# Patient Record
Sex: Female | Born: 1945 | Race: White | Hispanic: No | Marital: Married | Smoking: Former smoker
Health system: Southern US, Community
[De-identification: ages and names within clinical notes are randomized; demographics above are authoritative.]

## PROBLEM LIST (undated history)

## (undated) DIAGNOSIS — J45909 Unspecified asthma, uncomplicated: Secondary | ICD-10-CM

## (undated) DIAGNOSIS — E039 Hypothyroidism, unspecified: Secondary | ICD-10-CM

## (undated) DIAGNOSIS — C801 Malignant (primary) neoplasm, unspecified: Secondary | ICD-10-CM

## (undated) DIAGNOSIS — I480 Paroxysmal atrial fibrillation: Secondary | ICD-10-CM

## (undated) DIAGNOSIS — T8859XA Other complications of anesthesia, initial encounter: Secondary | ICD-10-CM

## (undated) DIAGNOSIS — D649 Anemia, unspecified: Secondary | ICD-10-CM

## (undated) DIAGNOSIS — Z9889 Other specified postprocedural states: Secondary | ICD-10-CM

## (undated) DIAGNOSIS — I499 Cardiac arrhythmia, unspecified: Secondary | ICD-10-CM

## (undated) DIAGNOSIS — R030 Elevated blood-pressure reading, without diagnosis of hypertension: Secondary | ICD-10-CM

## (undated) DIAGNOSIS — I10 Essential (primary) hypertension: Secondary | ICD-10-CM

## (undated) DIAGNOSIS — E785 Hyperlipidemia, unspecified: Secondary | ICD-10-CM

## (undated) DIAGNOSIS — T884XXA Failed or difficult intubation, initial encounter: Secondary | ICD-10-CM

## (undated) HISTORY — PX: KNEE SURGERY: SHX244

## (undated) HISTORY — PX: RIGHT COLECTOMY: SHX853

## (undated) HISTORY — DX: Hyperlipidemia, unspecified: E78.5

## (undated) HISTORY — DX: Elevated blood-pressure reading, without diagnosis of hypertension: R03.0

## (undated) HISTORY — PX: LEG SURGERY: SHX1003

## (undated) HISTORY — DX: Paroxysmal atrial fibrillation: I48.0

## (undated) HISTORY — PX: CHOLECYSTECTOMY: SHX55

## (undated) HISTORY — PX: THYROID SURGERY: SHX805

## (undated) HISTORY — DX: Hypothyroidism, unspecified: E03.9

---

## 1997-07-25 ENCOUNTER — Ambulatory Visit (HOSPITAL_COMMUNITY): Admission: RE | Admit: 1997-07-25 | Discharge: 1997-07-26 | Payer: Self-pay | Admitting: General Surgery

## 1997-07-27 ENCOUNTER — Inpatient Hospital Stay (HOSPITAL_COMMUNITY): Admission: EM | Admit: 1997-07-27 | Discharge: 1997-07-29 | Payer: Self-pay | Admitting: Emergency Medicine

## 1997-09-09 ENCOUNTER — Ambulatory Visit (HOSPITAL_COMMUNITY): Admission: RE | Admit: 1997-09-09 | Discharge: 1997-09-09 | Payer: Self-pay | Admitting: Obstetrics & Gynecology

## 2000-03-13 ENCOUNTER — Encounter: Payer: Self-pay | Admitting: Obstetrics and Gynecology

## 2000-03-13 ENCOUNTER — Encounter: Admission: RE | Admit: 2000-03-13 | Discharge: 2000-03-13 | Payer: Self-pay | Admitting: Obstetrics and Gynecology

## 2000-04-30 ENCOUNTER — Other Ambulatory Visit: Admission: RE | Admit: 2000-04-30 | Discharge: 2000-04-30 | Payer: Self-pay | Admitting: Obstetrics and Gynecology

## 2002-02-10 ENCOUNTER — Emergency Department (HOSPITAL_COMMUNITY): Admission: EM | Admit: 2002-02-10 | Discharge: 2002-02-10 | Payer: Self-pay | Admitting: Emergency Medicine

## 2002-02-20 ENCOUNTER — Emergency Department (HOSPITAL_COMMUNITY): Admission: EM | Admit: 2002-02-20 | Discharge: 2002-02-20 | Payer: Self-pay | Admitting: Emergency Medicine

## 2002-02-20 ENCOUNTER — Encounter: Payer: Self-pay | Admitting: Emergency Medicine

## 2002-04-23 ENCOUNTER — Emergency Department (HOSPITAL_COMMUNITY): Admission: EM | Admit: 2002-04-23 | Discharge: 2002-04-23 | Payer: Self-pay | Admitting: Emergency Medicine

## 2002-06-12 ENCOUNTER — Inpatient Hospital Stay (HOSPITAL_COMMUNITY): Admission: AD | Admit: 2002-06-12 | Discharge: 2002-06-12 | Payer: Self-pay | Admitting: Obstetrics and Gynecology

## 2002-10-25 ENCOUNTER — Other Ambulatory Visit: Admission: RE | Admit: 2002-10-25 | Discharge: 2002-10-25 | Payer: Self-pay | Admitting: Obstetrics and Gynecology

## 2002-11-18 ENCOUNTER — Encounter: Admission: RE | Admit: 2002-11-18 | Discharge: 2002-11-18 | Payer: Self-pay | Admitting: Obstetrics and Gynecology

## 2002-11-18 ENCOUNTER — Encounter: Payer: Self-pay | Admitting: Obstetrics and Gynecology

## 2003-06-16 ENCOUNTER — Emergency Department (HOSPITAL_COMMUNITY): Admission: AD | Admit: 2003-06-16 | Discharge: 2003-06-16 | Payer: Self-pay | Admitting: Emergency Medicine

## 2003-10-29 ENCOUNTER — Inpatient Hospital Stay (HOSPITAL_COMMUNITY): Admission: EM | Admit: 2003-10-29 | Discharge: 2003-11-02 | Payer: Self-pay | Admitting: *Deleted

## 2003-10-31 ENCOUNTER — Encounter: Payer: Self-pay | Admitting: Cardiology

## 2004-03-02 ENCOUNTER — Emergency Department (HOSPITAL_COMMUNITY): Admission: EM | Admit: 2004-03-02 | Discharge: 2004-03-02 | Payer: Self-pay | Admitting: Emergency Medicine

## 2004-05-04 ENCOUNTER — Emergency Department (HOSPITAL_COMMUNITY): Admission: EM | Admit: 2004-05-04 | Discharge: 2004-05-04 | Payer: Self-pay | Admitting: Emergency Medicine

## 2004-05-07 ENCOUNTER — Ambulatory Visit: Payer: Self-pay | Admitting: Cardiology

## 2004-06-21 ENCOUNTER — Ambulatory Visit: Payer: Self-pay | Admitting: Cardiology

## 2004-07-18 ENCOUNTER — Other Ambulatory Visit: Admission: RE | Admit: 2004-07-18 | Discharge: 2004-07-18 | Payer: Self-pay | Admitting: Obstetrics and Gynecology

## 2004-07-25 ENCOUNTER — Encounter: Admission: RE | Admit: 2004-07-25 | Discharge: 2004-07-25 | Payer: Self-pay | Admitting: Obstetrics and Gynecology

## 2005-03-10 ENCOUNTER — Emergency Department (HOSPITAL_COMMUNITY): Admission: EM | Admit: 2005-03-10 | Discharge: 2005-03-10 | Payer: Self-pay | Admitting: Emergency Medicine

## 2005-07-29 ENCOUNTER — Encounter: Admission: RE | Admit: 2005-07-29 | Discharge: 2005-07-29 | Payer: Self-pay | Admitting: Obstetrics and Gynecology

## 2005-08-06 ENCOUNTER — Other Ambulatory Visit: Admission: RE | Admit: 2005-08-06 | Discharge: 2005-08-06 | Payer: Self-pay | Admitting: Obstetrics and Gynecology

## 2005-08-19 ENCOUNTER — Encounter: Admission: RE | Admit: 2005-08-19 | Discharge: 2005-08-19 | Payer: Self-pay | Admitting: Obstetrics and Gynecology

## 2005-11-12 ENCOUNTER — Ambulatory Visit: Payer: Self-pay | Admitting: Internal Medicine

## 2005-11-20 ENCOUNTER — Ambulatory Visit: Payer: Self-pay | Admitting: Internal Medicine

## 2005-11-28 ENCOUNTER — Ambulatory Visit: Payer: Self-pay | Admitting: Internal Medicine

## 2005-12-10 ENCOUNTER — Ambulatory Visit: Payer: Self-pay | Admitting: Cardiovascular Disease

## 2005-12-17 ENCOUNTER — Ambulatory Visit: Payer: Self-pay | Admitting: Gastroenterology

## 2006-01-01 ENCOUNTER — Ambulatory Visit: Payer: Self-pay | Admitting: Gastroenterology

## 2006-08-14 ENCOUNTER — Encounter: Admission: RE | Admit: 2006-08-14 | Discharge: 2006-08-14 | Payer: Self-pay | Admitting: Orthopaedic Surgery

## 2006-09-24 ENCOUNTER — Other Ambulatory Visit: Admission: RE | Admit: 2006-09-24 | Discharge: 2006-09-24 | Payer: Self-pay | Admitting: Obstetrics and Gynecology

## 2006-10-29 ENCOUNTER — Encounter: Admission: RE | Admit: 2006-10-29 | Discharge: 2006-10-29 | Payer: Self-pay | Admitting: Obstetrics and Gynecology

## 2007-11-17 ENCOUNTER — Encounter: Admission: RE | Admit: 2007-11-17 | Discharge: 2007-11-17 | Payer: Self-pay | Admitting: Obstetrics and Gynecology

## 2007-12-12 ENCOUNTER — Emergency Department (HOSPITAL_COMMUNITY): Admission: EM | Admit: 2007-12-12 | Discharge: 2007-12-12 | Payer: Self-pay | Admitting: Emergency Medicine

## 2008-01-15 ENCOUNTER — Ambulatory Visit: Payer: Self-pay | Admitting: Cardiology

## 2008-01-15 LAB — CONVERTED CEMR LAB
BUN: 12 mg/dL (ref 6–23)
Basophils Absolute: 0.1 10*3/uL (ref 0.0–0.1)
Basophils Relative: 0.9 % (ref 0.0–3.0)
CO2: 31 meq/L (ref 19–32)
Calcium: 9 mg/dL (ref 8.4–10.5)
Chloride: 105 meq/L (ref 96–112)
Creatinine, Ser: 0.7 mg/dL (ref 0.4–1.2)
Eosinophils Absolute: 0.2 10*3/uL (ref 0.0–0.7)
Eosinophils Relative: 1.8 % (ref 0.0–5.0)
GFR calc Af Amer: 110 mL/min
GFR calc non Af Amer: 91 mL/min
Glucose, Bld: 94 mg/dL (ref 70–99)
HCT: 39.4 % (ref 36.0–46.0)
Hemoglobin: 13 g/dL (ref 12.0–15.0)
Lymphocytes Relative: 34.9 % (ref 12.0–46.0)
MCHC: 33 g/dL (ref 30.0–36.0)
MCV: 90.5 fL (ref 78.0–100.0)
Monocytes Absolute: 0.6 10*3/uL (ref 0.1–1.0)
Monocytes Relative: 7.5 % (ref 3.0–12.0)
Neutro Abs: 4.7 10*3/uL (ref 1.4–7.7)
Neutrophils Relative %: 54.9 % (ref 43.0–77.0)
Platelets: 242 10*3/uL (ref 150–400)
Potassium: 4.1 meq/L (ref 3.5–5.1)
Pro B Natriuretic peptide (BNP): 122 pg/mL — ABNORMAL HIGH (ref 0.0–100.0)
RBC: 4.35 M/uL (ref 3.87–5.11)
RDW: 13 % (ref 11.5–14.6)
Sodium: 141 meq/L (ref 135–145)
TSH: 2.08 microintl units/mL (ref 0.35–5.50)
WBC: 8.6 10*3/uL (ref 4.5–10.5)

## 2008-01-20 ENCOUNTER — Ambulatory Visit: Payer: Self-pay

## 2008-05-26 ENCOUNTER — Ambulatory Visit: Payer: Self-pay | Admitting: Cardiology

## 2008-05-27 ENCOUNTER — Observation Stay (HOSPITAL_COMMUNITY): Admission: EM | Admit: 2008-05-27 | Discharge: 2008-05-27 | Payer: Self-pay | Admitting: Emergency Medicine

## 2008-06-15 ENCOUNTER — Ambulatory Visit: Payer: Self-pay | Admitting: Cardiology

## 2008-09-15 DIAGNOSIS — I10 Essential (primary) hypertension: Secondary | ICD-10-CM | POA: Insufficient documentation

## 2008-09-15 DIAGNOSIS — E039 Hypothyroidism, unspecified: Secondary | ICD-10-CM | POA: Insufficient documentation

## 2008-09-15 DIAGNOSIS — E785 Hyperlipidemia, unspecified: Secondary | ICD-10-CM | POA: Insufficient documentation

## 2008-09-15 DIAGNOSIS — E782 Mixed hyperlipidemia: Secondary | ICD-10-CM | POA: Insufficient documentation

## 2008-09-15 DIAGNOSIS — I480 Paroxysmal atrial fibrillation: Secondary | ICD-10-CM | POA: Insufficient documentation

## 2008-09-15 DIAGNOSIS — I4891 Unspecified atrial fibrillation: Secondary | ICD-10-CM | POA: Insufficient documentation

## 2008-09-15 HISTORY — DX: Hypothyroidism, unspecified: E03.9

## 2008-09-16 ENCOUNTER — Ambulatory Visit: Payer: Self-pay | Admitting: Cardiology

## 2008-09-16 DIAGNOSIS — E669 Obesity, unspecified: Secondary | ICD-10-CM | POA: Insufficient documentation

## 2009-03-03 ENCOUNTER — Encounter: Admission: RE | Admit: 2009-03-03 | Discharge: 2009-03-03 | Payer: Self-pay | Admitting: Obstetrics and Gynecology

## 2009-04-24 ENCOUNTER — Other Ambulatory Visit: Admission: RE | Admit: 2009-04-24 | Discharge: 2009-04-24 | Payer: Self-pay | Admitting: Obstetrics and Gynecology

## 2009-04-24 ENCOUNTER — Ambulatory Visit: Payer: Self-pay | Admitting: Obstetrics and Gynecology

## 2009-07-31 ENCOUNTER — Ambulatory Visit: Payer: Self-pay | Admitting: Vascular Surgery

## 2009-07-31 ENCOUNTER — Emergency Department (HOSPITAL_COMMUNITY): Admission: EM | Admit: 2009-07-31 | Discharge: 2009-07-31 | Payer: Self-pay | Admitting: Emergency Medicine

## 2009-07-31 ENCOUNTER — Ambulatory Visit (HOSPITAL_COMMUNITY): Admission: RE | Admit: 2009-07-31 | Discharge: 2009-07-31 | Payer: Self-pay | Admitting: Emergency Medicine

## 2009-07-31 ENCOUNTER — Telehealth: Payer: Self-pay | Admitting: Cardiology

## 2009-07-31 ENCOUNTER — Encounter (INDEPENDENT_AMBULATORY_CARE_PROVIDER_SITE_OTHER): Payer: Self-pay | Admitting: Emergency Medicine

## 2009-09-18 ENCOUNTER — Ambulatory Visit: Payer: Self-pay | Admitting: Cardiology

## 2009-10-12 ENCOUNTER — Ambulatory Visit (HOSPITAL_COMMUNITY): Admission: RE | Admit: 2009-10-12 | Discharge: 2009-10-12 | Payer: Self-pay | Admitting: Cardiology

## 2009-10-12 ENCOUNTER — Encounter: Payer: Self-pay | Admitting: Cardiology

## 2009-10-12 ENCOUNTER — Ambulatory Visit: Payer: Self-pay | Admitting: Internal Medicine

## 2009-10-12 ENCOUNTER — Ambulatory Visit: Payer: Self-pay

## 2009-12-19 ENCOUNTER — Ambulatory Visit: Payer: Self-pay

## 2009-12-19 ENCOUNTER — Encounter: Payer: Self-pay | Admitting: Cardiology

## 2009-12-19 ENCOUNTER — Telehealth: Payer: Self-pay | Admitting: Cardiology

## 2009-12-19 DIAGNOSIS — R000 Tachycardia, unspecified: Secondary | ICD-10-CM | POA: Insufficient documentation

## 2009-12-21 ENCOUNTER — Telehealth (INDEPENDENT_AMBULATORY_CARE_PROVIDER_SITE_OTHER): Payer: Self-pay | Admitting: *Deleted

## 2010-01-30 ENCOUNTER — Telehealth (INDEPENDENT_AMBULATORY_CARE_PROVIDER_SITE_OTHER): Payer: Self-pay | Admitting: *Deleted

## 2010-03-06 ENCOUNTER — Encounter: Admission: RE | Admit: 2010-03-06 | Discharge: 2010-03-06 | Payer: Self-pay | Admitting: Obstetrics and Gynecology

## 2010-05-01 ENCOUNTER — Telehealth: Payer: Self-pay | Admitting: Cardiology

## 2010-05-04 ENCOUNTER — Telehealth: Payer: Self-pay | Admitting: Internal Medicine

## 2010-05-04 DIAGNOSIS — J302 Other seasonal allergic rhinitis: Secondary | ICD-10-CM | POA: Insufficient documentation

## 2010-05-04 DIAGNOSIS — J3089 Other allergic rhinitis: Secondary | ICD-10-CM

## 2010-05-08 ENCOUNTER — Encounter (INDEPENDENT_AMBULATORY_CARE_PROVIDER_SITE_OTHER): Payer: Self-pay | Admitting: *Deleted

## 2010-05-08 NOTE — Progress Notes (Signed)
Summary: refill--metoprolol  Medications Added METOPROLOL TARTRATE 25 MG TABS (METOPROLOL TARTRATE) Take 1 tablet by mouth twice a day       Phone Note Refill Request Message from:  Patient on July 31, 2009 9:11 AM  Refills Requested: Medication #1:  METOPROLOL TARTRATE 25 MG TABS Take 1 tablet by mouth twice a day Send to [facility] [phone]  Initial call taken by: Judie Grieve,  July 31, 2009 9:11 AM  Follow-up for Phone Call        Rx sent into pharmacy. Pt notified. Marrion Coy, CNA  July 31, 2009 2:35 PM  Follow-up by: Marrion Coy, CNA,  July 31, 2009 2:35 PM    New/Updated Medications: METOPROLOL TARTRATE 25 MG TABS (METOPROLOL TARTRATE) Take 1 tablet by mouth twice a day Prescriptions: METOPROLOL TARTRATE 25 MG TABS (METOPROLOL TARTRATE) Take 1 tablet by mouth twice a day  #60 x 6   Entered by:   Marrion Coy, CNA   Authorized by:   Rollene Rotunda, MD, [facility]   Signed by:   Marrion Coy, CNA on 07/31/2009   Method used:   Electronically to        [facility] [phone]* (retail)       [address]       [place], [place]  [address]       Ph: [phone]       Fax: [phone]   RxID:   [id number]

## 2010-05-08 NOTE — Assessment & Plan Note (Signed)
Summary: f38m  Medications Added PRILOSEC 20 MG CPDR (OMEPRAZOLE) daily ASPIRIN 325 MG  TABS (ASPIRIN) daily      Allergies Added: ! PENICILLIN ! * ARYTHROMYCIN ! PHENERGAN ! * ALEVE  Visit Type:  Follow-up Primary Provider:  Oliver Barre, MD  CC:  Atrial Fibrillation.  History of Present Illness: The patient presents for follow up of her atrial fibrillation.  Since I last saw her she has had no symptomatic tachypalpitations. She has had no presyncope or syncope. She denies any chest pain or shortness of breath. She has no PND or orthopnea. She does some walking for exercise and denies any cardiovascular symptoms. Of note her blood pressure was elevated at the last appointment but as reported below is well-controlled today. She remains on the medications as listed.  Current Medications (verified): 1)  Prilosec 20 Mg Cpdr (Omeprazole) .... Daily 2)  Metoprolol Tartrate 25 Mg Tabs (Metoprolol Tartrate) .... Take 1 Tablet By Mouth Twice A Day 3)  Aspirin 325 Mg  Tabs (Aspirin) .... Daily  Allergies (verified): 1)  ! Penicillin 2)  ! * Arythromycin 3)  ! Phenergan 4)  ! * Aleve  Past History:  Past Medical History: Reviewed history from 09/15/2008 and no changes required.  1. Paroxysmal atrial fibrillation.   2. Borderline hypertension.   3. Borderline dyslipidemia.   4. History of hypothyroidism status post PTU therapy in the past.   5. Thyroid surgery.   6. Cholecystectomy.   7. Hysterectomy.   8. Benign left leg tumor resected.   9. Cesarean section.   Past Surgical History: Reviewed history from 09/15/2008 and no changes required.  Thyroid surgery  Cholecystectomy  Hysterectomy.   Benign left leg tumor resected  Cesarean section  Review of Systems       As stated in the HPI and negative for all other systems.   Vital Signs:  Patient profile:   65 year old female Height:      60 inches Weight:      226 pounds BMI:     44.30 Pulse rate:   48 / minute Resp:      16 per minute BP sitting:   110 / 70  (right arm)  Vitals Entered By: Marrion Coy, CNA (September 16, 2008 10:12 AM)  Nutrition Counseling: Patient's BMI is greater than 25 and therefore counseled on weight management options.  Physical Exam  General:  Well developed, well nourished, in no acute distress. Head:  normocephalic and atraumatic Eyes:  PERRLA/EOM intact; conjunctiva and lids normal. Mouth:  Teeth, gums and palate normal. Oral mucosa normal. Neck:  Neck supple, no JVD. No masses, thyromegaly or abnormal cervical nodes. Chest Wall:  no deformities or breast masses noted Lungs:  Clear bilaterally to auscultation and percussion. Heart:  Non-displaced PMI, chest non-tender; regular rate and rhythm, S1, S2 without murmurs, rubs or gallops. Carotid upstroke normal, no bruit. Normal abdominal aortic size, no bruits. Femorals normal pulses, no bruits. Pedals normal pulses. No edema, no varicosities. Abdomen:  Bowel sounds positive; abdomen soft and non-tender without masses, organomegaly, or hernias noted. No hepatosplenomegaly,obese Msk:  Back normal, normal gait. Muscle strength and tone normal. Pulses:  pulses normal in all 4 extremities Extremities:  No clubbing or cyanosis. Neurologic:  Alert and oriented x 3. Skin:  Intact without lesions or rashes. Psych:  Normal affect.   EKG  Procedure date:  09/16/2008  Findings:      Sinus bradycardia, rate 48, axis within normal limits, intervals within normal  limits, no acute ST-T wave changes.  Impression & Recommendations:  Problem # 1:  ATRIAL FIBRILLATION, PAROXYSMAL (ICD-427.31) The patient has had no symptomatic paroxysms of A. fib. She understands that if she does she needs to present to the emergency room for a flecainide bolus (pill in pocket). She will otherwise remain on the meds as listed. Orders: EKG w/ Interpretation (93000)  Problem # 2:  HYPERTENSION (ICD-401.9) Her blood pressure is well controlled. She will  continue the meds as listed.  Problem # 3:  OBESITY, UNSPECIFIED (ICD-278.00) We had a discussion today and the patient understands the need to lose weight. She says she does not eat very many calories. I gave her some suggestions to include increased exercise.  Patient Instructions: 1)  Your physician recommends that you schedule a follow-up appointment in: 1 year 2)  Your physician recommends that you continue on your current medications as directed. Please refer to the Current Medication list given to you today.

## 2010-05-08 NOTE — Progress Notes (Signed)
Summary: Low B/P  COMING FOR EKG   Phone Note Call from Patient Call back at Home Phone [phone]   Caller: Patient Summary of Call: Pt B/P low 80/54 last night  Initial call taken by: Judie Grieve,  December 19, 2009 9:32 AM  Follow-up for Phone Call        took BP several times during the night,  heart was beating like crazy  99 - 105.  IRREGULAR  but not racing, this irregularity is not normal for her. wakes her up from her sleep She had taken Metoprolol as ordered having some dizziness today with position changes BP 113/64 now.  Will review with Dr Antoine Poche and call pt back with orders Follow-up by: Charolotte Capuchin, RN,  December 19, 2009 10:06 AM  Additional Follow-up for Phone Call Additional follow up Details #1::        Have the patient come in for an EKG. Additional Follow-up by: Rollene Rotunda, MD, [facility],  December 19, 2009 2:03 PM    Additional Follow-up for Phone Call Additional follow up Details #2::    pt aware and is coming for an EKG today.  Samuella Bruin, RN

## 2010-05-08 NOTE — Progress Notes (Signed)
Summary: monitor   Phone Note Outgoing Call   Action Taken: Phone Call Completed Summary of Call: The event monitor will be mail to pt by [employer] Initial call taken by: Marcos Eke,  December 21, 2009 12:38 PM

## 2010-05-08 NOTE — Progress Notes (Signed)
Summary: Monitor Results   Phone Note Outgoing Call   Call placed by: Charolotte Capuchin, RN,  January 30, 2010 5:36 PM Call placed to: Patient Details for Reason: monitor results Summary of Call: Called to review results of monitor with pt.  left message for pt to call back.  Results are noted to be NSR, Sinus Huston Foley Initial call taken by: Charolotte Capuchin, RN,  January 30, 2010 5:37 PM  Follow-up for Phone Call        pt rtn call from yesterday [phone] [name]  January 31, 2010 2:35 PM   pt aware Follow-up by: Charolotte Capuchin, RN,  January 31, 2010 5:26 PM

## 2010-05-08 NOTE — Assessment & Plan Note (Signed)
Summary: 1 yr rov 427.31 pfh,rn      Allergies Added:   Visit Type:  Follow-up Primary Provider:  Oliver Barre, MD  CC:   atrial fibrillation.  History of Present Illness:  The patient presents for followup of atrial fibrillation. She was in the hospital emergency room in late April with a rate of 153. I think a arrhythmia stopped before they can capture it on an EKG. I reviewed the ER records. It was presumably atrial fibrillation. This is her second episode since February. She feels it going fast. She is anxious with that. She doesn't have chest discomfort, neck or arm discomfort. She doesn't have shortness of breath, PND or orthopnea. She doesn't have presyncope or syncope.  She has had no further episodes since that time. I did review her February hospitalization and noted her TSH was normal then.  Current Medications (verified): 1)  Prilosec 20 Mg Cpdr (Omeprazole) .... Daily 2)  Metoprolol Tartrate 25 Mg Tabs (Metoprolol Tartrate) .... Take 1 Tablet By Mouth Twice A Day 3)  Aspirin 325 Mg  Tabs (Aspirin) .... Daily  Allergies (verified): 1)  ! Penicillin 2)  ! * Arythromycin 3)  ! Phenergan 4)  ! * Aleve  Past History:  Past Medical History: Reviewed history from 09/15/2008 and no changes required.  1. Paroxysmal atrial fibrillation.   2. Borderline hypertension.   3. Borderline dyslipidemia.   4. History of hypothyroidism status post PTU therapy in the past.   5. Thyroid surgery.   6. Cholecystectomy.   7. Hysterectomy.   8. Benign left leg tumor resected.   9. Cesarean section.   Past Surgical History: Reviewed history from 09/15/2008 and no changes required.  Thyroid surgery  Cholecystectomy  Hysterectomy.   Benign left leg tumor resected  Cesarean section  Review of Systems       As stated in the HPI and negative for all other systems.   Vital Signs:  Patient profile:   65 year old female Height:      60 inches Weight:      226 pounds BMI:      44.30 Pulse rate:   53 / minute Resp:     18 per minute BP sitting:   144 / 82  (right arm)  Vitals Entered By: Marrion Coy, CNA (September 18, 2009 11:38 AM)  Physical Exam  General:  Well developed, well nourished, in no acute distress. Head:  normocephalic and atraumatic Eyes:  PERRLA/EOM intact; conjunctiva and lids normal. Mouth:  Dentures, otherwise unremarkable Neck:  Neck supple, no JVD. No masses, thyromegaly or abnormal cervical nodes. Chest Wall:  no deformities or breast masses noted Lungs:  Clear bilaterally to auscultation and percussion. Heart:  Non-displaced PMI, chest non-tender; regular rate and rhythm, S1, S2 without murmurs, rubs or gallops. Carotid upstroke normal, no bruit. Normal abdominal aortic size, no bruits. Femorals normal pulses, no bruits. Pedals normal pulses. No edema, no varicosities. Abdomen:  Bowel sounds positive; abdomen soft and non-tender without masses, organomegaly, or hernias noted. No hepatosplenomegaly. Msk:  Back normal, normal gait. Muscle strength and tone normal. Pulses:  pulses normal in all 4 extremities Extremities:  No clubbing or cyanosis. Neurologic:  Alert and oriented x 3. Skin:  Intact without lesions or rashes. Cervical Nodes:  no significant adenopathy Axillary Nodes:  no significant adenopathy Inguinal Nodes:  no significant adenopathy Psych:  Normal affect.   EKG  Procedure date:  09/18/2009  Findings:      Sinus bradycardia, rate  51, axis within normal limits, intervals within normal limits, no acute ST-T wave changes.  Impression & Recommendations:  Problem # 1:  ATRIAL FIBRILLATION, PAROXYSMAL (ICD-427.31) She has had her now second paroxysm. I do not think this yet justifies antiarrhythmic therapy and we had a long discussion about this. I will get an echocardiogram in anticipation of potentially starting Flecainide in the future if she has another episode. She has had a stress test in 2009 and a catheterization in  2005. As mentioned TSH was normal in February. Orders: EKG w/ Interpretation (93000) Echocardiogram (Echo)  Problem # 2:  OBESITY, UNSPECIFIED (ICD-278.00) She understands the need to lose weight with diet and exercise.  Problem # 3:  HYPERTENSION (ICD-401.9) Blood pressure has not been elevated otherwise though it is borderline here today. I encouraged weight loss for blood pressure control. Orders: Echocardiogram (Echo)  Patient Instructions: 1)  Your physician recommends that you schedule a follow-up appointment after echo (we will call you) 2)  Your physician recommends that you continue on your current medications as directed. Please refer to the Current Medication list given to you today. 3)  Your physician has requested that you have an echocardiogram.  Echocardiography is a painless test that uses sound waves to create images of your heart. It provides your doctor with information about the size and shape of your heart and how well your heart's chambers and valves are working.  This procedure takes approximately one hour. There are no restrictions for this procedure. 4)  You have been diagnosed with atrial fibrillation.  Atrial fibrillation is a condition in which one of the upper chambers of the heart has extra electrical cells causing it to beat very fast.  Please see the handout/brochure given to you today for further information.

## 2010-05-08 NOTE — Assessment & Plan Note (Signed)
Summary: ekg  rapid heartbeat last night order for event monitor  pfh,rn  Nurse Visit   Vital Signs:  Patient profile:   65 year old female Pulse rate:   56 / minute Pulse rhythm:   regular Resp:     16 per minute BP sitting:   128 / 82  (right arm) Cuff size:   large  Vitals Entered By: Charolotte Capuchin, RN (December 19, 2009 3:19 PM) CC: irregular HB Pain Assessment Patient in pain? no      Comments pt had episode of racing heart beat last night that resolved on its own.  Per Dr Rollene Rotunda pt came in today for an EKG - sinus bradycardia 56.  pt is concerned that with her history she may be going in and out of A Fib.  Will review with Dr Antoine Poche and call pt with an orders.  Pt is agreeable and states understanding.  Reviewed with Dr Antoine Poche who ordered for pt to have an event monitor.  Pt will be called to have  this scheduled   Allergies: 1)  ! Penicillin 2)  ! * Arythromycin 3)  ! Phenergan 4)  ! * Aleve  Orders Added: 1)  EKG w/ Interpretation [93000] 2)  Event [Event]

## 2010-05-16 NOTE — Progress Notes (Signed)
Summary: pt calling to get and answer to her question   Phone Note Call from Patient Call back at Home Phone [phone] Call back at [phone]   Caller: Patient Reason for Call: Talk to Nurse, Talk to Doctor Summary of Call: The question she asked you she has not heard from you yet so she was calling to check and see if you had an answer yet Initial call taken by: Omer Jack,  May 04, 2010 3:07 PM  Follow-up for Phone Call        This should be addressed by her primary care physician. Follow-up by: Rollene Rotunda, MD, [facility],  May 07, 2010 2:58 PM  Additional Follow-up for Phone Call Additional follow up Details #1::        pt aware she should have this addressed by her primary care MD.  I will forward this information to Dr Oliver Barre for his review Additional Follow-up by: Charolotte Capuchin, RN,  May 07, 2010 3:32 PM  New Problems: ALLERGIC RHINITIS (ICD-477.9)   Additional Follow-up for Phone Call Additional follow up Details #2::    sorry, but I would not feel comfortable with this type of letter for work; I could refer her to allergy if pt wants Follow-up by: Corwin Levins MD,  May 07, 2010 5:04 PM  Additional Follow-up for Phone Call Additional follow up Details #3:: Details for Additional Follow-up Action Taken: left message on machine for pt to return my call. Margaret Pyle, CMA  May 08, 2010 10:49 AM   Pt is requesting referral to allergist [phone] or [phone] VM OK. Margaret Pyle, CMA  May 08, 2010 11:13 AM   New Problems: ALLERGIC RHINITIS (ICD-477.9)   referral done Corwin Levins MD  May 08, 2010 11:27 AM

## 2010-05-16 NOTE — Letter (Signed)
Summary: [facility] Consult Scheduled Letter  [facility]  [address] [place], [place] [address]   Phone: [phone]  Fax: [phone]      05/08/2010 MRN: [id number]  [facility] [address] [place], [place]  [address]    Dear Ms. Pia Mau,      We have scheduled an appointment for you.  At the recommendation of Dr.John, we have scheduled you a consult with Dr Maple Hudson on 06/12/10 at 10:30am.  Their phone number is [phone].  If this appointment day and time is not convenient for you, please feel free to call the office of the doctor you are being referred to at the number listed above and reschedule the appointment.     [facility] [address] [place], [place] [address] *Pulmonary Dept.2nd Floor*     Please give 24hr notice if you need to cancel/reschedule to avoid a $50.00 fee. Also bring insurance card and any co-pay due at time of visit    Thank you,  Patient Care Coordinator [facility]

## 2010-05-16 NOTE — Progress Notes (Signed)
Summary: pt needs letter re fish allergy sent to school  ASK [facility]   Phone Note Call from Patient   Caller: Patient [phone]  Reason for Call: Talk to Nurse Summary of Call: pt calling has question -pt works in school cafeteria-allergic to fish- needs statement re this-pls call  Initial call taken by: Glynda Jaeger,  May 01, 2010 1:59 PM  Follow-up for Phone Call        feels like her throat is going to close up from smelling it cook both  fish and/or shrimp.  was told before by Dr Lorenz Coaster years ago not to eat it anymore because she "broke out in hives".  Wants a note stating that she is allergic so that she doesn't have to be around it at work when they cook it. (therefore she will be out of work the days they cook fish at school)  Informed pt that I will have to discuss with Dr Antoine Poche if this would be OK or if he would prefer she go to an allergist. Follow-up by: Charolotte Capuchin, RN,  May 01, 2010 3:21 PM     Appended Document: pt needs letter re fish allergy sent to school  ASK [facility] needs to be addressed by her primary MD per Dr Antoine Poche

## 2010-06-12 ENCOUNTER — Encounter: Payer: Self-pay | Admitting: Internal Medicine

## 2010-06-12 ENCOUNTER — Other Ambulatory Visit: Payer: BC Managed Care – PPO

## 2010-06-12 ENCOUNTER — Encounter (INDEPENDENT_AMBULATORY_CARE_PROVIDER_SITE_OTHER): Payer: Self-pay | Admitting: *Deleted

## 2010-06-12 ENCOUNTER — Institutional Professional Consult (permissible substitution) (INDEPENDENT_AMBULATORY_CARE_PROVIDER_SITE_OTHER): Payer: BC Managed Care – PPO | Admitting: Internal Medicine

## 2010-06-12 DIAGNOSIS — T6191XA Toxic effect of unspecified seafood, accidental (unintentional), initial encounter: Secondary | ICD-10-CM

## 2010-06-12 DIAGNOSIS — J309 Allergic rhinitis, unspecified: Secondary | ICD-10-CM

## 2010-06-12 DIAGNOSIS — J45909 Unspecified asthma, uncomplicated: Secondary | ICD-10-CM

## 2010-06-12 DIAGNOSIS — T61781A Other shellfish poisoning, accidental (unintentional), initial encounter: Secondary | ICD-10-CM | POA: Insufficient documentation

## 2010-06-19 NOTE — Assessment & Plan Note (Signed)
Summary: allergies/mh   Primary Provider/Referring Provider:  Oliver Barre, MD  CC:  Allergy Consult-Dr. Jonny Ruiz.  History of Present Illness: June 12, 2010- 65 yoF referred by Dr Jonny Ruiz, concerned about possible food/ fish allergy. She worksas Scientist, physiological for [employer]. On days when fish/ seafood is served she notes symptoms. For several years as an adult, the odor of cooking fish makes her skin itch. Ate fried fish many years ago and broke out in hives. Throat may get tight, harder to swallow. Discomfort fades over 15-20 minutes. Avoids fish and will call in to get off work when fresh fish is being solved.  dogs cause itching, hives and wheeze. Told once she might have "a touch" of asthma.  Heavy pollen causes sneeze, eyes itch and water. Hx tacycardia. She is reluctant to carry Epipen.    Preventive Screening-Counseling & Management  Alcohol-Tobacco     Smoking Status: quit     Packs/Day: 1/2ppd     Year Started: 1977     Year Quit: 1984     Pack years: 7  Current Medications (verified): 1)  Tums 500 Mg Chew (Calcium Carbonate Antacid) .... Tkae 1-2 By Mouth At Bedtime 2)  Metoprolol Tartrate 25 Mg Tabs (Metoprolol Tartrate) .... Take 1 Tablet By Mouth Twice A Day 3)  Aspirin 325 Mg  Tabs (Aspirin) .... Daily  Allergies (verified): 1)  ! Penicillin 2)  ! * Arythromycin 3)  ! Phenergan 4)  ! * Aleve  Past History:  Past Medical History:  1. Paroxysmal atrial fibrillation.   2. Borderline hypertension.   3. Borderline dyslipidemia.   4. History of hypothyroidism status post PTU therapy in the past.   5. Thyroid surgery.   6. Cholecystectomy.   7. Hysterectomy.   8. Benign left leg tumor resected.   9. Cesarean section.  10, ?Food Allergy- seafood  Social History: Smoking Status:  quit Pack years:  7 Packs/Day:  1/2ppd  Review of Systems       The patient complains of irregular heartbeats, acid heartburn, indigestion, and hand/feet swelling.  The patient denies  shortness of breath with activity, shortness of breath at rest, productive cough, non-productive cough, coughing up blood, chest pain, loss of appetite, weight change, abdominal pain, difficulty swallowing, sore throat, tooth/dental problems, headaches, nasal congestion/difficulty breathing through nose, sneezing, itching, ear ache, anxiety, depression, joint stiffness or pain, rash, change in color of mucus, and fever.    Vital Signs:  Patient profile:   65 year old female Height:      60 inches Weight:      232.13 pounds BMI:     45.50 O2 Sat:      98 % on Room air Pulse rate:   50 / minute BP sitting:   134 / 80  (left arm) Cuff size:   large  Vitals Entered By: Reynaldo Minium CMA (June 12, 2010 10:58 AM)  O2 Flow:  Room air CC: Allergy Consult-Dr. Jonny Ruiz   Physical Exam  Additional Exam:  General: A/Ox3; pleasant and cooperative, NAD, overweight SKIN: no rash, lesions. No dermographism NODES: no lymphadenopathy HEENT: [place]/AT, EOM- WNL, Conjuctivae- clear, PERRLA, TM-WNL, Nose- clear, Throat- clear and wnl, dentures, Mallampati  II NECK: Supple w/ fair ROM, JVD- none, normal carotid impulses w/o bruits Thyroid- normal to palpation CHEST: Clear to P&A. 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 We will test for IgE mediated allergic responses in vitro today, with return for skin testing. She is concerned about Epipen use given her hx of tachyarryhthmia. Will suggest preventative antihistamine use and give pateint information on food allergy/ anaphyllaxis. Return for allergy testing.   Problem # 2:  ASTHMA (ICD-493.90) Hx of mild wheeze. Since  she was wheezing at this meeting, it is unclear she actually does measure her airflow.   Problem # 3:  ALLERGIC RHINITIS (ICD-477.9)  Orders: Consultation Level IV (21308)  Medications Added to Medication List This Visit: 1)  Tums 500 Mg Chew (Calcium carbonate antacid) .... Tkae 1-2 by mouth at bedtime  Other Orders: T-Allergy Profile Region [place], [place], [place], [place], [place] [phone]) T-Food Allergy Profile Specific IgE (86003/[id number])  Patient Instructions: 1)  Return as able for allergy skin testing. Stop all antihistamines 3 days before skin testing, including cold and allergy meds, otc sleep and cough meds.  2)  Except for the days leading up to allergy testing, you can try taking a daily antihistamine, like otc loratadine, as a little protection against unexpected exposures.  3)  Patient information- food allergy 4)  lab

## 2010-06-20 ENCOUNTER — Encounter: Payer: Self-pay | Admitting: Internal Medicine

## 2010-06-20 LAB — CONVERTED CEMR LAB
IgE (Immunoglobulin E), Serum: 1052 intl units/mL — ABNORMAL HIGH (ref 0.0–180.0)
IgE (Immunoglobulin E), Serum: 951.7 intl units/mL — ABNORMAL HIGH (ref 0.0–180.0)

## 2010-06-26 LAB — BASIC METABOLIC PANEL
BUN: 12 mg/dL (ref 6–23)
CO2: 29 mEq/L (ref 19–32)
Calcium: 8.8 mg/dL (ref 8.4–10.5)
Chloride: 105 mEq/L (ref 96–112)
Creatinine, Ser: 0.68 mg/dL (ref 0.4–1.2)
GFR calc Af Amer: >60 mL/min (ref >60)
GFR calc non Af Amer: >60 mL/min (ref >60)
Glucose, Bld: 112 mg/dL — ABNORMAL HIGH (ref 70–99)
Potassium: 3.3 mEq/L — ABNORMAL LOW (ref 3.5–5.1)
Sodium: 140 mEq/L (ref 135–145)

## 2010-06-26 LAB — POCT CARDIAC MARKERS
CKMB, poc: <1 ng/mL — ABNORMAL LOW (ref 1.0–8.0)
Myoglobin, poc: 62.1 ng/mL (ref 12–200)
Troponin i, poc: <0.05 ng/mL (ref 0.00–0.09)

## 2010-06-26 LAB — CBC
HCT: 38.4 % (ref 36.0–46.0)
Hemoglobin: 12.9 g/dL (ref 12.0–15.0)
MCHC: 33.6 g/dL (ref 30.0–36.0)
MCV: 90.7 fL (ref 78.0–100.0)
Platelets: 215 10*3/uL (ref 150–400)
RBC: 4.24 MIL/uL (ref 3.87–5.11)
RDW: 13.4 % (ref 11.5–15.5)
WBC: 11.6 10*3/uL — ABNORMAL HIGH (ref 4.0–10.5)

## 2010-06-26 LAB — D-DIMER, QUANTITATIVE (NOT AT ARMC): D-Dimer, Quant: 0.43 ug/mL-FEU (ref 0.00–0.48)

## 2010-06-26 LAB — DIFFERENTIAL
Basophils Absolute: 0.2 10*3/uL — ABNORMAL HIGH (ref 0.0–0.1)
Basophils Relative: 2 % — ABNORMAL HIGH (ref 0–1)
Eosinophils Absolute: 0.2 10*3/uL (ref 0.0–0.7)
Eosinophils Relative: 2 % (ref 0–5)
Lymphocytes Relative: 37 % (ref 12–46)
Lymphs Abs: 4.3 10*3/uL — ABNORMAL HIGH (ref 0.7–4.0)
Monocytes Absolute: 0.8 10*3/uL (ref 0.1–1.0)
Monocytes Relative: 7 % (ref 3–12)
Neutro Abs: 6.1 10*3/uL (ref 1.7–7.7)
Neutrophils Relative %: 53 % (ref 43–77)

## 2010-06-26 LAB — PROTIME-INR
INR: 0.98 (ref 0.00–1.49)
Prothrombin Time: 12.9 seconds (ref 11.6–15.2)

## 2010-06-26 LAB — APTT: aPTT: 29 seconds (ref 24–37)

## 2010-06-26 NOTE — Letter (Signed)
Summary: [facility]  [address]   [place], [place] [address]   Phone: [phone]  Fax: [phone]    06/20/2010  Saide Milone [address] [place], [place]  [address]  [place]   To Whom it may concern:     This letter is at the request of Mrs. Harron. She is allergic to Fish(all types) and needs to take precaution when around these particular foods. Please take this into consideration. If any questions or concerns please call our office at [phone].         Sincerely,       Jason Coop

## 2010-07-10 ENCOUNTER — Encounter: Payer: Self-pay | Admitting: Internal Medicine

## 2010-07-11 ENCOUNTER — Encounter: Payer: Self-pay | Admitting: Internal Medicine

## 2010-07-11 ENCOUNTER — Ambulatory Visit (INDEPENDENT_AMBULATORY_CARE_PROVIDER_SITE_OTHER): Payer: BC Managed Care – PPO | Admitting: Internal Medicine

## 2010-07-11 DIAGNOSIS — J309 Allergic rhinitis, unspecified: Secondary | ICD-10-CM

## 2010-07-11 DIAGNOSIS — J45909 Unspecified asthma, uncomplicated: Secondary | ICD-10-CM

## 2010-07-11 DIAGNOSIS — T6191XA Toxic effect of unspecified seafood, accidental (unintentional), initial encounter: Secondary | ICD-10-CM

## 2010-07-11 NOTE — Patient Instructions (Signed)
See [facility] to start application process for Xolair  Patient information sheet on Xolair  Continue to avoid exposure to fish/ seafood since you find they bother you.   You can take anihihistamines like loratadine or fexofenadine as needed  It may be best to avoid prolonged time outdoors now while pollen is so high.

## 2010-07-11 NOTE — Progress Notes (Signed)
  Subjective:    Patient ID: [name], female    DOB: 1946-03-10, 65 y.o.   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 Here today for allergy testing.  Allergy profile broadly positive including some fish/ shrimp.  Off anthiistamines she notes sneeze in early AM and at night- when she sits out on porch. Eyes itch, nose waters, postnasal drip. Wheezes a little at night, less than in past, but especially as she lies down.  Skin test- Pos grass, trees, dust, and fish, shell fish, and some incidental additional foods.  She got note and will stay out of work when fish served and she says this as helped a lot. Discussed allergy vaccine, xolair.  Review of Systems See HPI, otherwise negative today    Objective:   Physical Exam General- Alert, Oriented, Affect-appropriate, Distress- none acute  Skin- rash-none, lesions- none, excoriation- none  Lymphadenopathy- none  Head- atraumatic  Eyes- Gross vision intact, PERRLA, conjunctivae clear secretions  Ears- Normal- Hearing, canals, Tm L ,   R ,  Nose- clear mucus bridging, No- Septal dev, , polyps, erosion, perforation   Throat- Mallampati II , mucosa clear , drainage- none, tonsils- atrophic  Neck- flexible , trachea midline, no stridor , thyroid nl, carotid no bruit  Chest - symmetrical excursion , unlabored     Heart/CV- RRR , no murmur , no gallop  , no rub, nl s1 s2                     - JVD- none , edema- none, stasis changes- none, varices- none     Lung- clear to P&A, wheeze- none, cough- none , dullness-none, rub- none     Chest wall- atraumatic, no scar  Abd- tender-no, distended-no, bowel sounds-present, HSM- no  Br/ Gen/ Rectal- Not done, not indicated  Extrem- cyanosis- none, clubbing, none, atrophy- none, strength- nl  Neuro- grossly intact to  observation  overweight, neg    Try to bring note forward   Assessment & Plan:

## 2010-07-17 ENCOUNTER — Encounter: Payer: Self-pay | Admitting: Internal Medicine

## 2010-07-17 MED ORDER — OMALIZUMAB 150 MG ~~LOC~~ SOLR: SUBCUTANEOUS | Status: DC

## 2010-07-17 NOTE — Assessment & Plan Note (Signed)
Broad and high in vitro IgE levels and elevated food skin tests support recommendation that she avoid foods causing symptoms, carry an antihistamine. Consider Epipen. Keep a food diary to help recognize which exposures may not be significant.

## 2010-07-17 NOTE — Assessment & Plan Note (Addendum)
Mild imntermittent asthma, not needing rescue inhaler now. Gven her definite allergic component, we discussed possibility she might qualify for a trial of Xolair.

## 2010-07-17 NOTE — Assessment & Plan Note (Signed)
Educated on environmental dust and pollen control measures.

## 2010-07-24 LAB — ABO/RH: ABO/RH(D): O NEG

## 2010-07-24 LAB — TROPONIN I: Troponin I: <0.01 ng/mL (ref 0.00–0.06)

## 2010-07-24 LAB — CARDIAC PANEL(CRET KIN+CKTOT+MB+TROPI)
CK, MB: 1.1 ng/mL (ref 0.3–4.0)
Relative Index: INVALID (ref 0.0–2.5)
Total CK: 21 U/L (ref 7–177)
Troponin I: <0.01 ng/mL (ref 0.00–0.06)

## 2010-07-24 LAB — DIFFERENTIAL
Basophils Absolute: 0.1 10*3/uL (ref 0.0–0.1)
Basophils Relative: 1 % (ref 0–1)
Eosinophils Absolute: 0.2 10*3/uL (ref 0.0–0.7)
Eosinophils Relative: 2 % (ref 0–5)
Lymphocytes Relative: 33 % (ref 12–46)
Lymphs Abs: 3.1 10*3/uL (ref 0.7–4.0)
Monocytes Absolute: 0.8 10*3/uL (ref 0.1–1.0)
Monocytes Relative: 8 % (ref 3–12)
Neutro Abs: 5.4 10*3/uL (ref 1.7–7.7)
Neutrophils Relative %: 57 % (ref 43–77)

## 2010-07-24 LAB — POCT I-STAT, CHEM 8
BUN: 8 mg/dL (ref 6–23)
Calcium, Ion: 1.13 mmol/L (ref 1.12–1.32)
Chloride: 105 mEq/L (ref 96–112)
Creatinine, Ser: 0.5 mg/dL (ref 0.4–1.2)
Glucose, Bld: 115 mg/dL — ABNORMAL HIGH (ref 70–99)
HCT: 42 % (ref 36.0–46.0)
Hemoglobin: 14.3 g/dL (ref 12.0–15.0)
Potassium: 3.4 mEq/L — ABNORMAL LOW (ref 3.5–5.1)
Sodium: 144 mEq/L (ref 135–145)
TCO2: 30 mmol/L (ref 0–100)

## 2010-07-24 LAB — BASIC METABOLIC PANEL
BUN: 7 mg/dL (ref 6–23)
CO2: 29 mEq/L (ref 19–32)
Calcium: 8.3 mg/dL — ABNORMAL LOW (ref 8.4–10.5)
Chloride: 108 mEq/L (ref 96–112)
Creatinine, Ser: 0.69 mg/dL (ref 0.4–1.2)
GFR calc Af Amer: >60 mL/min (ref >60)
GFR calc non Af Amer: >60 mL/min (ref >60)
Glucose, Bld: 115 mg/dL — ABNORMAL HIGH (ref 70–99)
Potassium: 3.8 mEq/L (ref 3.5–5.1)
Sodium: 143 mEq/L (ref 135–145)

## 2010-07-24 LAB — CBC
HCT: 35.3 % — ABNORMAL LOW (ref 36.0–46.0)
HCT: 39.5 % (ref 36.0–46.0)
Hemoglobin: 12 g/dL (ref 12.0–15.0)
Hemoglobin: 13.4 g/dL (ref 12.0–15.0)
MCHC: 34.1 g/dL (ref 30.0–36.0)
MCHC: 34.1 g/dL (ref 30.0–36.0)
MCV: 89.8 fL (ref 78.0–100.0)
MCV: 89.9 fL (ref 78.0–100.0)
Platelets: 221 10*3/uL (ref 150–400)
Platelets: 264 10*3/uL (ref 150–400)
RBC: 3.93 MIL/uL (ref 3.87–5.11)
RBC: 4.39 MIL/uL (ref 3.87–5.11)
RDW: 14.4 % (ref 11.5–15.5)
RDW: 14.5 % (ref 11.5–15.5)
WBC: 8.7 10*3/uL (ref 4.0–10.5)
WBC: 9.6 10*3/uL (ref 4.0–10.5)

## 2010-07-24 LAB — TYPE AND SCREEN
ABO/RH(D): O NEG
Antibody Screen: NEGATIVE

## 2010-07-24 LAB — CK TOTAL AND CKMB (NOT AT ARMC)
CK, MB: 1.2 ng/mL (ref 0.3–4.0)
Relative Index: INVALID (ref 0.0–2.5)
Total CK: 24 U/L (ref 7–177)

## 2010-07-24 LAB — HEMOGLOBIN A1C
Hgb A1c MFr Bld: 6 % (ref 4.6–6.1)
Mean Plasma Glucose: 126 mg/dL

## 2010-07-24 LAB — HEPARIN LEVEL (UNFRACTIONATED): Heparin Unfractionated: 0.1 IU/mL — ABNORMAL LOW (ref 0.30–0.70)

## 2010-07-24 LAB — MAGNESIUM: Magnesium: 2.1 mg/dL (ref 1.5–2.5)

## 2010-07-24 LAB — LIPID PANEL
Cholesterol: 149 mg/dL (ref 0–200)
HDL: 51 mg/dL (ref >39)
LDL Cholesterol: 87 mg/dL (ref 0–99)
Total CHOL/HDL Ratio: 2.9 RATIO
Triglycerides: 54 mg/dL (ref <150)
VLDL: 11 mg/dL (ref 0–40)

## 2010-07-24 LAB — TSH: TSH: 3.484 u[IU]/mL (ref 0.350–4.500)

## 2010-07-24 LAB — PROTIME-INR
INR: 0.9 (ref 0.00–1.49)
Prothrombin Time: 12.7 seconds (ref 11.6–15.2)

## 2010-07-30 ENCOUNTER — Encounter: Payer: Self-pay | Admitting: Internal Medicine

## 2010-08-21 NOTE — Discharge Summary (Signed)
NAMEGIUSEPPINA, [name]              ACCOUNT NO.:  [id number]   MEDICAL RECORD NO.:  [id number]          PATIENT TYPE:  INP   LOCATION:  3707                         FACILITY:  [facility]   PHYSICIAN:  Bevelyn Buckles. Bensimhon, MDDATE OF BIRTH:  06/18/1945   DATE OF ADMISSION:  05/27/2008  DATE OF DISCHARGE:  05/27/2008                               DISCHARGE SUMMARY   PRIMARY CARDIOLOGIST:  Rollene Rotunda, MD, [facility]   PRIMARY CARE Tyresa Prindiville:  Corwin Levins, MD   DISCHARGE DIAGNOSIS:  Paroxysmal atrial fibrillation with rapid  ventricular response.   SECONDARY DIAGNOSES:  1. Borderline hyperlipidemia.  2. Borderline hypertension.  3. Status post cholecystectomy.  4. History of hypothyroidism status post PTU therapy 2005.  5. Status post hysterectomy.  6. Status post cesarean section.  7. 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   ALLERGIES:  PENICILLIN, NAPROSYN, PHENERGAN and ERYTHROMYCIN.   PROCEDURES:  None.   HISTORY OF PRESENT ILLNESS:  A 65 year old married Caucasian female with  prior history of paroxysmal atrial fibrillation who typically  experiences AFib two to three times per year.  She was in usual state of  health until the early morning hours of May 24, 2008 when she awoke  with recurrent tachy palpitations and mild chest and epigastric  discomfort which she associates with atrial fibrillation.  She presented  to the [facility] ED where she was in AFib with RVR at a rate of 144  beats per minute.  Prior to leaving her home, she took a 25 mg of oral  metoprolol and she was given additional 50 mg while in the emergency  room.  She was also initiated on diltiazem infusion with subsequent  conversion to sinus rhythm.  Since converting to sinus, she has been  asymptomatic and her rates have actually been in the 40s to 50s.  She  normally would only take 25 mg b.i.d. of metoprolol at home.  Her  cardiac markers are negative and I plan to discharge her home  today in  good condition.  With her history of hypothyroidism, we have checked TSH  however, it is pending.  We have arranged followup with Dr. Antoine Poche on  June 15, 2008 at 4 p.m. for consideration of flecainide therapy that is  felt to be appropriate.   DISCHARGE LABORATORY DATA:  Hemoglobin 14.3, hematocrit 42.0, WBC 9.6,  platelets 264, INR 0.9.  Sodium 143, potassium 3.8, chloride 108, CO2  29, BUN 7, creatinine 0.69, glucose 115, magnesium 2.1, calcium 8.3, CK  24, MB 1.2, troponin I less than 0.01, total cholesterol 149,  triglycerides 54, HDL 51, and LDL 87.   DISPOSITION:  The patient will be discharged home today in good  condition.   FOLLOWUP PLANS AND APPOINTMENTS:  She will follow up with Dr. Antoine Poche  on June 15, 2008 at 4 p.m.  We have asked her to follow up with Dr.  Jonny Ruiz as scheduled.   DISCHARGE MEDICATIONS:  1. Aspirin 325 mg daily.  2. Metoprolol 25 mg b.i.d.  3. Prevacid as previously prescribed.   OUTSTANDING LABORATORY STUDIES:  TSH is  pending.   DURATION OF DISCHARGE ENCOUNTER:  45 minutes including physician time.      Nicolasa Ducking, ANP      Bevelyn Buckles. Bensimhon, MD  Electronically Signed    CB/MEDQ  D:  05/27/2008  T:  05/28/2008  Job:  [id number]   cc:   Corwin Levins, MD

## 2010-08-21 NOTE — Assessment & Plan Note (Signed)
[facility]                            CARDIOLOGY OFFICE NOTE   MIETTE, [name]                     MRN:          [id number]  DATE:01/15/2008                            DOB:          08/16/1945    PRIMARY CARE PHYSICIAN:  Corwin Levins, MD   REASON FOR PRESENTATION:  Evaluate the patient with atrial fibrillation.   HISTORY OF PRESENT ILLNESS:  The patient is now 65 years old.  She is a  very pleasant white female.  I saw her greater than 3 years ago for  paroxysms of atrial fibrillation.  We managed these conservatively as  they were asymptomatic.  She did not require anticoagulation as she had  a [place] score of 0.   She has had probably 2 or 3 recurrences in the last 3 years.  She  presented to the emergency room twice.  The last was in early September.  At that point, she had atrial fibrillation that had persisted for  greater than an hour.  By the time she got to the emergency room and  then got back into the treatment area, she was in sinus rhythm.  She has  had no further paroxysms.  In retrospect, she had reduced her dose of  beta-blocker.  She is now back to taking the dose I had previously  prescribed at 25 mg twice a day.   When she did get this discomfort, she had some chest pressure.  She has  noticed chest pressure with activities such as walking a moderate  distance or going up a flight of stairs.  She has had progressive  dyspnea with this as well.  She not describe any resting PND or  orthopnea.  She has had occasional fleeting chest discomfort at rest.  She has kind of reduced her activity level because of this.  Of note,  when she was in the emergency room with her atrial fibrillation, she did  have lateral ST-segment depression.  Point of care marker x1 was  negative.   Of note, the patient has had previous cardiac workup.  Four years ago,  she had negative catheterization.  Her last stress test was in 1999 and  was  normal.   PAST MEDICAL HISTORY:  1. Borderline dyslipidemia.  2. Borderline hypertension.  3. Paroxysmal atrial fibrillation.   PAST SURGICAL HISTORY:  1. Cholecystectomy.  2. Thyroid surgery.  3. Hysterectomy.  4. Cesarean section.  5. Benign left leg tumor resected.   ALLERGIES/INTOLERANCES:  PENICILLIN, NAPROSYN, PHENERGAN, E-MYCIN.   MEDICATIONS:  1. Prilosec 20 mg daily.  2. Aspirin 325 mg daily.  3. Lopressor 25 mg b.i.d.   SOCIAL HISTORY:  The patient is married.  She has 4 children.  She is a  Conservation officer, nature in the school lunchrooms.  She quit smoking 20 years ago after  one pack per day for only 5 years.  She does not drink alcohol.   FAMILY HISTORY:  Noncontributory for early coronary artery disease.  There is a history of breast cancer in her mother and lung cancer in her  father.   REVIEW  OF SYSTEMS:  As stated in the HPI and positive for headaches,  dizziness, reflux, joint pains.  Negative for all other systems.   PHYSICAL EXAMINATION:  GENERAL:  The patient is pleasant and in no  distress.  VITAL SIGNS:  Blood pressure 172/76, heart rate 55 and regular.  HEENT:  Eyes unremarkable, pupils equal, round, and reactive to light,  fundi not visualized, oral mucosa unremarkable.  NECK:  No jugular venous distention at 45 degrees, carotid upstroke  brisk and symmetric, no bruits, no thyromegaly.  LYMPHATICS:  No cervical, axillary, or inguinal adenopathy.  LUNGS:  Clear to auscultation bilaterally.  BACK:  No costovertebral angle mass.  CHEST:  Unremarkable.  HEART:  PMI not displaced or sustained, S1 and S2 within normal limits,  no S3, no S4, no clicks, no rubs, no murmurs.  ABDOMEN:  Obese, positive bowel sounds normal in frequency and pitch, no  bruits, no rebound, no guarding, no midline pulsatile mass, no  hepatomegaly, no splenomegaly.  SKIN:  No rashes, no nodules.  EXTREMITIES:  2+ pulses throughout, no edema, no cyanosis, no clubbing.  NEURO:  Oriented to  person, place, and time, cranial nerves II through  XII grossly intact, motor grossly intact.   EKG sinus bradycardia, rate 55, axis within normal limits, intervals  within normal limits, no acute ST-wave changes.   ASSESSMENT/PLAN:  1. Chest:  The patient has had chest discomfort and abnormal EKG.  The      EKG was when she was under the stress of atrial fibrillation.      Because of this combination, she does need screening to rule out      obstructive coronary disease.  An exercise treadmill test would not      be helpful as she demonstrated the ST-segment changes that would      label this a positive study.  Rather, she needs the extra      sensitivity and specificity of stress perfusion imaging.  I will      arrange an exercise Cardiolite.  If this is normal then her dyspnea      and chest discomfort will be unlikely to have a cardiac etiology.      Her dyspnea may be related to deconditioning and weight.  2. Atrial fibrillation.  We talked about this.  The paroxysms are      infrequent.  Perhaps, she is on the higher dose of beta-blocker,      again she will not have these.  If they come with more frequency,      we need to consider further management.  At this point, she does      not have high risk for thromboembolic events, so I think she could      remain off Coumadin and stay on aspirin.  3. Obesity.  She understands the need to lose weight with diet and      exercise.  4. Hypertension.  The patient's blood pressure is elevated today, but      she says it is well controlled at home.  I do not see any other      readings that are elevated.  I have asked to get a blood pressure      diary.  Certainly, she needs TLC (therapeutic lifestyle changes) to      include weight loss to control her blood pressure.  5. Followup.  I will see her back in 3-4 months or sooner based on  symptoms of the results of the stress test.  Of note, I am going to      check some routine labs  today to include a TSH, BMET, CBC.  She has      not had blood work done in quite a while.     Rollene Rotunda, MD, [facility]  Electronically Signed    JH/MedQ  DD: 01/15/2008  DT: 01/15/2008  Job #: [id number]   cc:   Corwin Levins, MD

## 2010-08-21 NOTE — Assessment & Plan Note (Signed)
[facility]                            CARDIOLOGY OFFICE NOTE   PHILIS, DOKE                     MRN:          [id number]  DATE:06/15/2008                            DOB:          05-08-1945    PRIMARY CARE PHYSICIAN:  Corwin Levins, MD   REASON FOR PRESENTATION:  Evaluate the patient with atrial fibrillation.   HISTORY OF PRESENT ILLNESS:  The patient presents for followup of atrial  fibrillation.  She was briefly hospitalized on May 27, 2008, with  an episode of atrial fibrillation.  This was paroxysmal and self-  limited.  She was treated in the emergency room with diltiazem infusion,  and subsequently, converted to sinus rhythm.  She had no further  arrhythmias and was discharged home.  Since then, she has had a few  flip-flops.  However, she has had none of the sustained  tachyarrhythmias that is her usual atrial fibrillation.  She has had  about 1 recurrence a year by her report.  She has not had any presyncope  or syncope.  She has not had any chest discomfort, neck or arm  discomfort.  She has had no new shortness of breath, and denies any PND  or orthopnea.   PAST MEDICAL HISTORY:  1. Paroxysmal atrial fibrillation.  2. Borderline hypertension.  3. Borderline dyslipidemia.  4. History of hypothyroidism status post PTU therapy in the past.  5. Thyroid surgery.  6. Cholecystectomy.  7. Hysterectomy.  8. Benign left leg tumor resected.  9. Cesarean section.   ALLERGIES/INTOLERANCES:  PENICILLIN, NAPROSYN, PHENERGAN, and  ERYTHROMYCIN.   MEDICATIONS:  1. Aspirin 325 mg daily.  2. Lopressor 25 mg b.i.d.   REVIEW OF SYSTEMS:  As stated in the HPI, and otherwise negative for all  other systems.   PHYSICAL EXAMINATION:  GENERAL:  The patient is in no distress.  VITAL SIGNS:  Blood pressure 150/77, heart rate 64 and regular, weight  227 pounds.  HEENT:  Eyelids unremarkable, pupils equal, round, and react to light,  fundi not visualized, oral mucosa unremarkable.  NECK:  No jugular venous distention at 45 degrees, carotid upstroke  brisk and symmetric, no bruits, no thyromegaly.  LYMPHATICS:  No cervical, axillary, or inguinal adenopathy.  LUNGS:  Clear to auscultation bilaterally.  BACK:  No costovertebral angle tenderness.  CHEST:  Unremarkable.  HEART:  PMI not displaced or sustained, S1 and S2 within normal limits,  no S3, no S4, no clicks, no rubs, no murmurs.  ABDOMEN:  Obese, positive bowel sounds, normal in frequency and pitch,  no bruits, no rebound, no guarding, no midline pulsatile mass, no  hepatomegaly, no splenomegaly.  SKIN:  No rashes, no nodules.  EXTREMITIES:  Pulses 2+, no edema.   EKG; sinus rhythm, rate 64, axis within normal limits, intervals within  normal limits, no acute ST-T wave changes.   ASSESSMENT AND PLAN:  1. Atrial fibrillation.  The plan would be that if the patient has      another sustained episode, she should present to the emergency      room, at which point, I  would consider flecainide bolus dose pill-      in-pocket.  I think this would be safe.  She has had a normal      stress perfusion study in October 2009.  Echocardiograms have      demonstrated a well preserved ejection fraction with no significant      abnormalities.  She has a low CHADS score, and so at this point,      does not need Coumadin therapy.  She understands that if she goes      to the emergency room with AFib, she should have the [facility]      cardiologist called.  We did give her the flecainide bolus and      watch her for 5 hours.  2. Hypertension.  Blood pressure was not elevated during her      hospitalization.  If we see this in the future, I will increase her      beta-blocker.  She needs to lose weight to maintain a healthy blood      pressure as well.  3. Obesity, as above.  4. Dyslipidemia, per Dr. Jonny Ruiz.  5. Followup.  I will see her back in about 6 months or sooner if she       has any arrhythmias.     Rollene Rotunda, MD, [facility]  Electronically Signed    JH/MedQ  DD: 06/15/2008  DT: 06/16/2008  Job #: [id number]   cc:   Corwin Levins, MD

## 2010-08-21 NOTE — H&P (Signed)
[name], [name]              ACCOUNT NO.:  [id number]   MEDICAL RECORD NO.:  [id number]          PATIENT TYPE:  INP   LOCATION:  3707                         FACILITY:  [facility]   PHYSICIAN:  Jessica Jefferson, MD          DATE OF BIRTH:  1945-05-24   DATE OF ADMISSION:  05/27/2008  DATE OF DISCHARGE:                              HISTORY & PHYSICAL   CHIEF COMPLAINT:  Palpitations, shortness of breath, and chest pain.   HISTORY OF PRESENTING ILLNESS:  The patient is a 65 year old white  female with history of paroxysmal atrial fibrillation occurring  approximately 2-3 times per year with an episode of palpitations that  woke her up from sleep this evening about 2 or 3 a.m.  She says this is  associated with some chest pain and shortness of breath and for her  atrial fibrillation episodes, this is normal for her.  She has had  several evaluations regarding her chest pain including left heart  catheterization, it has not demonstrated any obstructive coronary  disease and a recent Myoview by Dr. Jenene Slicker office in August 2009,  was negative for ischemia.  Her chest discomfort after rate control was  approximately 2/10 in nature.  Fortunately, she has been relatively well  in regards to her atrial fibrillation control and she has been taking  her medication as scheduled.   PAST MEDICAL HISTORY:  1. Paroxysmal atrial fibrillation on metoprolol therapy only, not on      Coumadin due to CHADS score of 0.  2. Hypertension.  3. Obesity.  4. History of hyperthyroidism by personal history only.  5. Gastroesophageal reflux disease.   SOCIAL HISTORY:  She is a previous tobacco smoker, quit 20 years ago,  had a 5-pack-year history.  No alcohol.  No drug use.   FAMILY HISTORY:  Reviewed and noncontributory to the patient's current  medical condition.   MEDICATIONS:  1. Metoprolol 25 mg b.i.d.  2. Aspirin.  3. Prilosec.   ALLERGIES:  No known drug allergies.   REVIEW OF SYSTEMS:  Negative  11-point review of systems except for those  dictated in the above HPI.   PHYSICAL EXAMINATION:  VITAL SIGNS:  Blood pressure is 107/57, heart  rate is in the 100s.  She is afebrile.  GENERAL:  Well-developed, well-nourished white female in no acute  distress.  HEENT:  Moist mucous membranes.  No scleral icterus.  No conjunctival  pallor.  NECK:  Supple.  Full range of motion.  No jugular venous distention.  CARDIOVASCULAR:  Irregularly regular, but no rales, murmurs, or gallops.  CHEST:  Clear to auscultation bilaterally.  No wheezes, rales, or  rhonchi.  ABDOMEN:  Soft, nontender, and nondistended with normoactive bowel  sounds.  EXTREMITIES:  No peripheral edema and pulses are 2+ bilaterally.   EKG demonstrates atrial fibrillation with rapid ventricular response and  nonspecific ST and T wave abnormality, unchanged essentially from prior  ECGs.   LABORATORY DATA:  Significant for BUN and creatinine 8 and 0.5.  Hemoglobin 13.4, white count 9.6, and platelets of 264.   IMPRESSION:  1.  Atrial fibrillation with rapid ventricular response.  2. Chest pain secondary to the above.  3. History of hypertension.   PLAN:  We will admit the patient to telemetry, rate control with oral  metoprolol on a q.6-hour basis.  Heparinization additionally for  possibility of D/C cardioversion in the morning.  If she does not  spontaneously convert, and may consider initiation of antiarrhythmic  therapy since this is yet another hospitalization due to her atrial  fibrillation symptoms.  Will cycle biomarkers for acute coronary  syndrome, but I think this is unlikely to be acute plaque ruptures  etiology for chest pain but rather secondary to arrhythmia.      Jessica Jefferson, MD  Electronically Signed     JT/MEDQ  D:  05/27/2008  T:  05/27/2008  Job:  [id number]

## 2010-08-23 ENCOUNTER — Ambulatory Visit (INDEPENDENT_AMBULATORY_CARE_PROVIDER_SITE_OTHER): Payer: BC Managed Care – PPO | Admitting: Internal Medicine

## 2010-08-23 ENCOUNTER — Encounter: Payer: Self-pay | Admitting: Internal Medicine

## 2010-08-23 VITALS — BP 120/70 | HR 67 | Ht 60.0 in | Wt 235.4 lb

## 2010-08-23 DIAGNOSIS — J309 Allergic rhinitis, unspecified: Secondary | ICD-10-CM

## 2010-08-23 DIAGNOSIS — T6191XA Toxic effect of unspecified seafood, accidental (unintentional), initial encounter: Secondary | ICD-10-CM

## 2010-08-23 NOTE — Assessment & Plan Note (Signed)
No current problems, no routine need for meds.

## 2010-08-23 NOTE — Patient Instructions (Signed)
Please call as needed 

## 2010-08-23 NOTE — Progress Notes (Signed)
Subjective:    Patient ID: [name], female    DOB: 11-19-1945, 65 y.o.   MRN: [id number]  Asthma Her past medical history is significant for asthma.   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 Here today for allergy testing.  Allergy profile broadly positive including some fish/ shrimp.  Off anthiistamines she notes sneeze in early AM and at night- when she sits out on porch. Eyes itch, nose waters, postnasal drip. Wheezes a little at night, less than in past, but especially as she lies down.  Skin test- Pos grass, trees, dust, and fish, shell fish, and some incidental additional foods.  She got note and will stay out of work when fish served and she says this as helped a lot. Discussed allergy vaccine, xolair.  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 She continues to skip work on days when fish is being served. No bad days since last here. 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 Has not needed rescue inhaler in years.   Review of Systems  Constitutional:   No weight loss, night sweats,  Fevers, chills, fatigue, lassitude. HEENT:   No headaches,  Difficulty swallowing,  Tooth/dental problems,  Sore throat,                No sneezing, itching, ear ache, nasal congestion, post nasal drip,   CV:  No chest pain,  Orthopnea, PND, swelling in lower extremities, anasarca, dizziness, palpitations  GI  No heartburn, indigestion, abdominal pain, nausea, vomiting, diarrhea, change in bowel habits, loss of appetite  Resp: No shortness of breath with exertion or at rest.  No excess mucus, no productive cough,  No non-productive cough,  No coughing up of blood.  No change in color of mucus.  No wheezing.   Skin: no rash or lesions.  GU: no  dysuria, change in color of urine, no urgency or frequency.  No flank pain.  MS:  No joint pain or swelling.  No decreased range of motion.  No back pain.  Psych:  No change in mood or affect. No depression or anxiety.  No memory loss.      Objective:   Physical Exam  General- Alert, Oriented, Affect-appropriate, Distress- none acute  obese  Skin- rash-none, lesions- none, excoriation- none  Lymphadenopathy- none  Head- atraumatic  Eyes- Gross vision intact, PERRLA, conjunctivae clear secretions  Ears- Normal- Hearing, canals, Tm   Nose- clear mucus bridging, No- Septal dev, polyps, erosion, perforation   Throat- Mallampati II , mucosa clear , drainage- none, tonsils- atrophic  Neck- flexible , trachea midline, no stridor , thyroid nl, carotid no bruit  Chest - symmetrical excursion , unlabored     Heart/CV- RRR , no murmur , no gallop  , no rub, nl s1 s2                     - JVD- none , edema- none, stasis changes- none, varices- none     Lung- clear to P&A, wheeze- none, cough- none , dullness-none, rub- none     Chest wall-   Abd- tender-no, distended-no, bowel sounds-present, HSM- no  Br/ Gen/ Rectal- Not done, not indicated  Extrem- cyanosis- none, clubbing, none, atrophy- none, strength- nl  Neuro- grossly intact to observation     Assessment & Plan:

## 2010-08-24 NOTE — Consult Note (Signed)
NAME:  [name], [name]                        ACCOUNT NO.:  [id number]   MEDICAL RECORD NO.:  [id number]                   PATIENT TYPE:  EMS   LOCATION:  MAJO                                 FACILITY:  [facility]   PHYSICIAN:  Rosalyn Gess. Norins, M.D. [facility]         DATE OF BIRTH:  Mar 30, 1946   DATE OF CONSULTATION:  02/20/2002  DATE OF DISCHARGE:                                   CONSULTATION   CHIEF COMPLAINT:  Racing heart rate.   HISTORY OF PRESENT ILLNESS:  The patient is a 65 year old, married, white  female with no prior cardiac history.  She awoke at 0630 hours with a rapid  heart rate, sharp stabbing substernal chest discomfort with some radiation  of discomfort to the proximal left upper extremity.  The patient denies any  shortness of breath.  She had no diaphoresis.  She reports that the  discomfort lasted 30 minutes until she got to the emergency department.  The  patient has had no prior problem with rapid heart rate.  She does have a  chronic history of dyspepsia for which she takes a proton pump inhibitor.  She does drink 1.5 L of caffeinated soft drinks daily.   CARDIAC RISK PROFILE:  Negative family history.  Negative tobacco abuse.  Negative cholesterol.  Negative diabetes.  Positive for obesity, sedentary  lifestyle, and being surgically postmenopausal.   PAST SURGICAL HISTORY:  1. Cholecystectomy.  2. Hysterectomy.  3. Appendectomy.  4. Benign tumor excised from her left leg.  5. Cesarean section.   PAST MEDICAL HISTORY:  She has had the usual childhood diseases.  She is  otherwise in direct good health with no multiple medical problems.  She does  have chronic dyspepsia for which she has been taking a proton pump  inhibitor.  She is a gravida 4, para 4.  She recently had an abscessed tooth  and was seen at the [facility] Emergency Department.   MEDICATIONS:  Prilosec OTC.  She has just completed an antibiotic that  begins with C and I  suspect is it clindamycin.   HABITS:  Tobacco:  She quit 10 years ago.  Alcohol:  None.   DRUG ALLERGIES:  PENICILLIN with a rash.  ERYTHROMYCIN causes nausea.   FAMILY HISTORY:  Positive for breast cancer in her mother and a sister.  Negative for colon cancer.  Positive for diabetes in a brother.  Positive  for hypertension in the family.  Negative for hyperlipidemia.   SOCIAL HISTORY:  The patient has been married 36 years.  She has four sons  and eight grandchildren.  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   PHYSICAL EXAMINATION:  VITAL SIGNS:  The temperature was 97.1 degrees, blood  pressure 141/96, pulse 156, respirations 20, and O2 saturation was 98% on  room air.  GENERAL APPEARANCE:  This is an  obese Caucasian female in no acute distress.  HEENT:  Unremarkable.  NECK:  Supple with no thyromegaly.  NODES:  No adenopathy was noted in the cervical or supraclavicular regions.  CHEST:  No CVA tenderness.  LUNGS:  Clear to auscultation and percussion.  BREASTS:  Deferred.  CARDIOVASCULAR:  2+ radial pulses.  No JVD or carotid bruits.  She had a  quiet precordium.  She had a regular rate and rhythm to my exam with no  murmur, rub, or gallop.  ABDOMEN:  Obese and soft.  She has significant tenderness to deep palpation  in the epigastrium and no tenderness otherwise.  PELVIC:  Deferred.  RECTAL:  Deferred.  EXTREMITIES:  Without edema.  No further examination conducted.   LABORATORY DATA:  The initial 12-lead electrocardiogram revealed a sinus  tachycardia with sinus arrhythmia with first degree AV block by machine  read.  I concur with this with no signs of acute injury, though irregularity  to suggest to atrial fibrillation.  The second EKG performed 20 minutes  later after an IV bolus of Cardizem revealed a sinus rhythm with no sign of  acute injury.   The white count was 8800 with a normal differential, hemoglobin  13.4, and  hematocrit 39.7.  Chemistries revealed sodium 140, potassium 3.6, chloride  106, BUN 12, creatinine 0.7, and glucose 115.  The CK was 25.   ASSESSMENT AND PLAN:  1. Cardiovascular.  I suspect that the patient is presenting with an episode     of paroxysmal supraventricular tachyarrhythmia.  No evidence by history,     laboratory, or examination to suspect coronary artery syndrome or atrial     fibrillation.  The patient was mildly hypertensive at admission.  The     patient is stable to be discharged home.  I discussed this with her.  She     does understand that she has a low to moderate risk profile.  The patient     is to be started on Cardizem CD 120 mg daily.  She is to see Corwin Levins, M.D., her primary care physician for follow-up on February 22, 2002, or February 23, 2002, and I will make arrangements for this.  The     patient is to take aspirin 325 mg once a day.  2. Gastrointestinal.  Patient with chronic dyspepsia.  Will start her on     ranitidine 150 mg q.h.s. or b.i.d.  A prescription was provided.   The patient's condition at the time of this dictation and discharge is  stable and pain-free.                                               Rosalyn Gess Norins, M.D. [facility]    MEN/MEDQ  D:  02/20/2002  T:  02/20/2002  Job:  [id number]   cc:   Corwin Levins, M.D. [facility]

## 2010-08-24 NOTE — Cardiovascular Report (Signed)
[name], Jessica              ACCOUNT NO.:  [id number]   MEDICAL RECORD NO.:  [id number]          PATIENT TYPE:  INP   LOCATION:  2018                         FACILITY:  [facility]   PHYSICIAN:  Charlies Constable, M.D. [facility] DATE OF BIRTH:  Jul 09, 1945   DATE OF PROCEDURE:  11/01/2003  DATE OF DISCHARGE:  11/02/2003                              CARDIAC CATHETERIZATION   PROCEDURE:  Cardiac catheterization.   CLINICAL HISTORY:  [name] is 65 years old and was admitted to the  hospital with chest pain and a borderline-elevated troponin, and was  scheduled for evaluation angiography.   PROCEDURE:  The procedure was performed by the right femoral artery using  arterial sheath and 6-French preformed coronary catheters.  A femoral  arterial puncture was performed and Omnipaque contrast was used.  The  patient tolerated the procedure well and left the laboratory in satisfactory  condition.  Distal aortogram was performed to rule out renovascular cause  for hypertension.   RESULTS:  Left main coronary artery:  The left main coronary artery is free  of significant disease.   Left anterior descending artery:  The left anterior descending artery gave  rise to a diagonal branch and 2 septal perforators.  These and the LAD  proper were free of significant disease.   Circumflex artery:  The circumflex artery gave rise to a marginal branch and  a posterolateral branch.  These vessels were free of significant disease.   Right coronary artery:  The right coronary artery was a moderate-sized  vessel that gave rise to a posterior descending and 4 posterolateral  branches.  These vessels were free of significant disease.   LEFT VENTRICULOGRAM:  Left ventriculogram performed in the RAO projection  showed good wall motion with no areas of hypokinesis.   DISTAL AORTOGRAM:  A distal aortogram was performed which showed patent  renal arteries with no aortic or iliac obstruction.  The aortic pressure  was  134/69 with a mean of 95.  The left ventricular pressure was 134/25.   CONCLUSION:  Normal coronary angiography and left ventricular wall motion.   RECOMMENDATIONS:  Reassurance.       BB/MEDQ  D:  02/03/2004  T:  02/04/2004  Job:  [id number]   cc:   [place] Bing, M.D.   Corwin Levins, M.D. [facility]

## 2010-08-24 NOTE — Consult Note (Signed)
[name], [name]                          ACCOUNT NO.:  [id number]   MEDICAL RECORD NO.:  [id number]                   PATIENT TYPE:  INP   LOCATION:  2018                                 FACILITY:  [facility]   PHYSICIAN:  [name], M.D.               DATE OF BIRTH:  [birth year]   DATE OF CONSULTATION:  10/30/2003  DATE OF DISCHARGE:                                   CONSULTATION   Referring physician, [name], M.D.  Primary physician, [name],  M.D.   HISTORY OF PRESENT ILLNESS:  A 65 year old woman without known coronary  disease, presents with chest discomfort and slightly elevated troponin.  Ms.  [name] has no significant past cardiac history.  She was once admitted to  hospital with palpitations but no documented tachyarrhythmia.  She has not  previously been evaluated by a cardiologist nor undergone any significant  cardiac testing.  She did have a routine physical approximately one year ago  with negative laboratory studies at that time, although she has been told of  mild hyperlipidemia that has not required treatment.  She also has mild  hypertension that has been controlled with single-drug therapy.  She has a  past history of lower substernal discomfort that she has attributed to GERD.  Yesterday, just after awakening, she noted the onset of substernal chest  aching radiating to her jaw.  She had paresthesias of her leg and feet as  well.  This was different, primarily in intensity and duration, from past  discomfort.  Her symptoms lasted several hours, not resolving until she had  spent approximately one hour in the emergency department.  Her symptoms  faded gradually after application of a nitroglycerin patch.  She had a mild  recurrence of chest pressure this morning.  There was associated nausea  initially but no dyspnea nor diaphoresis.  She noted no palpitations.   Medications on admission included an antihypertensive, probably diltiazem.  She  also takes Prilosec and aspirin.   She reports an allergy to PENICILLIN and ERYTHROMYCIN.   PAST MEDICAL HISTORY:  Notable for cholecystectomy, appendectomy, and  hysterectomy with unilateral salpingo-oophorectomy.  She has a history of  GERD that is fairly well-controlled with a PPI.  She has not ever been told  of diabetes or sugar problems.  She has generally been overweight.  There is  a remote history of modest tobacco use.   SOCIAL HISTORY:  Lives in [place] with her husband.  Has four sons.  Denies excessive alcohol use or illicit drugs.   FAMILY HISTORY:  No coronary disease.   REVIEW OF SYSTEMS:  Developed night sweats at the time of her hysterectomy,  which have now largely resolved.  She has occasional headaches.  She  develops an occasional skin rash.  She has some left ankle edema  intermittently.  All other systems reviewed and are negative.   PHYSICAL EXAMINATION:  GENERAL:  A pleasant, older white woman in no acute  distress.  VITAL SIGNS:  The temperature is 97.3, heart rate 65 and regular,  respirations 20, blood pressure 105/65.  HEENT:  Anicteric sclerae, normal lids and conjunctivae.  NECK:  No jugular venous distention.  No carotid bruits.  ENDOCRINE:  No thyromegaly.  HEMATOPOIETIC:  No cervical, axillary, nor inguinal adenopathy.  LUNGS:  Clear.  CARDIAC:  Normal first and second heart sounds.  Minimal systolic murmur at  the left upper sternal border.  ABDOMEN:  Soft and nontender.  Normal bowel sounds.  No bruits.  No masses.  No organomegaly.  EXTREMITIES:  No edema.  Distal pulses intact.  NEUROMUSCULAR:  Symmetric strength and tone.  MUSCULOSKELETAL:  No joint deformities.   Laboratory notable for a third troponin of 0.08, initial troponins were 0.01  and 0.02.  CPK and CPK-MB have been entirely normal.  Sedimentation rate is  normal.  Chemistry profile and renal function are normal.  TSH is 0.009.   IMPRESSION:  [name] presents with low  to at worst moderate  cardiovascular risk, symptoms that are quite worrisome for myocardial  ischemia, and a minimal elevation in troponin, whose value was initially  normal.  She does have a benign examination and negative EKG during chest  discomfort and normal total CPK and CPK-MB.  In this situation, coronary  angiography would be useful to establish a definitive diagnosis.  Unfortunately, potential thyroid disease complicates her issues.  It is  possible that hyperthyroidism could cause troponin release from cardiac  muscle; however, she has no apparent myopathy, no symptoms of  hyperthyroidism, and had initially normal troponin.  Consequently, the  connection between potential thyroid problems and her presentation is  uncertain.  I would like to clarify her thyroid status before intravenous  contrast is administered.  Accordingly, a T3 and T4 level will be obtained  as well as a repeat TSH.  Abnormalities will be discussed with an  endocrinologist.  I anticipate cardiac catheterization on Tuesday.  We  greatly appreciate the request for consultation and will be happy to follow  this nice woman with you.  Current medical therapy with intravenous heparin  and nitroglycerin is appropriate.                                               [name], M.D.    RR/MEDQ  D:  10/30/2003  T:  10/31/2003  Job:  [id number]   cc:   [name], M.D. [facility]   [name], M.D. [facility]

## 2010-08-24 NOTE — H&P (Signed)
[name], [name]                          ACCOUNT NO.:  [id number]   MEDICAL RECORD NO.:  [id number]                   PATIENT TYPE:  INP   LOCATION:  2018                                 FACILITY:  [facility]   PHYSICIAN:  Lonzo Cloud. Kriste Basque, M.D. [facility]            DATE OF BIRTH:  [birth year]   DATE OF ADMISSION:  10/29/2003  DATE OF DISCHARGE:                                HISTORY & PHYSICAL   HISTORY:  The patient is a 65 year old white female patient of Dr. Jonny Ruiz who  was last seen in December 2004.  She presented to the emergency room this  afternoon with chest pain starting this morning.  She describes the pain as  a dull substernal chest pain, moderate in severity, rated at 5 to 6/10.  The  pain lasted about 2 hours, and she came to the emergency room and was seen  by Dr. Ethelda Chick of the ER staff.  The pain resolved after several more  hours spontaneously.  She noted some radiation to the left side of her neck  but not to her arm.  The pain was associated with some mild nausea and a  headache but no vomiting.  There was no diaphoresis.  She was mildly short  of breath.  She states she has had similar chest pain but not as severe over  the last one and one-half years.  They occur on the average once or twice  per month and again described as a dull sensation, occasionally with some  racing type palpitations.  They have occurred at night and have awakened her  from sleep.  There are no known precipitating events.  She is rather  sedentary but has not noticed chest discomfort with walking or doing stairs.  She notes the average pain would last one to two hours and resolve  spontaneously.  There has been no help from anything she could do including  Mylanta or Maalox.  She was seen in the emergency room in 2003 with  palpitations and was given Cardizem at that time.  She does not know the  extent of any other workup as an outpatient.   Cardiac risk factors include a history of  hypertension; she is an ex-smoker,  and she thinks her cholesterol may have been mildly elevated, although she  is only on diet therapy.  There is a negative family history and negative  history for diabetes.   Due to the severity of the pain and the recent onset, Dr. Ethelda Chick felt  she needed to be admitted to rule out myocardial infarction and further  cardiac testing.   PAST MEDICAL HISTORY:  1. HYPERTENSION:  She has a history of mild hypertension controlled on     Cardizem as noted.  2. GASTROESOPHAGEAL REFLUX DISEASE:  She has a history of gastroesophageal     reflux disease for which she takes Prilosec.  She does not recall the  extent of previous workup.  3. Status post laparoscopic cholecystectomy in 1996.  4. Status post removal of a lipoma from her left leg in 1993.  5. Status post hysterectomy in 1984.  6. Venous insufficiency changes with mild edema in the past.   MEDICATIONS:  1. Cartia XT 120 mg p.o. daily.  2. Enteric-coated aspirin 325 mg p.o. daily.  3. Prilosec 20 mg p.o. daily.   ALLERGIES:  No known drug allergies.   FAMILY HISTORY:  The patient's father died at age 52 from lung cancer.  He  had no known heart disease.  The patient's mother is alive at age 53 with a  history of breast cancer and having a previous mastectomy.  She does not  have any heart disease either.  The patient had five siblings.  One brother  died with cirrhosis of the liver due to alcoholism.  One brother has  diabetes, and the other is alive and well.  One sister had a mastectomy for  breast cancer; the other is alive and well.   SOCIAL HISTORY:  The patient is married to her husband, [name], for the  last 38 years.  The couple have four children, all of whom are in good  health.  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  She is an ex-smoker, having started at age 60 and  quit at age 30.  She smoked up to a pack a day.   She quit in 1984.  No  alcohol.   REVIEW OF SYSTEMS:  She notes occasional frontal headaches.  Denies visual  symptoms.  She denies sore throat, etc.  She had dental problems in the  past.  She denies any swelling in her neck.  She denies cough or sputum  production.  She denies recent abdominal pain, constipation, diarrhea, or  blood in her stool.  She denies any GU problems.  She has had previous  hysterotomy.  She denies musculoskeletal complaints except for occasional  pain in the her knees.  She notes occasional swelling in her lower  extremities related to venous disease.  She has dry skin.  No other skin  lesions other than lipoma being removed.   PHYSICAL EXAMINATION:  GENERAL:  Healthy-appearing 65 year old white female  in no acute distress.  VITAL SIGNS:  Blood pressure 136/62, pulse 90 per minute and regular,  respirations 20 minute and not labored, O2 saturation 98% on room air,  temperature 98 degrees.  HEENT:  Unremarkable.  I could not see the fundi well.  Teeth are in fair  condition.  NECK:  No jugular venous distention, no carotid bruits, no thyromegaly or  lymphadenopathy.  CHEST:  Clear to percussion and auscultation.  CARDIOVASCULAR:  Regular rhythm, normal S1 and S2 without murmurs, rubs, or  gallops detected.  Peripheral pulses are intact without bruits.  GASTROINTESTINAL:  Exam reveals somewhat obese but soft abdomen.  Normal  bowel sounds.  No evidence of organomegaly or masses.  Scars from previous  laparoscopic cholecystectomy.  MUSCULOSKELETAL:  Some crepitation in the knees.  She has a scar around the  left knee from previous lipoma surgery.  She has venous insufficiency with  some varicose veins and trace edema.  NEUROLOGIC:  Intact without focal abnormalities detected.  SKIN:  Dry skin and scar from previous lipoma surgery.   IMPRESSION:  A 65 year old white female with a year and one-half history of intermittent chest pain.  She  has her worst ever  pain this morning that  lasted up to four hours and then resolved here in the emergency room  spontaneously.  The pain has some typical and atypical features for angina.  She has risk factors including hypertension, question of hyperlipidemia, and  ex-smoker.  Admission is warranted to rule out acute coronary syndrome with  serial EKG and enzymes and consideration of Cardiolite versus  catheterization.                                                Lonzo Cloud. Kriste Basque, M.D. [facility]    SMN/MEDQ  D:  10/29/2003  T:  10/30/2003  Job:  [id number]   cc:   Corwin Levins, M.D. [facility]

## 2010-08-24 NOTE — Discharge Summary (Signed)
NAMECASSANDRA, HARBOLD                          ACCOUNT NO.:  [id number]   MEDICAL RECORD NO.:  [id number]                   PATIENT TYPE:  INP   LOCATION:  2018                                 FACILITY:  [facility]   PHYSICIAN:  Rene Paci, M.D. [facility]          DATE OF BIRTH:  01/03/1946   DATE OF ADMISSION:  10/29/2003  DATE OF DISCHARGE:  11/02/2003                                 DISCHARGE SUMMARY   DISCHARGE DIAGNOSES:  1. Atypical chest pain.  2. Abnormal electrocardiogram.  3. Normal coronaries.  4. Hypothyroidism.   HISTORY OF PRESENT ILLNESS:  Ms. Gabrielsen is a 65 year old, white female who  presented to the emergency department with chest pain.  She described the  pain as a dull, substernal chest pain which she rated at 5-6/10.  The pain  lasted about 2 hours.  The pain resolved spontaneously.  There were no  precipitating factors.  She did have some mild radiation to the left side of  her neck, but not to her arm.  The patient's pain was mildly associated with  nausea and headache, but no vomiting.  There was no diaphoresis.  She also  had some mild shortness of breath.  She states she had a similar pain 1-1/2  years prior to admission.   PAST MEDICAL HISTORY:  1. Hypertension.  2. Gastroesophageal reflux disease.  3. Status post laparoscopic cholecystectomy in 1996.  4. Status post removal of a lipoma from her left leg in 1993.  5. Status post hysterectomy in 1984.  6. Venous insufficiency.   HOSPITAL COURSE:  Problem 1.  CARDIOVASCULAR:  The patient presented with  chest pain.  Serial cardiac enzymes were negative, however, she had an  abnormal EKG.  The patient was seen in consultation by cardiology.  Because  of the patient's abnormal EKG, they recommended heparin and a cardiac  catheterization.  The patient's cardiac catheterization was performed on  November 01, 2003, revealing normal coronaries with an EF of 60%.  It is felt  that the patient's chest pain was not  related to coronary disease.  The  patient did have some evidence of SVT during this admission and it may have  been that her chest pain was precipitated by heart palpitations.  The  patient was started on Lopressor during this admission for rate control.   Problem 2.  ENDOCRINE:  The patient's TSH was found to be low at 0.009.  A  free T4 was elevated.  It was felt that the patient needed PTU.  Prior to  PTU initiation, she had a 24-hour radioactive iodine and then an uptake  scan.  The uptake scan revealed a markedly diminished radioactive iodine  uptake and no visualization of the thyroid.  As noted, the patient has been  started on PTU which she is tolerating.  We will arrange a one time followup  with Dr. Everardo All in about a month to follow up on  her TSH and current  regimen.   DISCHARGE LABORATORY DATA AND X-RAY FINDINGS:  Cholesterol was 133,  triglycerides 83, HDL 53, LDL 63.  CBC was normal.  BMET was normal.  Sedimentation rate was 24.  Coagulations were normal.  LFTs were normal.   DISCHARGE MEDICATIONS:  1. PTU 50 mg t.i.d.  2. Cartia XT 120 mg to be held until further instructed by Dr. Jonny Ruiz.  3. Aspirin 325 mg q.d.  4. Prilosec 20 mg q.d.  5. Lopressor 25 mg 1/2 tablet b.i.d.   FOLLOW UP:  Follow up with Dr. Everardo All on Wednesday, August 24, at 10 a.m.  Follow up with Dr. Jonny Ruiz in 1-2 weeks as needed.      Cornell Barman, P.A. [facility]                  Rene Paci, M.D. [facility]    LC/MEDQ  D:  11/02/2003  T:  11/02/2003  Job:  [id number]   cc:   Gregary Signs A. Everardo All, M.D. [facility]   [place] Bing, M.D.

## 2010-08-24 NOTE — Assessment & Plan Note (Signed)
[facility]                           GASTROENTEROLOGY OFFICE NOTE   NAME:[name], [name]                     MRN:          [id number]  DATE:12/17/2005                            DOB:          12/03/1945    PROBLEM:  Abdominal pain.   [name] is a 65 year old white female referred through the courtesy of  Dr. Jonny Ruiz for evaluation.  Approximately 3 weeks ago, she was complaining of  severe upper epigastric pain.  Exam apparently demonstrated tenderness in  the left lower quadrant.  She was placed on Flagyl.  CT looking for  diverticulitis demonstrated diverticulosis.  When she presented, she had a  fever with nausea and vomiting, as well.  She now feels well, except for  very rare heartburn and dysphagia.   PAST MEDICAL HISTORY:  1. Pertinent for hypertension.  2. Arrhythmias.  3. She is status post laparoscopic cholecystectomy complicated by a cystic      duct leak.  She has undergone sphincterotomy and stent replacement in      the past with resolution of her leak.  4. She is status post hysterectomy.   FAMILY HISTORY:  Noncontributory.   MEDICATIONS:  1. Prilosec.  2. Baby aspirin.  3. Lopressor.   ALLERGIES:  1. PENICILLIN.  2. NAPROSYN.  3. PHENERGAN.  4. ERYTHROMYCIN.   SOCIAL HISTORY:  She neither smokes nor drinks.  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   REVIEW OF SYSTEMS:  Reviewed and is positive for feet swelling.   PHYSICAL EXAMINATION:  GENERAL:  She is an obese female.  Pulse 64, blood  pressure 134/70, weight 220.  HEENT:  EOMI. PERRLA. Sclerae are anicteric.  Conjunctivae are pink.  NECK:  Supple without thyromegaly, adenopathy or carotid bruits.  CHEST:  Clear to auscultation and percussion without adventitious sounds.  CARDIAC:  Regular rhythm; normal S1 S2.  There are no murmurs, gallops or  rubs.  ABDOMEN:  There is mild left lower quadrant tenderness without guarding or  rebound.  There are no abdominal  masses or organomegaly.  EXTREMITIES:  Full range of motion.  No cyanosis, clubbing or edema.  RECTAL:  Deferred.   IMPRESSION:  A 65 year old female with severe upper abdominal pain with left  lower quadrant tenderness, fever and vomiting, resolved over 2-3 days on  antibiotic therapy.  There is no CT evidence for acute diverticulitis,  though questionable low-grade diverticulitis was raised.   RECOMMENDATION:  Colonoscopy.                                   Barbette Hair. Jessica Dice, MD,FACG   RDK/MedQ  DD:  12/17/2005  DT:  12/18/2005  Job #:  [id number]

## 2010-08-24 NOTE — H&P (Signed)
NAMEKARRINE, KLUTTZ                          ACCOUNT NO.:  [id number]   MEDICAL RECORD NO.:  [id number]                   PATIENT TYPE:  INP   LOCATION:  2018                                 FACILITY:  [facility]   PHYSICIAN:  Charlies Constable, M.D. [facility]              DATE OF BIRTH:  03-02-1946   DATE OF ADMISSION:  10/29/2003  DATE OF DISCHARGE:                                HISTORY & PHYSICAL   PAST MEDICAL HISTORY:  Ms. Alvy Bimler is 65 years old and was admitted 2 days  ago with chest pain.  She was found to have lab tests consistent with  hyperthyroidism, but also her troponins were slightly positive.  She was  evaluated with a thyroid scan and then scheduled for catheterization today.   CARDIOLOGIST:  Charlies Constable, M.D.   PROCEDURE:  The procedure was performed via the right femoral artery  utilizing an arterial sheath and 6 French coronary catheters.  We had to use  multiple sticks in order to enter the artery.  We finally obtained access.  A 6 French performed coronary catheters were used.  This procedure was  performed to rule out renal vascular causes for hypertension.  The right  femoral artery was closed with AngioSeal at the end of the procedure.  The  patient tolerated the procedure well, and left the laboratory in  satisfactory condition.   RESULTS:  The aortic pressure was 134/69 with a mean of 95 and the left  ventricular pressure was 134/25.   LEFT MAIN CORONARY ARTERY:  Free of significant disease.   LEFT ANTERIOR DESCENDING ARTERY:  Gave rise to a diagonal branch and two  septal perforators. These were __________ free of significant disease.   CIRCUMFLEX ARTERY:  The circumflex artery gave rise to a marginal branch and  an AV branch.  These vessels were free of significant disease.   RIGHT CORONARY ARTERY:  The right coronary artery was a moderately large  vessel giving rise to a posterior descending branch, and 4 posterolateral  branches.  These vessels were free of  significant disease.   LEFT VENTRICULOGRAM:  The left ventriculogram performed in the RAO  projection showed good wall motion with no areas of hypokinesis.  The  estimated ejection fraction was 60%.   DISTAL AORTOGRAM:  Distal aortogram was performed showing patent renal  arteries and no significant aortoiliac obstruction.   CONCLUSIONS:  Normal coronary angiography and left ventricular function.   RECOMMENDATIONS:  1. Reassurance.  2. 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     ___________________________________________                                         Charlies Constable, M.D. [facility]   BB/MEDQ  D:  11/01/2003  T:  11/01/2003  Job:  [id number]   cc:   Corwin Levins, M.D. [facility]   Hodge Bing, M.D.   Charlies Constable, M.D. [facility]   Cardiac Cath Lab

## 2010-08-26 NOTE — Assessment & Plan Note (Signed)
She has managed to avoid exposure by skipping work on days when fish is served. She finds this works for her.

## 2010-09-10 ENCOUNTER — Other Ambulatory Visit: Payer: Self-pay | Admitting: Cardiology

## 2010-11-12 ENCOUNTER — Telehealth: Payer: Self-pay | Admitting: Internal Medicine

## 2010-11-12 NOTE — Telephone Encounter (Signed)
Spoke with the pt and she states she received a letter from her insurance company stating that Dr. Maple Hudson treated her for an injury and she states this has not happened. I advised the pt to contact her insurance company first and have them explain the letter to her and if there are still question she can call billing. Pt states understanding. Carron Curie, CMA

## 2010-11-27 ENCOUNTER — Telehealth: Payer: Self-pay | Admitting: Internal Medicine

## 2010-11-27 NOTE — Telephone Encounter (Signed)
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 She needs the letter to state she can not be around fish being prepared at all due to her allergy to fish. Pt would like to pick this letter up on Friday 11/30/10, pls advise if you are willing to give letter and we can call pt and let her know.

## 2010-11-27 NOTE — Telephone Encounter (Signed)
Per Dr. Maple Hudson, will the letter from June 20, 2010 be ok if we re-date it for the present? Carron Curie, CMA

## 2010-11-27 NOTE — Telephone Encounter (Signed)
Spoke with [name]. She states that the letter will not be okay because it needs to state very specific precautions that she needs to take. She states letter is too generic. She states that Dr Maple Hudson had told her before that it may be good idea to not work at all on the days they serve fish. Please advise thanks

## 2010-11-28 NOTE — Telephone Encounter (Signed)
Dictated 11/28/10

## 2010-11-28 NOTE — Letter (Signed)
November 28, 2010   Reference:  Food allergy.  RE:  MARGERIE, FRAISER MRN:  [id number]  /  DOB:  Feb 09, 1946  To whom it may concern:  Janmarie Smoot is under my care for allergy management.  She has a history of itching, hives, and nasal congestion with sneezing associated in particular with fish and seafood.  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  This history is supported by positive skin tests and blood tests for allergy antibodies against fish and seafood. Symptoms may be blunted with antihistamines, but best management is that she completely avoid this exposure.  She has been successful by not working in [employer] on days when these foods are being served. Avoidance is the recommended therapy.  I expect this condition to be permanent.   Sincerely,     Clinton D. Maple Hudson, MD, Tonny Bollman, FACP Electronically Signed   CDY/MedQ  DD: 11/28/2010  DT: 11/28/2010  Job #: [id number]

## 2010-11-29 NOTE — Telephone Encounter (Signed)
Ready

## 2010-11-29 NOTE — Telephone Encounter (Signed)
Spoke with Florentina Addison regarding this- Please sign off in Docusign so pt can obtain letter, thanks!

## 2010-11-30 NOTE — Telephone Encounter (Signed)
Pt returned call. i advised her that she may pick up the letter. Nothing further needed per pt. [name]

## 2010-11-30 NOTE — Telephone Encounter (Signed)
LMOVMTCB-need to let patient know that her requested letter has been placed up front for patient to pick up per phone note.

## 2011-01-09 LAB — POCT I-STAT, CHEM 8
BUN: 9
Calcium, Ion: 1.12
Chloride: 105
Creatinine, Ser: 0.8
Glucose, Bld: 107 — ABNORMAL HIGH
HCT: 41
Hemoglobin: 13.9
Potassium: 3.9
Sodium: 140
TCO2: 29

## 2011-01-09 LAB — POCT CARDIAC MARKERS
CKMB, poc: <1 — ABNORMAL LOW
Myoglobin, poc: 43.5
Troponin i, poc: <0.05

## 2011-01-24 ENCOUNTER — Encounter: Payer: Self-pay | Admitting: Internal Medicine

## 2011-01-24 ENCOUNTER — Ambulatory Visit (INDEPENDENT_AMBULATORY_CARE_PROVIDER_SITE_OTHER): Payer: BC Managed Care – PPO | Admitting: Internal Medicine

## 2011-01-24 VITALS — BP 122/76 | HR 73 | Ht 60.0 in | Wt 234.6 lb

## 2011-01-24 DIAGNOSIS — J309 Allergic rhinitis, unspecified: Secondary | ICD-10-CM

## 2011-01-24 DIAGNOSIS — T6191XA Toxic effect of unspecified seafood, accidental (unintentional), initial encounter: Secondary | ICD-10-CM

## 2011-01-24 NOTE — Progress Notes (Signed)
01/24/11-    Patient ID: [name], female    DOB: [birth year], 65 y.o.   MRN: [id number]  Asthma Her past medical history is significant for asthma.   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 Here today for allergy testing.  Allergy profile broadly positive including some fish/ shrimp.  Off anthiistamines she notes sneeze in early AM and at night- when she sits out on porch. Eyes itch, nose waters, postnasal drip. Wheezes a little at night, less than in past, but especially as she lies down.  Skin test- Pos grass, trees, dust, and fish, shell fish, and some incidental additional foods.  She got note and will stay out of work when fish served and she says this as helped a lot. Discussed allergy vaccine, xolair.  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 She continues to skip work on days when fish is being served. No bad days since last here. 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 Has not needed rescue inhaler in years.   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 As long as she avoids fish she does well. Even the smell of fish cooking causes skin and eyes to itch and throat to close up. So far there has been no urticaria or wheezing. Her atrophic relation comes and goes. If it persists more than 2 hours she goes to the emergency room. This would be an issue if she needed to carry an EpiPen.   Review of Systems Constitutional:   No-   weight loss, night sweats, fevers, chills, fatigue, lassitude. HEENT:   No-  headaches, difficulty swallowing, tooth/dental problems, sore  throat,       No- recent sneezing, itching, ear ache, nasal congestion, post nasal drip,  CV:  No-   chest pain, orthopnea, PND, swelling in lower extremities, anasarca, dizziness,    + intermittent palpitations Resp: No-   shortness of breath with exertion or at rest.              No-   productive cough,  No non-productive cough,  No- coughing up of blood.              No-   change in color of mucus.  No- wheezing.   Skin: No-   rash or lesions. GI:  No-   heartburn, indigestion, abdominal pain, nausea, vomiting, diarrhea,                 change in bowel habits, loss of appetite GU: No-   dysuria, change in color of urine, no urgency or frequency.  No- flank pain. MS:  No-   joint pain or swelling.  No- decreased range of motion.  No- back pain. Neuro-     nothing unusual Psych:  No- change in mood or affect. No depression or anxiety.  No memory loss.   Objective:   Physical Exam General- Alert, Oriented, Affect-appropriate, Distress- none acute; obese Skin- rash-none, lesions- none, excoriation- none Lymphadenopathy- none Head- atraumatic            Eyes- Gross vision intact, PERRLA, conjunctivae clear  secretions            Ears- Hearing, canals-normal            Nose- Clear, no-Septal dev, mucus, polyps, erosion, perforation             Throat- Mallampati II , mucosa clear , drainage- none, tonsils- atrophic Neck- flexible , trachea midline, no stridor , thyroid nl, carotid no bruit Chest - symmetrical excursion , unlabored           Heart/CV- RRR , no murmur , no gallop  , no rub, nl s1 s2                           - JVD- none , edema- none, stasis changes- none, varices- none           Lung- clear to P&A, wheeze- none, cough- none , dullness-none, rub- none           Chest wall-  Abd- tender-no, distended-no, bowel sounds-present, HSM- no Br/ Gen/ Rectal- Not done, not indicated Extrem- cyanosis- none, clubbing, none, atrophy- none, strength- nl Neuro- grossly intact to  observation

## 2011-01-24 NOTE — Patient Instructions (Signed)
Please call as needed  Sample Patanase nasal antihistamine spray     Try 1-2 puffs each nostril up to twice daily as needed for very fast acting antihistamine.

## 2011-01-26 NOTE — Assessment & Plan Note (Signed)
She has been able to avoid exposure by choosing to take today off from work when fish is being served.. We have discussed precautionary antihistamine and considered the interaction of an EpiPen with her paroxysmal atrial fibrillation.

## 2011-01-26 NOTE — Assessment & Plan Note (Signed)
She is not noticing significant seasonal rhinitis

## 2011-03-26 ENCOUNTER — Other Ambulatory Visit: Payer: Self-pay | Admitting: Obstetrics and Gynecology

## 2011-03-26 DIAGNOSIS — Z1231 Encounter for screening mammogram for malignant neoplasm of breast: Secondary | ICD-10-CM

## 2011-04-11 ENCOUNTER — Other Ambulatory Visit: Payer: Self-pay | Admitting: Cardiology

## 2011-04-17 ENCOUNTER — Ambulatory Visit
Admission: RE | Admit: 2011-04-17 | Discharge: 2011-04-17 | Disposition: A | Discharge status: Home / Self Care | Payer: BC Managed Care – PPO | Source: Ambulatory Visit | Attending: Obstetrics and Gynecology | Admitting: Obstetrics and Gynecology

## 2011-04-17 DIAGNOSIS — Z1231 Encounter for screening mammogram for malignant neoplasm of breast: Secondary | ICD-10-CM

## 2011-07-25 ENCOUNTER — Ambulatory Visit (INDEPENDENT_AMBULATORY_CARE_PROVIDER_SITE_OTHER): Payer: BC Managed Care – PPO | Admitting: Internal Medicine

## 2011-07-25 ENCOUNTER — Encounter: Payer: Self-pay | Admitting: Internal Medicine

## 2011-07-25 VITALS — BP 112/60 | HR 65 | Ht 60.0 in | Wt 230.4 lb

## 2011-07-25 DIAGNOSIS — J302 Other seasonal allergic rhinitis: Secondary | ICD-10-CM

## 2011-07-25 DIAGNOSIS — J309 Allergic rhinitis, unspecified: Secondary | ICD-10-CM

## 2011-07-25 DIAGNOSIS — J45909 Unspecified asthma, uncomplicated: Secondary | ICD-10-CM

## 2011-07-25 MED ORDER — LEVALBUTEROL TARTRATE 45 MCG/ACT IN AERO: 1.0000 | INHALATION_SPRAY | RESPIRATORY_TRACT | Status: DC | PRN

## 2011-07-25 NOTE — Progress Notes (Signed)
Patient ID: [name], female    DOB: 03-Nov-1945, 66 y.o.   MRN: [id number]  Asthma Her past medical history is significant for asthma.   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 Here today for allergy testing.  Allergy profile broadly positive including some fish/ shrimp.  Off anthiistamines she notes sneeze in early AM and at night- when she sits out on porch. Eyes itch, nose waters, postnasal drip. Wheezes a little at night, less than in past, but especially as she lies down.  Skin test- Pos grass, trees, dust, and fish, shell fish, and some incidental additional foods.  She got note and will stay out of work when fish served and she says this as helped a lot. Discussed allergy vaccine, xolair.  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 She continues to skip work on days when fish is being served. No bad days since last here. 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 Has not needed rescue inhaler in years.   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 As long as she avoids fish she does well. Even the smell of fish cooking causes skin and eyes to itch and throat to close up. So far there has been no urticaria or wheezing. Her atrophic relation comes and goes. If it persists more than 2 hours she goes to the emergency room. This would be an issue if she needed to carry an EpiPen.  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 She has no inhaler.  Fish was taken off cafeteria line so she is doing better at work.   Review of Systems-see HPI Constitutional:   No-   weight loss, night sweats, fevers, chills, fatigue, lassitude. HEENT:   No-  headaches, difficulty swallowing, tooth/dental problems, sore throat,       No- recent sneezing ear ache, nasal congestion, post nasal drip, + itching CV:  No-   chest pain, orthopnea, PND, swelling in lower extremities, anasarca, dizziness,    + intermittent palpitations Resp: No-   shortness of breath with exertion or at rest.              No-   productive cough,  No non-productive cough,  No- coughing up of blood.              No-   change in color of mucus.  + wheezing.   Skin: No-   rash or lesions. GI:  No-   heartburn, indigestion, abdominal pain, nausea, vomiting,  GU:  MS:  No-   joint pain or swelling.   Neuro-     nothing unusual Psych:  No- change in mood or affect. No depression or anxiety.  No memory loss.   Objective:   Physical Exam General- Alert, Oriented, Affect-appropriate, Distress- none acute; obese Skin- rash-none, lesions- none, excoriation- none Lymphadenopathy- none Head- atraumatic  Eyes- Gross vision intact, PERRLA, conjunctivae clear secretions, no injection            Ears- Hearing, canals-normal            Nose- Clear, no-Septal dev, mucus, polyps, erosion, perforation             Throat- Mallampati II , mucosa clear , drainage- none, tonsils- atrophic, dentures Neck- flexible , trachea midline, no stridor , thyroid nl, carotid no bruit Chest - symmetrical excursion , unlabored           Heart/CV- RRR , no murmur , no gallop  , no rub, nl s1 s2                           - JVD- none , edema- none, stasis changes- none, varices- none           Lung- clear to P&A, wheeze- none, cough- none , dullness-none, rub- none           Chest wall-  Abd-  Br/ Gen/  Rectal- Not done, not indicated Extrem- cyanosis- none, clubbing, none, atrophy- none, strength- nl Neuro- grossly intact to observation

## 2011-07-25 NOTE — Patient Instructions (Signed)
Script to try Xopenex HFA rescue inhaler  For wheezing if needed  Try otc antihistamine Claritin/ loratadine  For itching, sneezing, drainage

## 2011-07-29 NOTE — Assessment & Plan Note (Signed)
Itching of eyes and nose associated with spring pollen season. Plan-Claritin

## 2011-07-29 NOTE — Assessment & Plan Note (Signed)
Plan- Xopenex inhaler

## 2011-08-12 ENCOUNTER — Other Ambulatory Visit: Payer: Self-pay | Admitting: Cardiology

## 2011-08-12 NOTE — Telephone Encounter (Signed)
Pt needs to obtain from her PCP since she has not been seen in the office in some time.  She can schedule an appointment with Dr Antoine Poche if necessary

## 2011-08-12 NOTE — Telephone Encounter (Signed)
Hi Pam I did not see where this patient had see Dr Antoine Poche excepted in the hospital. I did not know if I could fill this med. Please let me know what to do. Thanks Okey Dupre

## 2011-10-14 ENCOUNTER — Other Ambulatory Visit: Payer: Self-pay | Admitting: Cardiology

## 2011-10-17 ENCOUNTER — Telehealth: Payer: Self-pay | Admitting: Cardiology

## 2011-10-17 MED ORDER — METOPROLOL TARTRATE 25 MG PO TABS: 25.0000 mg | ORAL_TABLET | Freq: Two times a day (BID) | ORAL | Status: DC

## 2011-10-17 NOTE — Telephone Encounter (Signed)
Pt needs appointment then refill can be made Fax Received. Refill Completed. Davion Meara Chowoe (R.M.A)   

## 2011-10-17 NOTE — Telephone Encounter (Signed)
New Problem:    Patient called in needing a refill of her metoprolol tartrate (LOPRESSOR) 25 MG tablet.  Please call once the order has been placed.

## 2011-10-17 NOTE — Telephone Encounter (Deleted)
Pt need appt. Med was filled 08-14-11

## 2012-01-24 ENCOUNTER — Encounter: Payer: Self-pay | Admitting: Internal Medicine

## 2012-01-24 ENCOUNTER — Ambulatory Visit (INDEPENDENT_AMBULATORY_CARE_PROVIDER_SITE_OTHER): Payer: BC Managed Care – PPO | Admitting: Internal Medicine

## 2012-01-24 ENCOUNTER — Encounter: Payer: Self-pay | Admitting: *Deleted

## 2012-01-24 VITALS — BP 114/76 | HR 66 | Ht 60.0 in | Wt 231.8 lb

## 2012-01-24 DIAGNOSIS — J45909 Unspecified asthma, uncomplicated: Secondary | ICD-10-CM

## 2012-01-24 DIAGNOSIS — T6191XA Toxic effect of unspecified seafood, accidental (unintentional), initial encounter: Secondary | ICD-10-CM

## 2012-01-24 MED ORDER — LEVALBUTEROL TARTRATE 45 MCG/ACT IN AERO: 1.0000 | INHALATION_SPRAY | RESPIRATORY_TRACT | Status: DC | PRN

## 2012-01-24 NOTE — Progress Notes (Signed)
Patient ID: [name], female    DOB: [birth year], 66 y.o.   MRN: [id number]  Asthma Her past medical history is significant for asthma.   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 Here today for allergy testing.  Allergy profile broadly positive including some fish/ shrimp.  Off anthiistamines she notes sneeze in early AM and at night- when she sits out on porch. Eyes itch, nose waters, postnasal drip. Wheezes a little at night, less than in past, but especially as she lies down.  Skin test- Pos grass, trees, dust, and fish, shell fish, and some incidental additional foods.  She got note and will stay out of work when fish served and she says this as helped a lot. Discussed allergy vaccine, xolair.  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 She continues to skip work on days when fish is being served. No bad days since last here. 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 Has not needed rescue inhaler in years.   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 As long as she avoids fish she does well. Even the smell of fish cooking causes skin and eyes to itch and throat to close up. So far there has been no urticaria or wheezing. Her atrophic relation comes and goes. If it persists more than 2 hours she goes to the emergency room. This would be an issue if she needed to carry an EpiPen.  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 She has no inhaler.  Fish was taken off cafeteria line so she is doing better at work.   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 There have been no major events since last here. She stays away from the cafeteria on days when fish is served.  Review of Systems-see HPI Constitutional:   No-   weight loss, night sweats, fevers, chills, fatigue, lassitude. HEENT:   No-  headaches, difficulty swallowing, tooth/dental problems, sore throat,       No- recent sneezing ear ache, nasal congestion, post nasal drip, + itching CV:  No-   chest pain, orthopnea, PND, swelling in lower extremities, anasarca, dizziness,    + intermittent palpitations Resp: No-   shortness of breath with exertion or at rest.              No-   productive cough,  No non-productive cough,  No- coughing up of blood.              No-   change in color of mucus.  + wheezing.   Skin: No-   rash or lesions. GI:  No-   heartburn, indigestion, abdominal pain, nausea, vomiting,  GU:  MS:  No-   joint pain or swelling.   Neuro-  nothing unusual Psych:  No- change in mood or affect. No depression or anxiety.  No memory loss.   Objective:   Physical Exam General- Alert, Oriented, Affect-appropriate, Distress- none acute; obese Skin- rash-none, lesions- none, excoriation- none Lymphadenopathy- none Head- atraumatic            Eyes- Gross vision intact, PERRLA, conjunctivae clear secretions, no injection            Ears- Hearing, canals-normal            Nose- Clear, no-Septal dev, mucus, polyps, erosion, perforation             Throat- Mallampati II , mucosa clear , drainage- none, tonsils- atrophic, dentures Neck- flexible , trachea midline, no stridor , thyroid nl, carotid no  bruit Chest - symmetrical excursion , unlabored           Heart/CV- RRR , no murmur , no gallop  , no rub, nl s1 s2                           - JVD- none , edema- none, stasis changes- none, varices- none           Lung- clear to P&A, wheeze- none, cough- none , dullness-none, rub- none           Chest wall-  Abd-  Br/ Gen/ Rectal- Not done, not indicated Extrem- cyanosis- none, clubbing, none, atrophy- none, strength- nl Neuro- grossly intact to observation

## 2012-01-24 NOTE — Patient Instructions (Addendum)
Refill script for Xopenenx rescue inhaler  Please call as needed

## 2012-02-01 NOTE — Assessment & Plan Note (Signed)
She does not work in [employer] on days when fish served. That has been a necessary but sufficient way of controlling her allergic reaction. We can update her note to indicate this is an ongoing problem.

## 2012-02-01 NOTE — Assessment & Plan Note (Signed)
Plan-refill Xopenex inhaler so she will have access if needed.

## 2012-02-13 ENCOUNTER — Ambulatory Visit (INDEPENDENT_AMBULATORY_CARE_PROVIDER_SITE_OTHER): Payer: BC Managed Care – PPO | Admitting: Cardiology

## 2012-02-13 ENCOUNTER — Encounter: Payer: Self-pay | Admitting: Cardiology

## 2012-02-13 VITALS — BP 156/74 | HR 58 | Ht 60.0 in | Wt 232.6 lb

## 2012-02-13 DIAGNOSIS — I10 Essential (primary) hypertension: Secondary | ICD-10-CM

## 2012-02-13 DIAGNOSIS — I4891 Unspecified atrial fibrillation: Secondary | ICD-10-CM

## 2012-02-13 DIAGNOSIS — E669 Obesity, unspecified: Secondary | ICD-10-CM

## 2012-02-13 MED ORDER — METOPROLOL TARTRATE 25 MG PO TABS: 25.0000 mg | ORAL_TABLET | Freq: Two times a day (BID) | ORAL | Status: DC

## 2012-02-13 NOTE — Patient Instructions (Addendum)
The current medical regimen is effective;  continue present plan and medications.  Follow up in 2 years with Dr Hochrein.  You will receive a letter in the mail 2 months before you are due.  Please call us when you receive this letter to schedule your follow up appointment.  

## 2012-02-13 NOTE — Progress Notes (Signed)
   HPI The patient presents for followup of atrial fibrillation. She has had this documented previously. She's had infrequent episodes with probably 3 or 4 since the last time she saw me. The last episode was several months ago. The longest lasts for about 2-1/2 hours at a time. She simply weeks and let's then resolved. She's not had any presyncope or syncope. She's not had any chest pressure, neck or arm discomfort. She has no new shortness of breath, PND or orthopnea. She's had no edema. Of note she did have an unremarkable echocardiogram when I last saw her.  Allergies  Allergen Reactions  . Naproxen Sodium   . Penicillins   . Promethazine Hcl     Current Outpatient Prescriptions  Medication Sig Dispense Refill  . aspirin 325 MG tablet Take 325 mg by mouth daily.        . calcium carbonate (TUMS) 500 MG chewable tablet Take 1 to 2 tabs by mouth at bedtime.       . levalbuterol (XOPENEX HFA) 45 MCG/ACT inhaler Inhale 1 puff into the lungs every 4 (four) hours as needed for wheezing or shortness of breath.  1 Inhaler  prn  . metoprolol tartrate (LOPRESSOR) 25 MG tablet Take 1 tablet (25 mg total) by mouth 2 (two) times daily.  60 tablet  3    Past Medical History  Diagnosis Date  . PAF (paroxysmal atrial fibrillation)   . Borderline hypertension   . Borderline hyperlipidemia   . Hypothyroid     s/p PTU therapy    Past Surgical History  Procedure Date  . Thyroid surgery   . Cholecystectomy   . Leg surgery     Left leg d/t  tumor (benign)  . Cesarean section     ROS:  As stated in the HPI and negative for all other systems.  PHYSICAL EXAM BP 156/74  Pulse 58  Ht 5' (1.524 m)  Wt 232 lb 9.6 oz (105.507 kg)  BMI 45.43 kg/m2 GENERAL:  Well appearing HEENT:  Pupils equal round and reactive, fundi not visualized, oral mucosa unremarkable NECK:  No jugular venous distention, waveform within normal limits, carotid upstroke brisk and symmetric, no bruits, no thyromegaly LUNGS:   Clear to auscultation bilaterally BACK:  No CVA tenderness CHEST:  Unremarkable HEART:  PMI not displaced or sustained,S1 and S2 within normal limits, no S3, no S4, no clicks, no rubs, no murmurs ABD:  Flat, positive bowel sounds normal in frequency in pitch, no bruits, no rebound, no guarding, no midline pulsatile mass, no hepatomegaly, no splenomegaly EXT:  2 plus pulses throughout, no edema, no cyanosis no clubbing  EKG:  Sinus rhythm, rate 58, axis within normal limits, intervals within normal limits, no acute ST-T wave changes.   ASSESSMENT AND PLAN  ATRIAL FIBRILLATION, PAROXYSMAL  She has rare paroxysms. At this pont no change in therapy is indicated.  She will let me know if these increase in frequency or severity.  HYPERTENSION The blood pressure is at target. No change in medications is indicated. We will continue with therapeutic lifestyle changes (TLC).

## 2012-05-07 ENCOUNTER — Other Ambulatory Visit: Payer: Self-pay | Admitting: Gynecology

## 2012-05-07 DIAGNOSIS — Z1231 Encounter for screening mammogram for malignant neoplasm of breast: Secondary | ICD-10-CM

## 2012-06-02 ENCOUNTER — Ambulatory Visit
Admission: RE | Admit: 2012-06-02 | Discharge: 2012-06-02 | Disposition: A | Discharge status: Home / Self Care | Payer: BC Managed Care – PPO | Source: Ambulatory Visit | Attending: Gynecology | Admitting: Gynecology

## 2012-06-02 DIAGNOSIS — Z1231 Encounter for screening mammogram for malignant neoplasm of breast: Secondary | ICD-10-CM

## 2012-06-04 ENCOUNTER — Other Ambulatory Visit: Payer: Self-pay | Admitting: *Deleted

## 2012-06-04 DIAGNOSIS — N63 Unspecified lump in unspecified breast: Secondary | ICD-10-CM

## 2012-06-17 ENCOUNTER — Ambulatory Visit
Admission: RE | Admit: 2012-06-17 | Discharge: 2012-06-17 | Disposition: A | Discharge status: Home / Self Care | Payer: BC Managed Care – PPO | Source: Ambulatory Visit | Attending: Gynecology | Admitting: Gynecology

## 2012-06-17 DIAGNOSIS — N63 Unspecified lump in unspecified breast: Secondary | ICD-10-CM

## 2012-07-22 ENCOUNTER — Other Ambulatory Visit: Payer: Self-pay | Admitting: Orthopaedic Surgery

## 2012-07-22 DIAGNOSIS — M25561 Pain in right knee: Secondary | ICD-10-CM

## 2012-07-24 ENCOUNTER — Ambulatory Visit: Payer: BC Managed Care – PPO | Admitting: Internal Medicine

## 2012-07-30 ENCOUNTER — Ambulatory Visit
Admission: RE | Admit: 2012-07-30 | Discharge: 2012-07-30 | Disposition: A | Discharge status: Home / Self Care | Payer: BC Managed Care – PPO | Source: Ambulatory Visit | Attending: Orthopaedic Surgery | Admitting: Orthopaedic Surgery

## 2012-07-30 DIAGNOSIS — M25561 Pain in right knee: Secondary | ICD-10-CM

## 2012-08-07 ENCOUNTER — Ambulatory Visit (INDEPENDENT_AMBULATORY_CARE_PROVIDER_SITE_OTHER): Payer: BC Managed Care – PPO | Admitting: Internal Medicine

## 2012-08-07 ENCOUNTER — Encounter: Payer: Self-pay | Admitting: Internal Medicine

## 2012-08-07 VITALS — BP 150/70 | HR 59 | Ht 60.0 in | Wt 231.8 lb

## 2012-08-07 DIAGNOSIS — T6191XD Toxic effect of unspecified seafood, accidental (unintentional), subsequent encounter: Secondary | ICD-10-CM

## 2012-08-07 DIAGNOSIS — Z5189 Encounter for other specified aftercare: Secondary | ICD-10-CM

## 2012-08-07 NOTE — Patient Instructions (Addendum)
Please call as needed 

## 2012-08-07 NOTE — Progress Notes (Signed)
Patient ID: [name], female    DOB: 03/22/1946, 67 y.o.   MRN: [id number]  Asthma Her past medical history is significant for asthma.   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 Here today for allergy testing.  Allergy profile broadly positive including some fish/ shrimp.  Off anthiistamines she notes sneeze in early AM and at night- when she sits out on porch. Eyes itch, nose waters, postnasal drip. Wheezes a little at night, less than in past, but especially as she lies down.  Skin test- Pos grass, trees, dust, and fish, shell fish, and some incidental additional foods.  She got note and will stay out of work when fish served and she says this as helped a lot. Discussed allergy vaccine, xolair.  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 She continues to skip work on days when fish is being served. No bad days since last here. 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 Has not needed rescue inhaler in years.   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 As long as she avoids fish she does well. Even the smell of fish cooking causes skin and eyes to itch and throat to close up. So far there has been no urticaria or wheezing. Her atrophic relation comes and goes. If it persists more than 2 hours she goes to the emergency room. This would be an issue if she needed to carry an EpiPen.  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 She has no inhaler.  Fish was taken off cafeteria line so she is doing better at work.   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 There have been no major events since last here. She stays away from the cafeteria on days when fish is served. She is working every other Friday now, avoiding the days when fish is served in her cafeteria. That has significantly improved control of her rhinitis and asthma. She does not think pollen is bothering her.  Review of Systems-see HPI Constitutional:   No-   weight loss, night sweats, fevers, chills, fatigue, lassitude. HEENT:   No-  headaches, difficulty swallowing, tooth/dental problems, sore throat,       No- recent sneezing ear ache, nasal congestion, post nasal drip, + itching CV:  No-   chest pain, orthopnea, PND, swelling in lower extremities, anasarca, dizziness,                                       +intermittent palpitations Resp: No-   shortness of breath with exertion or at rest.              No-   productive cough,  No non-productive cough,  No- coughing up of blood.              No-   change in color of mucus.  + wheezing.   Skin: No-   rash or lesions. GI:  No-   heartburn, indigestion, abdominal pain, nausea, vomiting,  GU:  MS:  No-   joint pain or swelling.   Neuro-     nothing unusual Psych:  No- change in mood or affect. No depression or anxiety.  No memory loss.   Objective:   Physical Exam General- Alert, Oriented, Affect-appropriate, Distress- none acute; obese Skin- rash-none, lesions- none, excoriation- none Lymphadenopathy- none Head- atraumatic            Eyes- Gross vision intact, PERRLA, conjunctivae clear secretions, no injection            Ears- Hearing, canals-normal             Nose- Clear, no-Septal dev, mucus, polyps, erosion, perforation             Throat- Mallampati II , mucosa clear , drainage- none, tonsils- atrophic, dentures Neck- flexible , trachea midline, no stridor , thyroid nl, carotid no bruit Chest - symmetrical excursion , unlabored           Heart/CV- RRR , no murmur , no gallop  , no rub, nl s1 s2                           - JVD- none , edema- none, stasis changes- none, varices- none           Lung- clear to P&A, wheeze- none, cough- none , dullness-none, rub- none           Chest wall-  Abd-  Br/ Gen/ Rectal- Not done, not indicated Extrem- cyanosis- none, clubbing, none, atrophy- none, strength- nl Neuro- grossly intact to observation

## 2012-08-18 NOTE — Assessment & Plan Note (Signed)
Good control, especially due to avoidance of exposure to fish and shellfish at work in [employer].

## 2012-09-30 ENCOUNTER — Ambulatory Visit
Admission: RE | Admit: 2012-09-30 | Discharge: 2012-09-30 | Disposition: A | Discharge status: Home / Self Care | Payer: BC Managed Care – PPO | Source: Ambulatory Visit | Attending: Orthopaedic Surgery | Admitting: Orthopaedic Surgery

## 2012-09-30 ENCOUNTER — Other Ambulatory Visit: Payer: Self-pay | Admitting: Orthopaedic Surgery

## 2012-09-30 DIAGNOSIS — R609 Edema, unspecified: Secondary | ICD-10-CM

## 2012-10-14 ENCOUNTER — Encounter (HOSPITAL_COMMUNITY): Payer: Self-pay

## 2012-10-14 ENCOUNTER — Emergency Department (HOSPITAL_COMMUNITY)
Admission: EM | Admit: 2012-10-14 | Discharge: 2012-10-14 | Disposition: A | Discharge status: Home / Self Care | Payer: BC Managed Care – PPO | Attending: Emergency Medicine | Admitting: Emergency Medicine

## 2012-10-14 ENCOUNTER — Emergency Department (HOSPITAL_COMMUNITY): Payer: BC Managed Care – PPO

## 2012-10-14 DIAGNOSIS — Z87891 Personal history of nicotine dependence: Secondary | ICD-10-CM | POA: Insufficient documentation

## 2012-10-14 DIAGNOSIS — R002 Palpitations: Secondary | ICD-10-CM | POA: Insufficient documentation

## 2012-10-14 DIAGNOSIS — I4891 Unspecified atrial fibrillation: Secondary | ICD-10-CM | POA: Insufficient documentation

## 2012-10-14 DIAGNOSIS — Z862 Personal history of diseases of the blood and blood-forming organs and certain disorders involving the immune mechanism: Secondary | ICD-10-CM | POA: Insufficient documentation

## 2012-10-14 DIAGNOSIS — Z8639 Personal history of other endocrine, nutritional and metabolic disease: Secondary | ICD-10-CM | POA: Insufficient documentation

## 2012-10-14 DIAGNOSIS — Z880 Allergy status to penicillin: Secondary | ICD-10-CM | POA: Insufficient documentation

## 2012-10-14 DIAGNOSIS — Z79899 Other long term (current) drug therapy: Secondary | ICD-10-CM | POA: Insufficient documentation

## 2012-10-14 DIAGNOSIS — I10 Essential (primary) hypertension: Secondary | ICD-10-CM | POA: Insufficient documentation

## 2012-10-14 DIAGNOSIS — Z7982 Long term (current) use of aspirin: Secondary | ICD-10-CM | POA: Insufficient documentation

## 2012-10-14 LAB — CBC
HCT: 40.5 % (ref 36.0–46.0)
Hemoglobin: 13.6 g/dL (ref 12.0–15.0)
MCH: 29.4 pg (ref 26.0–34.0)
MCHC: 33.6 g/dL (ref 30.0–36.0)
MCV: 87.5 fL (ref 78.0–100.0)
Platelets: 248 10*3/uL (ref 150–400)
RBC: 4.63 MIL/uL (ref 3.87–5.11)
RDW: 14 % (ref 11.5–15.5)
WBC: 10.5 10*3/uL (ref 4.0–10.5)

## 2012-10-14 LAB — BASIC METABOLIC PANEL
BUN: 13 mg/dL (ref 6–23)
CO2: 30 mEq/L (ref 19–32)
Calcium: 9 mg/dL (ref 8.4–10.5)
Chloride: 104 mEq/L (ref 96–112)
Creatinine, Ser: 0.7 mg/dL (ref 0.50–1.10)
GFR calc Af Amer: >90 mL/min (ref >90)
GFR calc non Af Amer: 89 mL/min — ABNORMAL LOW (ref >90)
Glucose, Bld: 110 mg/dL — ABNORMAL HIGH (ref 70–99)
Potassium: 3.6 mEq/L (ref 3.5–5.1)
Sodium: 142 mEq/L (ref 135–145)

## 2012-10-14 LAB — POCT I-STAT TROPONIN I: Troponin i, poc: 0.01 ng/mL (ref 0.00–0.08)

## 2012-10-14 LAB — PROTIME-INR
INR: 0.96 (ref 0.00–1.49)
Prothrombin Time: 12.6 seconds (ref 11.6–15.2)

## 2012-10-14 NOTE — ED Provider Notes (Signed)
History    CSN: [id number] Arrival date & time 10/14/12  1651  First MD Initiated Contact with Patient 10/14/12 1758     Chief Complaint  Patient presents with  . Irregular Heart Beat   (Consider location/radiation/quality/duration/timing/severity/associated sxs/prior Treatment) HPI  Patient presents with an episode of palpitations.  She states that symptoms began approximately 2 hours prior to my evaluation, and to stop prior to my evaluation. She was walking when her symptoms began, and there is no other clear precipitant, no new medication, no new activity. Symptoms do not include chest pain, syncope, dyspnea. Symptoms resolved without clear intervention. Patient states that she has had multiple prior similar events over the past years. She is a diagnosis of atrial fibrillation, takes aspirin daily, takes metoprolol daily.  Past Medical History  Diagnosis Date  . PAF (paroxysmal atrial fibrillation)   . Borderline hypertension   . Borderline hyperlipidemia   . Hypothyroid     s/p PTU therapy   Past Surgical History  Procedure Laterality Date  . Thyroid surgery    . Cholecystectomy    . Leg surgery      Left leg d/t  tumor (benign)  . Cesarean section     Family History  Problem Relation Age of Onset  . Lung cancer Father   . Breast cancer Mother   . Cirrhosis Brother   . Diabetes Brother   . Breast cancer Sister    History  Substance Use Topics  . Smoking status: Former Smoker -- 0.80 packs/day for 5 years    Types: Cigarettes    Quit date: 04/08/1982  . Smokeless tobacco: Never Used  . Alcohol Use: No   OB History   Grav Para Term Preterm Abortions TAB SAB Ect Mult Living                 Review of Systems  Constitutional:       Per HPI, otherwise negative  HENT:       Per HPI, otherwise negative  Respiratory:       Per HPI, otherwise negative  Cardiovascular:       Per HPI, otherwise negative  Gastrointestinal: Negative for vomiting.   Endocrine:       Negative aside from HPI  Genitourinary:       Neg aside from HPI   Musculoskeletal:       Recent right knee arthroscopic procedure, no complications, no new medication  Skin: Negative.   Neurological: Negative for syncope.    Allergies  Naproxen sodium; Penicillins; and Promethazine hcl  Home Medications   Current Outpatient Rx  Name  Route  Sig  Dispense  Refill  . aspirin 325 MG tablet   Oral   Take 325 mg by mouth daily.          . calcium carbonate (TUMS) 500 MG chewable tablet      Take 1 to 2 tabs by mouth at bedtime.          . levalbuterol (XOPENEX HFA) 45 MCG/ACT inhaler   Inhalation   Inhale 1 puff into the lungs every 4 (four) hours as needed for wheezing or shortness of breath.   1 Inhaler   prn   . metoprolol tartrate (LOPRESSOR) 25 MG tablet   Oral   Take 1 tablet (25 mg total) by mouth 2 (two) times daily.   60 tablet   11   . omeprazole (PRILOSEC) 10 MG capsule   Oral   Take 10 mg  by mouth daily as needed.          BP 135/50  Pulse 58  Temp(Src) 98.3 F (36.8 C) (Oral)  Resp 16  Ht 5' (1.524 m)  Wt 225 lb (102.059 kg)  BMI 43.94 kg/m2  SpO2 100% Physical Exam  Nursing note and vitals reviewed. Constitutional: She is oriented to person, place, and time. She appears well-developed and well-nourished. No distress.  HENT:  Head: Normocephalic and atraumatic.  Eyes: Conjunctivae and EOM are normal.  Cardiovascular: Normal rate and regular rhythm.   Pulmonary/Chest: Effort normal and breath sounds normal. No stridor. No respiratory distress.  Abdominal: She exhibits no distension.  Musculoskeletal: She exhibits no edema.  Right knee without significant erythema, edema, or any complaints on the part of the patient.  Neurological: She is alert and oriented to person, place, and time. No cranial nerve deficit.  Skin: Skin is warm and dry.  Psychiatric: She has a normal mood and affect.    ED Course  Procedures  (including critical care time) Labs Reviewed  BASIC METABOLIC PANEL - Abnormal; Notable for the following:    Glucose, Bld 110 (*)    GFR calc non Af Amer 89 (*)    All other components within normal limits  CBC  PROTIME-INR  POCT I-STAT TROPONIN I   Dg Chest 2 View  10/14/2012   *RADIOLOGY REPORT*  Clinical Data: Irregular heart beat, hypertension.  CHEST - 2 VIEW  Comparison: 07/31/2009  Findings: Heart and mediastinal contours are within normal limits. No focal opacities or effusions.  No acute bony abnormality.  IMPRESSION: No active cardiopulmonary disease.   Original Report Authenticated By: Charlett Nose, M.D.   No diagnosis found. Pulse ox 99% remainder normal Cardiac 91 sinus rhythm normal   Date: 10/14/2012  Rate: 92  Rhythm: normal sinus rhythm  QRS Axis: normal  Intervals: normal  ST/T Wave abnormalities: normal  Conduction Disutrbances: none  Narrative Interpretation: unremarkable    7:11 PM Patient in no distress  MDM  This patient with history of atrial fibrillation, anticoagulated with.  Aspirin presents after a characteristically similar episode of palpitations and tachycardia.  Prior to my evaluation her symptoms resolved and on my exam and appearing in emergency department observation, she remained in no distress with no new complaints or any evidence of early decompensation.  Labs are reassuring, she was discharged in stable condition to follow up with her cardiologist did telephone tomorrow.  Gerhard Munch, MD 10/14/12 [phone]

## 2012-10-14 NOTE — ED Notes (Signed)
Pt. Reports heart began racing with chest pain,  Pt. Reports it being as high as 150.  This has happened before.  Presently skin is p.w.d, resp. E/u, Pt.s heart rate is 92.  Pt. Does report having waves of nausea.  Presenlty denies any nausea

## 2012-11-11 ENCOUNTER — Other Ambulatory Visit: Payer: Self-pay

## 2012-11-16 ENCOUNTER — Other Ambulatory Visit: Payer: Self-pay | Admitting: Gynecology

## 2012-11-16 DIAGNOSIS — N63 Unspecified lump in unspecified breast: Secondary | ICD-10-CM

## 2012-12-21 ENCOUNTER — Ambulatory Visit
Admission: RE | Admit: 2012-12-21 | Discharge: 2012-12-21 | Disposition: A | Discharge status: Home / Self Care | Payer: BC Managed Care – PPO | Source: Ambulatory Visit | Attending: Gynecology | Admitting: Gynecology

## 2012-12-21 DIAGNOSIS — N63 Unspecified lump in unspecified breast: Secondary | ICD-10-CM

## 2013-02-08 ENCOUNTER — Ambulatory Visit: Payer: BC Managed Care – PPO | Admitting: Internal Medicine

## 2013-02-11 ENCOUNTER — Other Ambulatory Visit: Payer: Self-pay

## 2013-02-18 ENCOUNTER — Other Ambulatory Visit: Payer: Self-pay | Admitting: Cardiology

## 2013-03-25 ENCOUNTER — Encounter: Payer: Self-pay | Admitting: Internal Medicine

## 2013-03-25 ENCOUNTER — Ambulatory Visit (INDEPENDENT_AMBULATORY_CARE_PROVIDER_SITE_OTHER): Payer: BC Managed Care – PPO | Admitting: Internal Medicine

## 2013-03-25 VITALS — BP 120/80 | HR 64 | Ht 59.5 in | Wt 226.0 lb

## 2013-03-25 DIAGNOSIS — J309 Allergic rhinitis, unspecified: Secondary | ICD-10-CM

## 2013-03-25 DIAGNOSIS — J302 Other seasonal allergic rhinitis: Secondary | ICD-10-CM

## 2013-03-25 DIAGNOSIS — T6191XA Toxic effect of unspecified seafood, accidental (unintentional), initial encounter: Secondary | ICD-10-CM

## 2013-03-25 NOTE — Patient Instructions (Signed)
Continue to avoid the triggers, like fish, that bother you.  Sample Dymista nasal spray   1-2 puffs each nostril once daily at bedtime  Note for work about allergy to fish.

## 2013-03-25 NOTE — Progress Notes (Signed)
Patient ID: [name], female    DOB: 10-11-1945, 67 y.o.   MRN: [id number]  Asthma Her past medical history is significant for asthma.   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 Here today for allergy testing.  Allergy profile broadly positive including some fish/ shrimp.  Off anthiistamines she notes sneeze in early AM and at night- when she sits out on porch. Eyes itch, nose waters, postnasal drip. Wheezes a little at night, less than in past, but especially as she lies down.  Skin test- Pos grass, trees, dust, and fish, shell fish, and some incidental additional foods.  She got note and will stay out of work when fish served and she says this as helped a lot. Discussed allergy vaccine, xolair.  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 She continues to skip work on days when fish is being served. No bad days since last here. 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 Has not needed rescue inhaler in years.   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 As long as she avoids fish she does well. Even the smell of fish cooking causes skin and eyes to itch and throat to close up. So far there has been no urticaria or wheezing. Her atrophic relation comes and goes. If it persists more than 2 hours she goes to the emergency room. This would be an issue if she needed to carry an EpiPen.  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 She has no inhaler.  Fish was taken off cafeteria line so she is doing better at work.   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 There have been no major events since last here. She stays away from the cafeteria on days when fish is served. She is working every other Friday now, avoiding the days when fish is served in her cafeteria. That has significantly improved control of her rhinitis and asthma. She does not think pollen is bothering her.  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  Allergies are unchanged. No new complaints.  Complains of postnasal drip off-and-on, chronic, increased in past week. Antihistamines make her uncomfortably dry. CXR 10/14/12 IMPRESSION:  No active cardiopulmonary disease.  Original Report Authenticated By: Charlett Nose, M.D.  Review of Systems-see HPI Constitutional:   No-   weight loss, night sweats, fevers, chills, fatigue, lassitude. HEENT:   No-  headaches, difficulty swallowing, tooth/dental problems, sore throat,       No- recent sneezing ear ache, nasal congestion, +post nasal drip, + itching CV:  No-   chest pain, orthopnea, PND, swelling in lower extremities, anasarca,  dizziness,                                       +intermittent palpitations Resp: No-   shortness of breath with exertion or at rest.              No-   productive cough,  No non-productive cough,  No- coughing up of blood.              No-   change in color of mucus.  + wheezing.   Skin: No-   rash or lesions. GI:  No-   heartburn, indigestion, abdominal pain, nausea, vomiting,  GU:  MS:  No-    joint pain or swelling.   Neuro-     nothing unusual Psych:  No- change in mood or affect. No depression or anxiety.  No memory loss.  Objective:   Physical Exam General- Alert, Oriented, Affect-appropriate, Distress- none acute; obese Skin- rash-none, lesions- none, excoriation- none Lymphadenopathy- none Head- atraumatic            Eyes- Gross vision intact, PERRLA, conjunctivae clear secretions, no injection            Ears- Hearing, canals-normal            Nose- +mucus crust, no-Septal dev, mucus, polyps, erosion, perforation             Throat- Mallampati III , mucosa clear , drainage- none, tonsils- atrophic, dentures Neck- flexible , trachea midline, no stridor , thyroid nl, carotid no bruit Chest - symmetrical excursion , unlabored           Heart/CV- RRR , no murmur , no gallop  , no rub, nl s1 s2                           - JVD- none , edema- none, stasis changes- none, varices- none           Lung- clear to P&A, wheeze- none, cough- none , dullness-none, rub- none           Chest wall-  Abd-  Br/ Gen/ Rectal- Not done, not indicated Extrem- cyanosis- none, clubbing, none, atrophy- none, strength- nl Neuro- grossly intact to observation

## 2013-04-21 NOTE — Assessment & Plan Note (Signed)
Discussed environmental controls. Plan-sample Dymista

## 2013-04-21 NOTE — Assessment & Plan Note (Signed)
Continues to strictly avoid exposure to seafood

## 2013-04-30 ENCOUNTER — Other Ambulatory Visit: Payer: Self-pay | Admitting: Gynecology

## 2013-04-30 ENCOUNTER — Other Ambulatory Visit: Payer: Self-pay

## 2013-04-30 DIAGNOSIS — N6310 Unspecified lump in the right breast, unspecified quadrant: Secondary | ICD-10-CM

## 2013-05-19 ENCOUNTER — Other Ambulatory Visit: Payer: Self-pay | Admitting: Orthopaedic Surgery

## 2013-05-19 DIAGNOSIS — M25562 Pain in left knee: Secondary | ICD-10-CM

## 2013-05-20 ENCOUNTER — Ambulatory Visit
Admission: RE | Admit: 2013-05-20 | Discharge: 2013-05-20 | Disposition: A | Discharge status: Home / Self Care | Payer: BC Managed Care – PPO | Source: Ambulatory Visit | Attending: Orthopaedic Surgery | Admitting: Orthopaedic Surgery

## 2013-05-20 DIAGNOSIS — M25562 Pain in left knee: Secondary | ICD-10-CM

## 2013-06-04 ENCOUNTER — Ambulatory Visit
Admission: RE | Admit: 2013-06-04 | Discharge: 2013-06-04 | Disposition: A | Discharge status: Home / Self Care | Payer: BC Managed Care – PPO | Source: Ambulatory Visit

## 2013-06-04 DIAGNOSIS — N6310 Unspecified lump in the right breast, unspecified quadrant: Secondary | ICD-10-CM

## 2013-06-14 ENCOUNTER — Other Ambulatory Visit: Payer: Self-pay | Admitting: Orthopaedic Surgery

## 2013-06-14 ENCOUNTER — Ambulatory Visit
Admission: RE | Admit: 2013-06-14 | Discharge: 2013-06-14 | Disposition: A | Discharge status: Home / Self Care | Payer: BC Managed Care – PPO | Source: Ambulatory Visit | Attending: Orthopaedic Surgery | Admitting: Orthopaedic Surgery

## 2013-06-14 DIAGNOSIS — R609 Edema, unspecified: Secondary | ICD-10-CM

## 2013-08-24 ENCOUNTER — Other Ambulatory Visit: Payer: Self-pay | Admitting: Cardiology

## 2013-09-23 ENCOUNTER — Ambulatory Visit: Payer: BC Managed Care – PPO | Admitting: Internal Medicine

## 2013-09-24 ENCOUNTER — Other Ambulatory Visit: Payer: Self-pay

## 2013-09-24 MED ORDER — METOPROLOL TARTRATE 25 MG PO TABS: ORAL_TABLET | ORAL | Status: DC

## 2013-10-19 ENCOUNTER — Other Ambulatory Visit: Payer: Self-pay | Admitting: Cardiology

## 2014-01-17 ENCOUNTER — Encounter: Payer: Self-pay | Admitting: Cardiology

## 2014-01-17 ENCOUNTER — Ambulatory Visit (INDEPENDENT_AMBULATORY_CARE_PROVIDER_SITE_OTHER): Payer: Medicare Other | Admitting: Cardiology

## 2014-01-17 VITALS — BP 140/86 | HR 63 | Ht 59.0 in | Wt 228.9 lb

## 2014-01-17 DIAGNOSIS — I480 Paroxysmal atrial fibrillation: Secondary | ICD-10-CM

## 2014-01-17 DIAGNOSIS — I10 Essential (primary) hypertension: Secondary | ICD-10-CM

## 2014-01-17 MED ORDER — METOPROLOL TARTRATE 25 MG PO TABS
ORAL_TABLET | ORAL | Status: DC
Start: 2014-01-17 — End: 2015-02-03

## 2014-01-17 NOTE — Patient Instructions (Signed)
Your physician recommends that you schedule a follow-up appointment in: one year  With Dr. Hochrein  

## 2014-01-17 NOTE — Progress Notes (Signed)
   HPI The patient presents for followup of atrial fibrillation. She has had this documented previously. She reports a stable pattern of this with an episode about every 3 to 4 months.  She has had no hospital visits since 7/14.  At that time she had palpitations but no documented afib.  Her episodes might last for about 3 hours and leave her drained.  She does not have chest pain during the episodes.  She simply weeks and let's then resolved. She's not had any presyncope or syncope. She's not had any chest pressure, neck or arm discomfort. She has no new shortness of breath, PND or orthopnea. She's had no edema.   Allergies  Allergen Reactions  . Naproxen Sodium     Hurt all over  . Penicillins     Hives   . Promethazine Hcl     Sick     Current Outpatient Prescriptions  Medication Sig Dispense Refill  . aspirin 325 MG tablet Take 325 mg by mouth daily as needed.       . calcium carbonate (TUMS) 500 MG chewable tablet Take 1 to 2 tabs by mouth at bedtime, as needed      . metoprolol tartrate (LOPRESSOR) 25 MG tablet TAKE ONE TABLET BY MOUTH TWICE DAILY  60 tablet  3  . omeprazole (PRILOSEC) 10 MG capsule Take 10 mg by mouth daily as needed.       No current facility-administered medications for this visit.    Past Medical History  Diagnosis Date  . PAF (paroxysmal atrial fibrillation)   . Borderline hypertension   . Borderline hyperlipidemia   . Hypothyroid     s/p PTU therapy    Past Surgical History  Procedure Laterality Date  . Thyroid surgery    . Cholecystectomy    . Leg surgery      Left leg d/t  tumor (benign)  . Cesarean section      ROS:  As stated in the HPI and negative for all other systems.  PHYSICAL EXAM BP 140/86  Pulse 63  Ht 4\' 11"  (1.499 m)  Wt 228 lb 14.4 oz (103.828 kg)  BMI 46.21 kg/m2 GENERAL:  Well appearing NECK:  No jugular venous distention, waveform within normal limits, carotid upstroke brisk and symmetric, no bruits, no  thyromegaly LUNGS:  Clear to auscultation bilaterally CHEST:  Unremarkable HEART:  PMI not displaced or sustained,S1 and S2 within normal limits, no S3, no S4, no clicks, no rubs, no murmurs ABD:  Flat, positive bowel sounds normal in frequency in pitch, no bruits, no rebound, no guarding, no midline pulsatile mass, no hepatomegaly, no splenomegaly EXT:  2 plus pulses throughout, no edema, no cyanosis no clubbing  EKG:  Sinus rhythm, rate 63, axis within normal limits, intervals within normal limits, no acute ST-T wave changes.  01/17/2014   ASSESSMENT AND PLAN  ATRIAL FIBRILLATION, PAROXYSMAL  She has rare paroxysms and for now she wants conservative therapy without antiarrhythmic therapy.  She will let me know if these increase in frequency or severity.  HYPERTENSION The blood pressure is at target. No change in medications is indicated. We will continue with therapeutic lifestyle changes (TLC).

## 2014-01-21 ENCOUNTER — Other Ambulatory Visit: Payer: Self-pay

## 2014-05-05 ENCOUNTER — Other Ambulatory Visit: Payer: Self-pay

## 2014-05-05 DIAGNOSIS — Z1231 Encounter for screening mammogram for malignant neoplasm of breast: Secondary | ICD-10-CM

## 2014-06-06 ENCOUNTER — Ambulatory Visit
Admission: RE | Admit: 2014-06-06 | Discharge: 2014-06-06 | Disposition: A | Discharge status: Home / Self Care | Payer: Medicare Other | Source: Ambulatory Visit

## 2014-06-06 DIAGNOSIS — Z1231 Encounter for screening mammogram for malignant neoplasm of breast: Secondary | ICD-10-CM

## 2014-10-03 ENCOUNTER — Other Ambulatory Visit: Payer: Self-pay

## 2014-12-02 ENCOUNTER — Telehealth: Payer: Self-pay | Admitting: Cardiology

## 2014-12-06 NOTE — Telephone Encounter (Signed)
Close encounter 

## 2015-02-01 ENCOUNTER — Encounter: Payer: Self-pay | Admitting: Cardiology

## 2015-02-01 ENCOUNTER — Ambulatory Visit: Payer: Medicare Other | Admitting: Cardiology

## 2015-02-01 VITALS — BP 124/76 | HR 52 | Ht 60.0 in | Wt 226.5 lb

## 2015-02-01 DIAGNOSIS — I480 Paroxysmal atrial fibrillation: Secondary | ICD-10-CM

## 2015-02-01 NOTE — Progress Notes (Signed)
   HPI The patient presents for followup of atrial fibrillation. She has had this documented previously. She reports a stable pattern of this with an episode about every 3 to 6 months.  She has had no hospital visits since 7/14.  At that time she had palpitations but no documented afib.  Her episodes might last for about 3 hours and leave her drained.  She does not have chest pain during the episodes.  She simply weeks and let's then resolved. She's not had any presyncope or syncope. She's not had any chest pressure, neck or arm discomfort. She has no new shortness of breath, PND or orthopnea. She's had no edema.   Allergies  Allergen Reactions  . Naproxen Sodium     Hurt all over  . Penicillins     Hives   . Promethazine Hcl     Sick     Current Outpatient Prescriptions  Medication Sig Dispense Refill  . aspirin 325 MG tablet Take 325 mg by mouth daily as needed.     . calcium carbonate (TUMS) 500 MG chewable tablet Take 1 to 2 tabs by mouth at bedtime, as needed    . metoprolol tartrate (LOPRESSOR) 25 MG tablet TAKE ONE TABLET BY MOUTH TWICE DAILY 60 tablet 11  . omeprazole (PRILOSEC) 10 MG capsule Take 10 mg by mouth daily as needed.     No current facility-administered medications for this visit.    Past Medical History  Diagnosis Date  . PAF (paroxysmal atrial fibrillation)   . Borderline hypertension   . Borderline hyperlipidemia   . Hypothyroid     s/p PTU therapy    Past Surgical History  Procedure Laterality Date  . Thyroid surgery    . Cholecystectomy    . Leg surgery      Left leg d/t  tumor (benign)  . Cesarean section    . Knee surgery      right 2014, left 2015, arthroscipic    ROS:  As stated in the HPI and negative for all other systems.  PHYSICAL EXAM BP 124/76 mmHg  Pulse 52  Ht 5' (1.524 m)  Wt 226 lb 8 oz (102.74 kg)  BMI 44.24 kg/m2 GENERAL:  Well appearing NECK:  No jugular venous distention, waveform within normal limits, carotid upstroke  brisk and symmetric, no bruits, no thyromegaly LUNGS:  Clear to auscultation bilaterally CHEST:  Unremarkable HEART:  PMI not displaced or sustained,S1 and S2 within normal limits, no S3, no S4, no clicks, no rubs, no murmurs ABD:  Flat, positive bowel sounds normal in frequency in pitch, no bruits, no rebound, no guarding, no midline pulsatile mass, no hepatomegaly, no splenomegaly EXT:  2 plus pulses throughout, no edema, no cyanosis no clubbing  EKG:  Sinus rhythm, rate 52, axis within normal limits, intervals within normal limits, no acute ST-T wave changes.  02/01/2015   ASSESSMENT AND PLAN  ATRIAL FIBRILLATION, PAROXYSMAL  She has rare paroxysms and for now she wants conservative therapy without antiarrhythmic therapy.  [name] has a CHA2DS2 - VASc score of 2 with a risk of stroke of 2.2%.  However, she prefers not to take anticoagulation.  We talked about this risk.   HYPERTENSION The blood pressure is at target. No change in medications is indicated. We will continue with therapeutic lifestyle changes (TLC).

## 2015-02-01 NOTE — Patient Instructions (Signed)
Your physician wants you to follow-up in: 1 Year. You will receive a reminder letter in the mail two months in advance. If you don't receive a letter, please call our office to schedule the follow-up appointment.  

## 2015-02-03 ENCOUNTER — Other Ambulatory Visit: Payer: Self-pay

## 2015-02-03 ENCOUNTER — Telehealth: Payer: Self-pay | Admitting: Cardiology

## 2015-02-03 MED ORDER — METOPROLOL TARTRATE 25 MG PO TABS: ORAL_TABLET | ORAL | Status: DC

## 2015-02-03 NOTE — Telephone Encounter (Signed)
metoprolol tartrate (LOPRESSOR) 25 MG tablet Needs refills send to [facility] off of [address]

## 2015-05-18 ENCOUNTER — Other Ambulatory Visit: Payer: Self-pay

## 2015-05-18 DIAGNOSIS — Z1231 Encounter for screening mammogram for malignant neoplasm of breast: Secondary | ICD-10-CM

## 2015-06-08 ENCOUNTER — Ambulatory Visit
Admission: RE | Admit: 2015-06-08 | Discharge: 2015-06-08 | Disposition: A | Discharge status: Home / Self Care | Payer: Medicare Other | Source: Ambulatory Visit

## 2015-06-08 DIAGNOSIS — Z1231 Encounter for screening mammogram for malignant neoplasm of breast: Secondary | ICD-10-CM

## 2015-11-09 ENCOUNTER — Encounter: Payer: Self-pay | Admitting: Gastroenterology

## 2016-01-25 ENCOUNTER — Encounter: Payer: Self-pay | Admitting: Cardiology

## 2016-02-07 NOTE — Progress Notes (Signed)
  HPI The patient presents for followup of atrial fibrillation.  Since she was last seen she has had no new complaints.  She does feel some palpitations.  The patient denies any new symptoms such as chest discomfort, neck or arm discomfort. There has been no new shortness of breath, PND or orthopnea. There has been no reported presyncope or syncope.  Palpitations that she does have are very infrequent now and short lived. She has no sustained symptoms. She's being active taking care of a 68-year-old grandson. Her daughter is now expecting twins.   Allergies  Allergen Reactions  . Naproxen Sodium     Hurt all over  . Penicillins     Hives   . Promethazine Hcl     Sick     Current Outpatient Prescriptions  Medication Sig Dispense Refill  . aspirin 325 MG tablet Take 325 mg by mouth daily as needed.     . calcium carbonate (TUMS) 500 MG chewable tablet Take 1 to 2 tabs by mouth at bedtime, as needed    . metoprolol tartrate (LOPRESSOR) 25 MG tablet TAKE ONE TABLET BY MOUTH TWICE DAILY 60 tablet 11  . omeprazole (PRILOSEC) 10 MG capsule Take 10 mg by mouth daily as needed.     No current facility-administered medications for this visit.     Past Medical History:  Diagnosis Date  . Borderline hyperlipidemia   . Borderline hypertension   . Hypothyroid    s/p PTU therapy  . PAF (paroxysmal atrial fibrillation) ([place])     Past Surgical History:  Procedure Laterality Date  . CESAREAN SECTION    . CHOLECYSTECTOMY    . KNEE SURGERY     right 2014, left 2015, arthroscipic  . LEG SURGERY     Left leg d/t  tumor (benign)  . THYROID SURGERY      ROS:  As stated in the HPI and negative for all other systems.  PHYSICAL EXAM BP 134/72   Pulse (!) 56   Ht 5' (1.524 m)   Wt 227 lb (103 kg)   BMI 44.33 kg/m  GENERAL:  Well appearing NECK:  No jugular venous distention, waveform within normal limits, carotid upstroke brisk and symmetric, no bruits, no thyromegaly LUNGS:  Clear to  auscultation bilaterally CHEST:  Unremarkable HEART:  PMI not displaced or sustained,S1 and S2 within normal limits, no S3, no S4, no clicks, no rubs, no murmurs ABD:  Flat, positive bowel sounds normal in frequency in pitch, no bruits, no rebound, no guarding, no midline pulsatile mass, no hepatomegaly, no splenomegaly EXT:  2 plus pulses throughout, no edema, no cyanosis no clubbing  EKG:  Sinus rhythm, rate 56, axis within normal limits, intervals within normal limits, no acute ST-T wave changes.  02/08/2016   ASSESSMENT AND PLAN  ATRIAL FIBRILLATION, PAROXYSMAL  She has rare paroxysms and for now she wants conservative therapy without antiarrhythmic therapy.  [name] has a CHA2DS2 - VASc score of 2 with a risk of stroke of 2.2%.  However, she prefers not to take anticoagulation.  We talked about this risk.   HYPERTENSION The blood pressure is at target. No change in medications is indicated. We will continue with therapeutic lifestyle changes (TLC).

## 2016-02-08 ENCOUNTER — Ambulatory Visit (INDEPENDENT_AMBULATORY_CARE_PROVIDER_SITE_OTHER): Payer: Medicare Other | Admitting: Cardiology

## 2016-02-08 ENCOUNTER — Encounter: Payer: Self-pay | Admitting: Cardiology

## 2016-02-08 VITALS — BP 134/72 | HR 56 | Ht 60.0 in | Wt 227.0 lb

## 2016-02-08 DIAGNOSIS — I10 Essential (primary) hypertension: Secondary | ICD-10-CM | POA: Diagnosis not present

## 2016-02-08 DIAGNOSIS — I480 Paroxysmal atrial fibrillation: Secondary | ICD-10-CM

## 2016-02-08 NOTE — Patient Instructions (Signed)

## 2016-02-09 ENCOUNTER — Other Ambulatory Visit: Payer: Self-pay | Admitting: *Deleted

## 2016-02-09 MED ORDER — METOPROLOL TARTRATE 25 MG PO TABS: ORAL_TABLET | ORAL | 11 refills | Status: DC

## 2016-03-22 ENCOUNTER — Encounter (HOSPITAL_COMMUNITY): Payer: Self-pay | Admitting: Emergency Medicine

## 2016-03-22 ENCOUNTER — Emergency Department (HOSPITAL_COMMUNITY)
Admission: EM | Admit: 2016-03-22 | Discharge: 2016-03-22 | Disposition: A | Discharge status: Home / Self Care | Payer: Medicare Other | Attending: Emergency Medicine | Admitting: Emergency Medicine

## 2016-03-22 DIAGNOSIS — R002 Palpitations: Secondary | ICD-10-CM | POA: Diagnosis present

## 2016-03-22 DIAGNOSIS — I4891 Unspecified atrial fibrillation: Secondary | ICD-10-CM | POA: Diagnosis not present

## 2016-03-22 DIAGNOSIS — I10 Essential (primary) hypertension: Secondary | ICD-10-CM | POA: Diagnosis not present

## 2016-03-22 DIAGNOSIS — Z87891 Personal history of nicotine dependence: Secondary | ICD-10-CM | POA: Diagnosis not present

## 2016-03-22 DIAGNOSIS — E039 Hypothyroidism, unspecified: Secondary | ICD-10-CM | POA: Diagnosis not present

## 2016-03-22 DIAGNOSIS — Z7982 Long term (current) use of aspirin: Secondary | ICD-10-CM | POA: Diagnosis not present

## 2016-03-22 LAB — CBC WITH DIFFERENTIAL/PLATELET
Basophils Absolute: 0 10*3/uL (ref 0.0–0.1)
Basophils Relative: 0 %
Eosinophils Absolute: 0.3 10*3/uL (ref 0.0–0.7)
Eosinophils Relative: 2 %
HCT: 43.3 % (ref 36.0–46.0)
Hemoglobin: 14.4 g/dL (ref 12.0–15.0)
Lymphocytes Relative: 36 %
Lymphs Abs: 4.3 10*3/uL — ABNORMAL HIGH (ref 0.7–4.0)
MCH: 29.4 pg (ref 26.0–34.0)
MCHC: 33.3 g/dL (ref 30.0–36.0)
MCV: 88.5 fL (ref 78.0–100.0)
Monocytes Absolute: 1.1 10*3/uL — ABNORMAL HIGH (ref 0.1–1.0)
Monocytes Relative: 9 %
Neutro Abs: 6.3 10*3/uL (ref 1.7–7.7)
Neutrophils Relative %: 53 %
Platelets: 268 10*3/uL (ref 150–400)
RBC: 4.89 MIL/uL (ref 3.87–5.11)
RDW: 14 % (ref 11.5–15.5)
WBC: 11.9 10*3/uL — ABNORMAL HIGH (ref 4.0–10.5)

## 2016-03-22 LAB — BASIC METABOLIC PANEL
Anion gap: 9 (ref 5–15)
BUN: 12 mg/dL (ref 6–20)
CO2: 26 mmol/L (ref 22–32)
Calcium: 9.2 mg/dL (ref 8.9–10.3)
Chloride: 103 mmol/L (ref 101–111)
Creatinine, Ser: 0.78 mg/dL (ref 0.44–1.00)
GFR calc Af Amer: >60 mL/min (ref >60)
GFR calc non Af Amer: >60 mL/min (ref >60)
Glucose, Bld: 115 mg/dL — ABNORMAL HIGH (ref 65–99)
Potassium: 3.8 mmol/L (ref 3.5–5.1)
Sodium: 138 mmol/L (ref 135–145)

## 2016-03-22 LAB — I-STAT TROPONIN, ED: Troponin i, poc: 0 ng/mL (ref 0.00–0.08)

## 2016-03-22 MED ORDER — METOPROLOL TARTRATE 5 MG/5ML IV SOLN: 5.0000 mg | INTRAVENOUS | Status: DC | PRN

## 2016-03-22 NOTE — ED Triage Notes (Signed)
Pt reports having heart palpitations that started around 9pm tonight. Pt measured up to 160 HR. Pt reports this usually happens once or twice a year but recently it has been happening once or twice a month. She states her BP has been going up and down too. Pt also reports throbbing headache on the left side of head and neck for the past two weeks. Pt has been taking tylenol for HA but with minimal relief. Has a hx of Afib. Takes Lopressor and ASA. Pt denies pain.

## 2016-03-22 NOTE — ED Provider Notes (Addendum)
[facility] DEPT Provider Note   CSN: [id number] Arrival date & time: 03/22/16  [id number]  By signing my name below, I, [name], attest that this documentation has been prepared under the direction and in the presence of Jessica Greek, MD . Electronically Signed: Higinio [name], Scribe. 03/22/2016. 3:36 AM.  History   Chief Complaint Chief Complaint  Patient presents with  . Palpitations  . Hypertension   The history is provided by the patient. No language interpreter was used.   HPI Comments: [name] is a 70 y.o. female with PMHx of PAF, who presents to the Emergency Department complaining of sensation of palpitations and dysrhythmia that began ~6 hours PTA.  Pt reports hx of dysrhythmia that she usually experiences "once or twice a year" but notes she has been experiencing it more often recently. She states she takes aspirin and two metoprolol BID but notes she took 3 today. Pt denies chest pain, shortness of breath and use of anticoagulant medication.   Past Medical History:  Diagnosis Date  . Borderline hyperlipidemia   . Borderline hypertension   . Hypothyroid    s/p PTU therapy  . PAF (paroxysmal atrial fibrillation) [facility])     Patient Active Problem List   Diagnosis Date Noted  . Allergic asthma 06/12/2010  . 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 Perennial allergic rhinitis with seasonal variation 05/04/2010  . UNSPECIFIED TACHYCARDIA 12/19/2009  . OBESITY, UNSPECIFIED 09/16/2008  . HYPOTHYROIDISM 09/15/2008  . DYSLIPIDEMIA 09/15/2008  . Essential hypertension 09/15/2008  . ATRIAL FIBRILLATION, PAROXYSMAL 09/15/2008    Past Surgical History:  Procedure Laterality Date  . CESAREAN SECTION    . CHOLECYSTECTOMY    . KNEE SURGERY     right 2014, left 2015, arthroscipic  . LEG SURGERY     Left leg d/t  tumor (benign)  . THYROID SURGERY      OB History    No data available       Home Medications    Prior to Admission  medications   Medication Sig Start Date End Date Taking? Authorizing Provider  aspirin 325 MG tablet Take 325 mg by mouth daily as needed.     Historical Provider, MD  calcium carbonate (TUMS) 500 MG chewable tablet Take 1 to 2 tabs by mouth at bedtime, as needed    Historical Provider, MD  metoprolol tartrate (LOPRESSOR) 25 MG tablet TAKE ONE TABLET BY MOUTH TWICE DAILY 02/09/16   Minus Breeding, MD  omeprazole (PRILOSEC) 10 MG capsule Take 10 mg by mouth daily as needed.    Historical Provider, MD    Family History Family History  Problem Relation Age of Onset  . Lung cancer Father   . Breast cancer Mother   . Cirrhosis Brother   . Diabetes Brother   . Breast cancer Sister     Social History Social History  Substance Use Topics  . Smoking status: Former Smoker    Packs/day: 0.80    Years: 5.00    Types: Cigarettes    Quit date: 04/08/1982  . Smokeless tobacco: Never Used  . Alcohol use No    Allergies   Naproxen sodium; Penicillins; and Promethazine hcl   Review of Systems Review of Systems  Respiratory: Negative for shortness of breath.   Cardiovascular: Positive for palpitations. Negative for chest pain.   Physical Exam Updated Vital Signs BP 135/91 (BP Location: Left Arm)   Pulse 95   Temp 97.7 F (36.5 C) (Oral)  Resp 14   Ht 5' (1.524 m)   Wt 225 lb (102.1 kg)   SpO2 100%   BMI 43.94 kg/m   Physical Exam  Constitutional: She is oriented to person, place, and time. She appears well-developed and well-nourished. No distress.  HENT:  Head: Normocephalic and atraumatic.  Right Ear: Hearing normal.  Left Ear: Hearing normal.  Nose: Nose normal.  Mouth/Throat: Oropharynx is clear and moist and mucous membranes are normal.  Eyes: Conjunctivae and EOM are normal. Pupils are equal, round, and reactive to light.  Neck: Normal range of motion. Neck supple.  Cardiovascular: S1 normal and S2 normal.  An irregularly irregular rhythm present. Tachycardia present.   Exam reveals no gallop and no friction rub.   No murmur heard. Pulmonary/Chest: Effort normal and breath sounds normal. No respiratory distress. She exhibits no tenderness.  Abdominal: Soft. Normal appearance and bowel sounds are normal. There is no hepatosplenomegaly. There is no tenderness. There is no rebound, no guarding, no tenderness at McBurney's point and negative Murphy's sign. No hernia.  Musculoskeletal: Normal range of motion.  Neurological: She is alert and oriented to person, place, and time. She has normal strength. No cranial nerve deficit or sensory deficit. Coordination normal. GCS eye subscore is 4. GCS verbal subscore is 5. GCS motor subscore is 6.  Skin: Skin is warm, dry and intact. No rash noted. No cyanosis.  Psychiatric: She has a normal mood and affect. Her speech is normal and behavior is normal. Thought content normal.  Nursing note and vitals reviewed.   ED Treatments / Results  Labs (all labs ordered are listed, but only abnormal results are displayed) Labs Reviewed  CBC WITH DIFFERENTIAL/PLATELET - Abnormal; Notable for the following:       Result Value   WBC 11.9 (*)    Lymphs Abs 4.3 (*)    Monocytes Absolute 1.1 (*)    All other components within normal limits  BASIC METABOLIC PANEL - Abnormal; Notable for the following:    Glucose, Bld 115 (*)    All other components within normal limits  I-STAT TROPOININ, ED    EKG  EKG Interpretation  Date/Time:  Friday March 22 2016 03:30:28 EST Ventricular Rate:  127 PR Interval:    QRS Duration: 80 QT Interval:  294 QTC Calculation: 428 R Axis:   32 Text Interpretation:  Atrial fibrillation Minimal ST depression, lateral leads No acute changes Confirmed by Kathrynn Humble, MD, Thelma Comp [id number]) on 03/27/2016 11:44:46 AM      Radiology No results found.  Procedures Procedures (including critical care time)  Medications Ordered in ED Medications  metoprolol (LOPRESSOR) injection 5 mg (not administered)     DIAGNOSTIC STUDIES:      COORDINATION OF CARE:  4:49 AM Discussed treatment [name] with pt at bedside and pt agreed to [name].  Initial Impression / Assessment and [name] / ED Course  I have reviewed the triage vital signs and the nursing notes.  Pertinent labs & imaging results that were available during my care of the patient were reviewed by me and considered in my medical decision making (see chart for details).  Clinical Course   Patient presents to the ER for evaluation of palpitations. Patient has history of paroxysmal atrial fibrillation. She reports that in the last few months she has been having frequent episodes of palpitations. Either spontaneously resolves or gets better after she takes an extra dose of Lopressor. Symptoms have been present for at least 3 hours tonight and have persisted  despite giving herself an extra dose of Lopressor. No chest pain associated with the symptoms.  EKG does not show evidence of ischemia. Troponin negative. Vital signs are stable other than tachycardia. Patient administered IV Lopressor and has converted to sinus rhythm.  Patient's CHA2DS2/VAS is 2. I discussed with her the risks of stroke. She has previously declined anticoagulation. She once again declines tonight. I counseled her that she needs to follow-up with her cardiologist to further discuss this issue again.  CHA2DS2/VAS Stroke Risk Points      2 >= 2 Points: High Risk  1 - 1.99 Points: Medium Risk  0 Points: Low Risk    The previous score was 1 on 11/01/2015.:  Change:         Details    Note: External data might be a factor in metrics not marked with    Points Metrics   This score determines the patient's risk of having a stroke if the  patient has atrial fibrillation.       0 Has Congestive Heart Failure:  No   0 Has Vascular Disease:  No   1 Has Hypertension:  Yes   0 Age:  26   0 Has Diabetes:  No   0 Had Stroke:  No Had TIA:  No Had thromboembolism:  No   1 Female:   Yes    Final Clinical Impressions(s) / ED Diagnoses   Final diagnoses:  Atrial fibrillation with RVR ([place])    New Prescriptions New Prescriptions   No medications on file   I personally performed the services described in this documentation, which was scribed in my presence. The recorded information has been reviewed and is accurate.    Jessica Greek, MD 03/22/16 [place], MD 05/07/16 [phone]

## 2016-04-02 NOTE — Progress Notes (Signed)
HPI The patient presents for followup of atrial fibrillation.  She was in the ED on 12/15 with palpitations.  I have reviewed these records.    On 12:15 lasted for 6-7 hours. She presented to the emergency room and was treated with IV Lopressor converted to sinus rhythm. On 1225 lasted for about 3 hours. Prior to this she had about 3 episodes per year. She says she feels her heart racing. She did try take an extra dose of beta blocker during one of those events. In the emergency room there were no other abnormalities. She's not having any chest pressure, neck or arm discomfort. She's not having any shortness of breath, PND or orthopnea. She has had some headache in the left posterior part of her head occasionally has been going on for some time and doesn't seem to be associated. She's had some episodic high blood pressures that she seems to notice with the palpitations.. She's being active taking care of a 37-year-old grandson. Her daughter is now expecting twins.   Allergies  Allergen Reactions  . Naproxen Sodium     Hurt all over  . Penicillins     Hives   . Promethazine Hcl     Sick     Current Outpatient Prescriptions  Medication Sig Dispense Refill  . calcium carbonate (TUMS) 500 MG chewable tablet Take 1 to 2 tabs by mouth at bedtime, as needed    . metoprolol tartrate (LOPRESSOR) 50 MG tablet TAKE ONE TABLET BY MOUTH TWICE DAILY 60 tablet 11  . omeprazole (PRILOSEC) 10 MG capsule Take 10 mg by mouth daily as needed.    Marland Kitchen apixaban (ELIQUIS) 5 MG TABS tablet Take 1 tablet (5 mg total) by mouth 2 (two) times daily. 60 tablet 11   No current facility-administered medications for this visit.     Past Medical History:  Diagnosis Date  . Borderline hyperlipidemia   . Borderline hypertension   . Hypothyroid    s/p PTU therapy  . PAF (paroxysmal atrial fibrillation) ([place])     Past Surgical History:  Procedure Laterality Date  . CESAREAN SECTION    . CHOLECYSTECTOMY    . KNEE  SURGERY     right 2014, left 2015, arthroscipic  . LEG SURGERY     Left leg d/t  tumor (benign)  . THYROID SURGERY      ROS:   As stated in the HPI and negative for all other systems.  PHYSICAL EXAM BP (!) 142/78   Pulse 67   Ht 5\' 2"  (1.575 m)   Wt 228 lb (103.4 kg)   BMI 41.70 kg/m  GENERAL:  Well appearing NECK:  No jugular venous distention, waveform within normal limits, carotid upstroke brisk and symmetric, no bruits, no thyromegaly LUNGS:  Clear to auscultation bilaterally CHEST:  Unremarkable HEART:  PMI not displaced or sustained,S1 and S2 within normal limits, no S3, no S4, no clicks, no rubs, no murmurs ABD:  Flat, positive bowel sounds normal in frequency in pitch, no bruits, no rebound, no guarding, no midline pulsatile mass, no hepatomegaly, no splenomegaly EXT:  2 plus pulses throughout, no edema, no cyanosis no clubbing  EKG:  03/22/16   Atrial fibrillation, rate 127, no acute ST-T wave changes.  ASSESSMENT AND PLAN  ATRIAL FIBRILLATION, PAROXYSMAL  She has rare paroxysms and for now she wants conservative therapy without antiarrhythmic therapy.  Ms. COURTENEY INTERRANTE has a CHA2DS2 - VASc score of 2 with a risk of stroke of 2.2%.  However, she has not wanted to take anticoagulation.  We talked about this risk.   Today she agrees to start Eliquis and to stop aspirin. When she returns she'll need a TSH as I see now that she did not have these drawn in the emergency room.   I am going to increase her metoprolol to 50 mg twice daily.  HYPERTENSION The blood pressure is mildly elevated at times.   This will be addressed with the beta blocker change as above.

## 2016-04-03 ENCOUNTER — Encounter: Payer: Self-pay | Admitting: Cardiology

## 2016-04-03 ENCOUNTER — Ambulatory Visit (INDEPENDENT_AMBULATORY_CARE_PROVIDER_SITE_OTHER): Payer: Medicare Other | Admitting: Cardiology

## 2016-04-03 VITALS — BP 142/78 | HR 67 | Ht 62.0 in | Wt 228.0 lb

## 2016-04-03 DIAGNOSIS — I10 Essential (primary) hypertension: Secondary | ICD-10-CM | POA: Diagnosis not present

## 2016-04-03 DIAGNOSIS — I480 Paroxysmal atrial fibrillation: Secondary | ICD-10-CM | POA: Diagnosis not present

## 2016-04-03 MED ORDER — METOPROLOL TARTRATE 50 MG PO TABS: ORAL_TABLET | ORAL | 11 refills | Status: DC

## 2016-04-03 MED ORDER — APIXABAN 5 MG PO TABS: 5.0000 mg | ORAL_TABLET | Freq: Two times a day (BID) | ORAL | 11 refills | Status: DC

## 2016-04-03 NOTE — Patient Instructions (Signed)
Medication Instructions:  STOP- Aspirin START- Eliquis 5 mg twice a day INCREASE- Metoprolol 50 mg twice a day  Labwork: None Ordered  Testing/Procedures: Your physician has requested that you have an echocardiogram. Echocardiography is a painless test that uses sound waves to create images of your heart. It provides your doctor with information about the size and shape of your heart and how well your heart's chambers and valves are working. This procedure takes approximately one hour. There are no restrictions for this procedure.  Follow-Up: Your physician recommends that you schedule a follow-up appointment in: 1 Month   Any Other Special Instructions Will Be Listed Below (If Applicable).       HAPPY NEW YEARS   If you need a refill on your cardiac medications before your next appointment, please call your pharmacy.

## 2016-04-18 ENCOUNTER — Other Ambulatory Visit: Payer: Self-pay

## 2016-04-18 ENCOUNTER — Ambulatory Visit (HOSPITAL_COMMUNITY): Payer: Medicare Other | Attending: Cardiovascular Disease

## 2016-04-18 DIAGNOSIS — I480 Paroxysmal atrial fibrillation: Secondary | ICD-10-CM | POA: Diagnosis present

## 2016-04-18 DIAGNOSIS — I10 Essential (primary) hypertension: Secondary | ICD-10-CM | POA: Diagnosis not present

## 2016-04-18 DIAGNOSIS — Z87891 Personal history of nicotine dependence: Secondary | ICD-10-CM | POA: Diagnosis not present

## 2016-04-18 DIAGNOSIS — E785 Hyperlipidemia, unspecified: Secondary | ICD-10-CM | POA: Insufficient documentation

## 2016-05-07 ENCOUNTER — Other Ambulatory Visit: Payer: Self-pay | Admitting: Family Medicine

## 2016-05-07 DIAGNOSIS — Z1231 Encounter for screening mammogram for malignant neoplasm of breast: Secondary | ICD-10-CM

## 2016-05-10 ENCOUNTER — Telehealth: Payer: Self-pay | Admitting: Cardiology

## 2016-05-10 NOTE — Telephone Encounter (Signed)
Pt of Dr. Percival Spanish Hx A Fib She notes at time of call she's "back in rhythm", but she keeps going in and out of rhythm. Off and on since midnight betw 50-150, the higher range especially noted when moving around.  VS readings: early AM: 149/101 HR 58 9:00am 137/68 HR 67  Denies any sob, fatigue, etc - worried the HR keeps bouncing around, but it's doing better now. Her typical resting HR is 60-62.  She's on 50mg  BID metoprolol. when she was taking 25mg  BID and had these kind of episodes, she was advised to take extra metoprolol to resolve. Informed her I would check and see if OK to do so today & how much - due to concern that her resting HR is already low.  Sent to [facility] to advise.

## 2016-05-10 NOTE — Telephone Encounter (Signed)
Ok to take additional 25 mg metoprolol with HR > 120. Kirk Ruths

## 2016-05-10 NOTE — Telephone Encounter (Signed)
Patient states she was up the majority of the night with her heart racing.  Please call.

## 2016-05-10 NOTE — Telephone Encounter (Signed)
Pt advised on provider instructions, voiced understanding. She'll take extra medication if needed and continue to monitor. I've advised her to call again if HR fails to improve, or if she has new concerns or symptoms.

## 2016-05-11 NOTE — Progress Notes (Signed)
  HPI The patient presents for followup of atrial fibrillation.  She was in the ED on 03/22/16 with palpitations.    She presented to the emergency room and was treated with IV Lopressor converted to sinus rhythm.   She has had several episodes in the past year.    At the last visit she agreed to take anticoagulation and had beta blockers for rate control.   However, she could not afford the Eliquis and has not been taking this.  Since I saw her she had another episode, lasting 8 hours, of atrial fib. She had some left upper chest pain with left arm pain associated with this.   She's being active taking care of a 31-year-old grandson. Her daughter is now expecting twins.   Allergies  Allergen Reactions  . Naproxen Sodium     Hurt all over  . Penicillins     Hives   . Promethazine Hcl     Sick     Current Outpatient Prescriptions  Medication Sig Dispense Refill  . calcium carbonate (TUMS) 500 MG chewable tablet Take 1 to 2 tabs by mouth at bedtime, as needed    . metoprolol tartrate (LOPRESSOR) 50 MG tablet TAKE ONE TABLET BY MOUTH TWICE DAILY 60 tablet 11  . omeprazole (PRILOSEC) 10 MG capsule Take 10 mg by mouth daily as needed.     No current facility-administered medications for this visit.     Past Medical History:  Diagnosis Date  . Borderline hyperlipidemia   . Borderline hypertension   . Hypothyroid    s/p PTU therapy  . PAF (paroxysmal atrial fibrillation) ([place])     Past Surgical History:  Procedure Laterality Date  . CESAREAN SECTION    . CHOLECYSTECTOMY    . KNEE SURGERY     right 2014, left 2015, arthroscipic  . LEG SURGERY     Left leg d/t  tumor (benign)  . THYROID SURGERY      ROS:    As stated in the HPI and negative for all other systems.  PHYSICAL EXAM BP (!) 154/78   Pulse (!) 56   Ht 5' (1.524 m)   Wt 228 lb (103.4 kg)   BMI 44.53 kg/m  GENERAL:  Well appearing NECK:  No jugular venous distention, waveform within normal limits, carotid upstroke  brisk and symmetric, no bruits, no thyromegaly LUNGS:  Clear to auscultation bilaterally CHEST:  Unremarkable HEART:  PMI not displaced or sustained,S1 and S2 within normal limits, no S3, no S4, no clicks, no rubs, no murmurs ABD:  Flat, positive bowel sounds normal in frequency in pitch, no bruits, no rebound, no guarding, no midline pulsatile mass, no hepatomegaly, no splenomegaly EXT:  2 plus pulses throughout, no edema, no cyanosis no clubbing   ASSESSMENT AND PLAN  ATRIAL FIBRILLATION, PAROXYSMAL  She has rare paroxysms and for now she wants conservative therapy without antiarrhythmic therapy.  [name] has a CHA2DS2 - VASc score of 2 with a risk of stroke of 2.2%.  At the last visit she agreed to take anticoagulation. I increased beta blocker for rate control.  Because she's not having recurrent episodes and going to continue flecainide. However, she needs to have a stress test first. She would not be a walker treadmill. Therefore, she will have a [employer].  I will also start her on warfarin.  HYPERTENSION The blood pressure is slightly elevated but this is unusual. No change in therapy is indicated.

## 2016-05-13 ENCOUNTER — Encounter: Payer: Self-pay | Admitting: Cardiology

## 2016-05-13 ENCOUNTER — Ambulatory Visit (INDEPENDENT_AMBULATORY_CARE_PROVIDER_SITE_OTHER): Payer: Medicare Other | Admitting: Cardiology

## 2016-05-13 VITALS — BP 154/78 | HR 56 | Ht 60.0 in | Wt 228.0 lb

## 2016-05-13 DIAGNOSIS — I480 Paroxysmal atrial fibrillation: Secondary | ICD-10-CM

## 2016-05-13 DIAGNOSIS — R072 Precordial pain: Secondary | ICD-10-CM

## 2016-05-13 NOTE — Patient Instructions (Signed)
Medication Instructions:  Continue current medications  Labwork: None Ordered  Testing/Procedures: Your physician has requested that you have a lexiscan myoview. For further information please visit [email or web address]. Please follow instruction sheet, as given.   Follow-Up: Your physician recommends that you schedule a follow-up appointment in: 1 Month  Your physician recommends that you schedule a follow-up appointment with Kristin/Rachel to start Coumadin therapy   Any Other Special Instructions Will Be Listed Below (If Applicable).   If you need a refill on your cardiac medications before your next appointment, please call your pharmacy.

## 2016-05-14 ENCOUNTER — Ambulatory Visit (INDEPENDENT_AMBULATORY_CARE_PROVIDER_SITE_OTHER): Payer: Medicare Other | Admitting: Pharmacist

## 2016-05-14 DIAGNOSIS — I480 Paroxysmal atrial fibrillation: Secondary | ICD-10-CM | POA: Diagnosis not present

## 2016-05-14 LAB — POCT INR: INR: 1

## 2016-05-14 MED ORDER — WARFARIN SODIUM 5 MG PO TABS: ORAL_TABLET | ORAL | 3 refills | Status: DC

## 2016-05-14 NOTE — Patient Instructions (Signed)
Vitamin K Foods and Warfarin Warfarin is a blood thinner (anticoagulant). Anticoagulant medicines help prevent the formation of blood clots. These medicines work by decreasing the activity of vitamin K, which promotes normal blood clotting. When you take warfarin, problems can occur from suddenly increasing or decreasing the amount of vitamin K that you eat from one day to the next. Problems may include:  Blood clots.  Bleeding. What general guidelines do I need to follow? To avoid problems when taking warfarin:  Eat a balanced diet that includes:  Fresh fruits and vegetables.  Whole grains.  Low-fat dairy products.  Lean proteins, such as fish, eggs, and lean cuts of meat.  Keep your intake of vitamin K consistent from day to day. To do this:  Avoid eating large amounts of vitamin K one day and low amounts of vitamin K the next day.  If you take a multivitamin that contains vitamin K, be sure to take it every day.  Know which foods contain vitamin K. Use the lists below to understand serving sizes and the amount of vitamin K in one serving.  Avoid major changes in your diet. If you are going to change your diet, talk with your health care provider before making changes.  Work with a nutrition specialist (dietitian) to develop a meal plan that works best for you. High vitamin K foods Foods that are high in vitamin K contain more than 100 mcg (micrograms) per serving. These include:  Broccoli (cooked) -  cup has 110 mcg.  Brussels sprouts (cooked) -  cup has 109 mcg.  Greens, beet (cooked) -  cup has 350 mcg.  Greens, collard (cooked) -  cup has 418 mcg.  Greens, turnip (cooked) -  cup has 265 mcg.  Green onions or scallions -  cup has 105 mcg.  Kale (fresh or frozen) -  cup has 531 mcg.  Parsley (raw) - 10 sprigs has 164 mcg.  Spinach (cooked) -  cup has 444 mcg.  Swiss chard (cooked) -  cup has 287 mcg. Moderate vitamin K foods Foods that have a  moderate amount of vitamin K contain 25-100 mcg per serving. These include:  Asparagus (cooked) - 5 spears have 38 mcg.  Black-eyed peas (dried) -  cup has 32 mcg.  Cabbage (cooked) -  cup has 37 mcg.  Kiwi fruit - 1 medium has 31 mcg.  Lettuce - 1 cup has 57-63 mcg.  Okra (frozen) -  cup has 44 mcg.  Prunes (dried) - 5 prunes have 25 mcg.  Watercress (raw) - 1 cup has 85 mcg. Low vitamin K foods Foods low in vitamin K contain less than 25 mcg per serving. These include:  Artichoke - 1 medium has 18 mcg.  Avocado - 1 oz. has 6 mcg.  Blueberries -  cup has 14 mcg.  Cabbage (raw) -  cup has 21 mcg.  Carrots (cooked) -  cup has 11 mcg.  Cauliflower (raw) -  cup has 11 mcg.  Cucumber with peel (raw) -  cup has 9 mcg.  Grapes -  cup has 12 mcg.  Mango - 1 medium has 9 mcg.  Nuts - 1 oz. has 15 mcg.  Pear - 1 medium has 8 mcg.  Peas (cooked) -  cup has 19 mcg.  Pickles - 1 spear has 14 mcg.  Pumpkin seeds - 1 oz. has 13 mcg.  Sauerkraut (canned) -  cup has 16 mcg.  Soybeans (cooked) -  cup has 16 mcg.    Tomato (raw) - 1 medium has 10 mcg.  Tomato sauce -  cup has 17 mcg. Vitamin K-free foods If a food contain less than 5 mcg per serving, it is considered to have no vitamin K. These foods include:  Bread and cereal products.  Cheese.  Eggs.  Fish and shellfish.  Meat and poultry.  Milk and dairy products.  Sunflower seeds. Actual amounts of vitamin K in foods may be different depending on processing. Talk with your dietitian about what foods you can eat and what foods you should avoid. This information is not intended to replace advice given to you by your health care provider. Make sure you discuss any questions you have with your health care provider. Document Released: 01/20/2009 Document Revised: 10/15/2015 Document Reviewed: 06/28/2015 Elsevier Interactive Patient Education  2017 Elsevier Inc.  

## 2016-05-16 ENCOUNTER — Telehealth (HOSPITAL_COMMUNITY): Payer: Self-pay

## 2016-05-16 NOTE — Telephone Encounter (Signed)
Encounter complete. 

## 2016-05-17 ENCOUNTER — Ambulatory Visit (INDEPENDENT_AMBULATORY_CARE_PROVIDER_SITE_OTHER): Payer: Medicare Other | Admitting: Pharmacist

## 2016-05-17 DIAGNOSIS — I480 Paroxysmal atrial fibrillation: Secondary | ICD-10-CM

## 2016-05-17 LAB — POCT INR: INR: 1.2

## 2016-05-21 ENCOUNTER — Ambulatory Visit (HOSPITAL_COMMUNITY)
Admission: RE | Admit: 2016-05-21 | Discharge: 2016-05-21 | Disposition: A | Discharge status: Home / Self Care | Payer: Medicare Other | Source: Ambulatory Visit | Attending: Cardiovascular Disease | Admitting: Cardiovascular Disease

## 2016-05-21 DIAGNOSIS — I480 Paroxysmal atrial fibrillation: Secondary | ICD-10-CM | POA: Diagnosis not present

## 2016-05-21 MED ORDER — TECHNETIUM TC 99M TETROFOSMIN IV KIT
32.6000 | PACK | Freq: Once | INTRAVENOUS | Status: AC | PRN
  Administered 2016-05-21: 32.6 via INTRAVENOUS
  Filled 2016-05-21: qty 33

## 2016-05-21 MED ORDER — REGADENOSON 0.4 MG/5ML IV SOLN
0.4000 mg | Freq: Once | INTRAVENOUS | Status: AC
  Administered 2016-05-21: 0.4 mg via INTRAVENOUS

## 2016-05-22 ENCOUNTER — Ambulatory Visit (INDEPENDENT_AMBULATORY_CARE_PROVIDER_SITE_OTHER): Payer: Medicare Other | Admitting: Pharmacist

## 2016-05-22 ENCOUNTER — Ambulatory Visit (HOSPITAL_COMMUNITY)
Admission: RE | Admit: 2016-05-22 | Discharge: 2016-05-22 | Disposition: A | Discharge status: Home / Self Care | Payer: Medicare Other | Source: Ambulatory Visit | Attending: Diagnostic Radiology | Admitting: Diagnostic Radiology

## 2016-05-22 DIAGNOSIS — I480 Paroxysmal atrial fibrillation: Secondary | ICD-10-CM

## 2016-05-22 LAB — MYOCARDIAL PERFUSION IMAGING
LV dias vol: 92 mL (ref 46–106)
LV sys vol: 35 mL
Peak HR: 86 {beats}/min
Rest HR: 49 {beats}/min
SDS: 4
SRS: 1
SSS: 5
TID: 1.08

## 2016-05-22 LAB — POCT INR: INR: 2.4

## 2016-05-22 MED ORDER — TECHNETIUM TC 99M TETROFOSMIN IV KIT
30.7000 | PACK | Freq: Once | INTRAVENOUS | Status: AC | PRN
  Administered 2016-05-22: 30.7 via INTRAVENOUS

## 2016-05-29 ENCOUNTER — Ambulatory Visit (INDEPENDENT_AMBULATORY_CARE_PROVIDER_SITE_OTHER): Payer: Medicare Other | Admitting: Pharmacist Clinician (PhC)/ Clinical Pharmacy Specialist

## 2016-05-29 DIAGNOSIS — I480 Paroxysmal atrial fibrillation: Secondary | ICD-10-CM | POA: Diagnosis not present

## 2016-05-29 LAB — POCT INR: INR: 4.9

## 2016-06-05 ENCOUNTER — Ambulatory Visit (INDEPENDENT_AMBULATORY_CARE_PROVIDER_SITE_OTHER): Payer: Medicare Other | Admitting: Pharmacist

## 2016-06-05 DIAGNOSIS — I480 Paroxysmal atrial fibrillation: Secondary | ICD-10-CM | POA: Diagnosis not present

## 2016-06-05 LAB — POCT INR: INR: 2.5

## 2016-06-10 ENCOUNTER — Ambulatory Visit
Admission: RE | Admit: 2016-06-10 | Discharge: 2016-06-10 | Disposition: A | Discharge status: Home / Self Care | Payer: Medicare Other | Source: Ambulatory Visit | Attending: Family Medicine | Admitting: Family Medicine

## 2016-06-10 DIAGNOSIS — Z1231 Encounter for screening mammogram for malignant neoplasm of breast: Secondary | ICD-10-CM

## 2016-06-14 ENCOUNTER — Ambulatory Visit (INDEPENDENT_AMBULATORY_CARE_PROVIDER_SITE_OTHER): Payer: Medicare Other | Admitting: Pharmacist Clinician (PhC)/ Clinical Pharmacy Specialist

## 2016-06-14 DIAGNOSIS — I480 Paroxysmal atrial fibrillation: Secondary | ICD-10-CM | POA: Diagnosis not present

## 2016-06-14 LAB — POCT INR: INR: 3.7

## 2016-06-18 NOTE — Progress Notes (Signed)
    HPI The patient presents for followup of atrial fibrillation.  She was in the ED on 03/22/16 with palpitations.  Since that time she had a [employer].  This was negative for ischemia.  She has been doing well.  She denies any palpitations.  She is now taking care of her two year old and new twins.  She has had no new events.     Allergies  Allergen Reactions  . Naproxen Sodium     Hurt all over  . Penicillins     Hives   . Promethazine Hcl     Sick     Current Outpatient Prescriptions  Medication Sig Dispense Refill  . calcium carbonate (TUMS) 500 MG chewable tablet Take 1 to 2 tabs by mouth at bedtime, as needed    . metoprolol tartrate (LOPRESSOR) 50 MG tablet TAKE ONE TABLET BY MOUTH TWICE DAILY 60 tablet 11  . omeprazole (PRILOSEC) 10 MG capsule Take 10 mg by mouth daily as needed.    . warfarin (COUMADIN) 5 MG tablet Take 1 tablet (5 mg) every evening OR as directed by coumadin clinic 30 tablet 3   No current facility-administered medications for this visit.     Past Medical History:  Diagnosis Date  . Borderline hyperlipidemia   . Borderline hypertension   . Hypothyroid    s/p PTU therapy  . PAF (paroxysmal atrial fibrillation) ([place])     Past Surgical History:  Procedure Laterality Date  . CESAREAN SECTION    . CHOLECYSTECTOMY    . KNEE SURGERY     right 2014, left 2015, arthroscipic  . LEG SURGERY     Left leg d/t  tumor (benign)  . THYROID SURGERY      ROS:     As stated in the HPI and negative for all other systems.  PHYSICAL EXAM BP (!) 148/70 (BP Location: Right Arm, Patient Position: Sitting, Cuff Size: Large)   Pulse (!) 57   Ht 5' (1.524 m)   Wt 228 lb (103.4 kg)   BMI 44.53 kg/m  GENERAL:  Well appearing NECK:  No jugular venous distention, waveform within normal limits, carotid upstroke brisk and symmetric, no bruits, no thyromegaly LUNGS:  Clear to auscultation bilaterally CHEST:  Unremarkable HEART:  PMI not displaced or  sustained,S1 and S2 within normal limits, no S3, no S4, no clicks, no rubs, no murmurs ABD:  Flat, positive bowel sounds normal in frequency in pitch, no bruits, no rebound, no guarding, no midline pulsatile mass, no hepatomegaly, no splenomegaly EXT:  2 plus pulses throughout, no edema, no cyanosis no clubbing  EKG:  NA  ASSESSMENT AND PLAN  ATRIAL FIBRILLATION, PAROXYSMAL    She has rare paroxysms and for now she wants conservative therapy without antiarrhythmic therapy.  [name] has a CHA2DS2 - VASc score of 2 with a risk of stroke of 2.2%.  She is doing well on rate control and anticoagulation.    HYPERTENSION The blood pressure is slightly elevated but this is unusual. No change in therapy is indicated.

## 2016-06-19 ENCOUNTER — Ambulatory Visit (INDEPENDENT_AMBULATORY_CARE_PROVIDER_SITE_OTHER): Payer: Medicare Other | Admitting: Pharmacist Clinician (PhC)/ Clinical Pharmacy Specialist

## 2016-06-19 ENCOUNTER — Encounter: Payer: Self-pay | Admitting: Cardiology

## 2016-06-19 ENCOUNTER — Ambulatory Visit (INDEPENDENT_AMBULATORY_CARE_PROVIDER_SITE_OTHER): Payer: Medicare Other | Admitting: Cardiology

## 2016-06-19 VITALS — BP 148/70 | HR 57 | Ht 60.0 in | Wt 228.0 lb

## 2016-06-19 DIAGNOSIS — I480 Paroxysmal atrial fibrillation: Secondary | ICD-10-CM

## 2016-06-19 LAB — POCT INR: INR: 3

## 2016-06-19 NOTE — Patient Instructions (Signed)

## 2016-07-05 ENCOUNTER — Ambulatory Visit (INDEPENDENT_AMBULATORY_CARE_PROVIDER_SITE_OTHER): Payer: Medicare Other | Admitting: Pharmacist Clinician (PhC)/ Clinical Pharmacy Specialist

## 2016-07-05 DIAGNOSIS — I480 Paroxysmal atrial fibrillation: Secondary | ICD-10-CM | POA: Diagnosis not present

## 2016-07-05 LAB — POCT INR: INR: 3.2

## 2016-07-19 ENCOUNTER — Ambulatory Visit (INDEPENDENT_AMBULATORY_CARE_PROVIDER_SITE_OTHER): Payer: Medicare Other | Admitting: Pharmacist Clinician (PhC)/ Clinical Pharmacy Specialist

## 2016-07-19 DIAGNOSIS — I480 Paroxysmal atrial fibrillation: Secondary | ICD-10-CM

## 2016-07-19 LAB — POCT INR: INR: 2.2

## 2016-08-05 ENCOUNTER — Ambulatory Visit (INDEPENDENT_AMBULATORY_CARE_PROVIDER_SITE_OTHER): Payer: Medicare Other | Admitting: Pharmacist

## 2016-08-05 DIAGNOSIS — I480 Paroxysmal atrial fibrillation: Secondary | ICD-10-CM | POA: Diagnosis not present

## 2016-08-05 LAB — POCT INR: INR: 2.2

## 2016-08-26 ENCOUNTER — Ambulatory Visit (INDEPENDENT_AMBULATORY_CARE_PROVIDER_SITE_OTHER): Payer: Medicare Other | Admitting: Pharmacist

## 2016-08-26 DIAGNOSIS — I480 Paroxysmal atrial fibrillation: Secondary | ICD-10-CM | POA: Diagnosis not present

## 2016-08-26 LAB — POCT INR: INR: 2.3

## 2016-09-09 ENCOUNTER — Other Ambulatory Visit: Payer: Self-pay | Admitting: Cardiology

## 2016-09-25 ENCOUNTER — Ambulatory Visit (INDEPENDENT_AMBULATORY_CARE_PROVIDER_SITE_OTHER): Payer: Medicare Other | Admitting: Pharmacist

## 2016-09-25 DIAGNOSIS — I480 Paroxysmal atrial fibrillation: Secondary | ICD-10-CM

## 2016-09-25 LAB — POCT INR: INR: 2

## 2016-10-23 ENCOUNTER — Telehealth: Payer: Self-pay | Admitting: *Deleted

## 2016-10-23 ENCOUNTER — Ambulatory Visit (INDEPENDENT_AMBULATORY_CARE_PROVIDER_SITE_OTHER): Payer: Medicare Other | Admitting: Pharmacist

## 2016-10-23 DIAGNOSIS — I480 Paroxysmal atrial fibrillation: Secondary | ICD-10-CM | POA: Diagnosis not present

## 2016-10-23 LAB — POCT INR: INR: 2.7

## 2016-10-23 NOTE — Telephone Encounter (Signed)
Requesting surgical clearance:   1. Type of surgery: Cataract extraction with intraocular lens implantation of the left eye followed by the right eye.  2. Surgeon: Dr Darleen Crocker  3. Surgical date: 11/11/2016  4. Medications that need to be help: warfarin  5. [place] and [place]: [phone]  But warfarin does not need to be held. Is pt cleared for surgery?

## 2016-10-30 NOTE — Telephone Encounter (Signed)
OK to hold warfarin 3 days prior to surgery.

## 2016-10-31 NOTE — Telephone Encounter (Signed)
Clearance for surgery faxed to [facility]

## 2016-11-26 ENCOUNTER — Emergency Department (HOSPITAL_COMMUNITY): Payer: Medicare Other

## 2016-11-26 ENCOUNTER — Other Ambulatory Visit: Payer: Self-pay

## 2016-11-26 ENCOUNTER — Emergency Department (HOSPITAL_COMMUNITY)
Admission: EM | Admit: 2016-11-26 | Discharge: 2016-11-26 | Disposition: A | Discharge status: Home / Self Care | Payer: Medicare Other | Attending: Emergency Medicine | Admitting: Emergency Medicine

## 2016-11-26 ENCOUNTER — Telehealth: Payer: Self-pay | Admitting: Cardiology

## 2016-11-26 ENCOUNTER — Encounter (HOSPITAL_COMMUNITY): Payer: Self-pay | Admitting: Emergency Medicine

## 2016-11-26 DIAGNOSIS — E039 Hypothyroidism, unspecified: Secondary | ICD-10-CM | POA: Insufficient documentation

## 2016-11-26 DIAGNOSIS — Z87891 Personal history of nicotine dependence: Secondary | ICD-10-CM | POA: Diagnosis not present

## 2016-11-26 DIAGNOSIS — Z7901 Long term (current) use of anticoagulants: Secondary | ICD-10-CM | POA: Diagnosis not present

## 2016-11-26 DIAGNOSIS — J45909 Unspecified asthma, uncomplicated: Secondary | ICD-10-CM | POA: Insufficient documentation

## 2016-11-26 DIAGNOSIS — I10 Essential (primary) hypertension: Secondary | ICD-10-CM | POA: Diagnosis not present

## 2016-11-26 DIAGNOSIS — R002 Palpitations: Secondary | ICD-10-CM | POA: Diagnosis present

## 2016-11-26 DIAGNOSIS — I4891 Unspecified atrial fibrillation: Secondary | ICD-10-CM | POA: Diagnosis not present

## 2016-11-26 DIAGNOSIS — Z79899 Other long term (current) drug therapy: Secondary | ICD-10-CM | POA: Diagnosis not present

## 2016-11-26 LAB — CBC
HCT: 40.3 % (ref 36.0–46.0)
Hemoglobin: 13 g/dL (ref 12.0–15.0)
MCH: 28.6 pg (ref 26.0–34.0)
MCHC: 32.3 g/dL (ref 30.0–36.0)
MCV: 88.8 fL (ref 78.0–100.0)
Platelets: 266 10*3/uL (ref 150–400)
RBC: 4.54 MIL/uL (ref 3.87–5.11)
RDW: 14.3 % (ref 11.5–15.5)
WBC: 10.2 10*3/uL (ref 4.0–10.5)

## 2016-11-26 LAB — I-STAT TROPONIN, ED: Troponin i, poc: 0 ng/mL (ref 0.00–0.08)

## 2016-11-26 LAB — HEPATIC FUNCTION PANEL
ALT: 13 U/L — ABNORMAL LOW (ref 14–54)
AST: 22 U/L (ref 15–41)
Albumin: 3.4 g/dL — ABNORMAL LOW (ref 3.5–5.0)
Alkaline Phosphatase: 76 U/L (ref 38–126)
Bilirubin, Direct: <0.1 mg/dL — ABNORMAL LOW (ref 0.1–0.5)
Total Bilirubin: 0.5 mg/dL (ref 0.3–1.2)
Total Protein: 7.3 g/dL (ref 6.5–8.1)

## 2016-11-26 LAB — BASIC METABOLIC PANEL
Anion gap: 6 (ref 5–15)
BUN: 14 mg/dL (ref 6–20)
CO2: 28 mmol/L (ref 22–32)
Calcium: 8.9 mg/dL (ref 8.9–10.3)
Chloride: 105 mmol/L (ref 101–111)
Creatinine, Ser: 0.78 mg/dL (ref 0.44–1.00)
GFR calc Af Amer: >60 mL/min (ref >60)
GFR calc non Af Amer: >60 mL/min (ref >60)
Glucose, Bld: 119 mg/dL — ABNORMAL HIGH (ref 65–99)
Potassium: 4 mmol/L (ref 3.5–5.1)
Sodium: 139 mmol/L (ref 135–145)

## 2016-11-26 LAB — PROTIME-INR
INR: 1.8
Prothrombin Time: 21.1 seconds — ABNORMAL HIGH (ref 11.4–15.2)

## 2016-11-26 LAB — LIPASE, BLOOD: Lipase: 56 U/L — ABNORMAL HIGH (ref 11–51)

## 2016-11-26 LAB — T4, FREE: Free T4: 1.05 ng/dL (ref 0.61–1.12)

## 2016-11-26 LAB — TSH: TSH: 2.353 u[IU]/mL (ref 0.350–4.500)

## 2016-11-26 MED ORDER — DILTIAZEM HCL 30 MG PO TABS: 30.0000 mg | ORAL_TABLET | Freq: Two times a day (BID) | ORAL | 0 refills | Status: DC | PRN

## 2016-11-26 NOTE — ED Notes (Signed)
Spoke w/ main lab who stated they could add on some of the labs to previous lab work. Will collect gold top for T4.

## 2016-11-26 NOTE — Telephone Encounter (Signed)
Returned call to patient.She stated her heart is racing.Stated she has been having chest pain off and on.No chest pain at present,but heart is racing.Advised to go to [facility] ED.Wannetta Sender was notified.

## 2016-11-26 NOTE — ED Notes (Signed)
Pt came to front desk and asked about wait time. This RN informed pt that she has a room and that her name will be called shortly. Pt is going to stay.

## 2016-11-26 NOTE — Telephone Encounter (Signed)
New message    Patient c/o Palpitations:  High priority if patient c/o lightheadedness and shortness of breath.  1. How long have you been having palpitations? Since 3 am  2. Are you currently experiencing lightheadedness and shortness of breath? No   3. Have you checked your BP and heart rate? (document readings) 145/97 p-113  4. Are you experiencing any other symptoms? Headaches, sharp chest pains that come and go

## 2016-11-26 NOTE — ED Triage Notes (Signed)
Pt here for palpitations with hx of Afib starting this am; pt sts some CP

## 2016-11-26 NOTE — Telephone Encounter (Signed)
Agree 

## 2016-11-26 NOTE — ED Provider Notes (Addendum)
[facility] DEPT Provider Note   CSN: [id number] Arrival date & time: 11/26/16  1340     History   Chief Complaint Chief Complaint  Patient presents with  . Palpitations    HPI [name] is a 71 y.o. female.  HPI Patient history of paroxysmal fibrillation. She is on Coumadin for this. States that she went into atrial fibrillation this morning. She's had heart rates up into the 130s and down to the 60s throughout the day. Associated with some anxiety but denies any chest pain or shortness of breath. Patient states that her palpitations and atrial fibrillation have resolved spontaneously. She is currently asymptomatic. Denies any recent illness. No vomiting or diarrhea. No urinary symptoms. Past Medical History:  Diagnosis Date  . Borderline hyperlipidemia   . Borderline hypertension   . Hypothyroid    s/p PTU therapy  . PAF (paroxysmal atrial fibrillation) [facility])     Patient Active Problem List   Diagnosis Date Noted  . Allergic asthma 06/12/2010  . 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 Perennial allergic rhinitis with seasonal variation 05/04/2010  . UNSPECIFIED TACHYCARDIA 12/19/2009  . OBESITY, UNSPECIFIED 09/16/2008  . HYPOTHYROIDISM 09/15/2008  . DYSLIPIDEMIA 09/15/2008  . Essential hypertension 09/15/2008  . ATRIAL FIBRILLATION, PAROXYSMAL 09/15/2008    Past Surgical History:  Procedure Laterality Date  . CESAREAN SECTION    . CHOLECYSTECTOMY    . KNEE SURGERY     right 2014, left 2015, arthroscipic  . LEG SURGERY     Left leg d/t  tumor (benign)  . THYROID SURGERY      OB History    No data available       Home Medications    Prior to Admission medications   Medication Sig Start Date End Date Taking? Authorizing Provider  Besifloxacin HCl (BESIVANCE) 0.6 % SUSP Place 1 drop into the right eye 3 (three) times daily.   Yes [provider]  Bromfenac Sodium (PROLENSA) 0.07 % SOLN Place 1 drop into the right eye at  bedtime.   Yes [provider]  Difluprednate (DUREZOL) 0.05 % EMUL Place 1 drop into the right eye 3 (three) times daily.   Yes [provider]  metoprolol tartrate (LOPRESSOR) 50 MG tablet TAKE ONE TABLET BY MOUTH TWICE DAILY Patient taking differently: Take 50 mg by mouth 2 (two) times daily.  04/03/16  Yes Minus Breeding, MD  omeprazole (PRILOSEC) 10 MG capsule Take 10 mg by mouth daily as needed (acid).    Yes [provider]  warfarin (COUMADIN) 5 MG tablet Take 2.5-5 mg by mouth See admin instructions. 5mg  on Sat/Sun/Tues/Thurs. 2.5mg  on Mon/Wed/Fri   Yes [provider]  diltiazem (CARDIZEM) 30 MG tablet Take 1 tablet (30 mg total) by mouth 2 (two) times daily as needed (tachycardia). 11/26/16   Julianne Rice, MD  warfarin (COUMADIN) 5 MG tablet TAKE 1 TABLET BY MOUTH EVERY EVENING OR AS DIRECTED BY COUMADIN CLINIC Patient not taking: Reported on 11/26/2016 09/10/16   Minus Breeding, MD    Family History Family History  Problem Relation Age of Onset  . Lung cancer Father   . Breast cancer Mother   . Cirrhosis Brother   . Diabetes Brother   . Breast cancer Sister     Social History Social History  Substance Use Topics  . Smoking status: Former Smoker    Packs/day: 0.80    Years: 5.00    Types: Cigarettes    Quit date: 04/08/1982  .  Smokeless tobacco: Never Used  . Alcohol use No     Allergies   Naproxen sodium; Promethazine hcl; and Penicillins   Review of Systems Review of Systems  Constitutional: Negative for chills, diaphoresis, fatigue and fever.  Respiratory: Negative for cough and shortness of breath.   Cardiovascular: Positive for palpitations. Negative for chest pain and leg swelling.  Gastrointestinal: Negative for abdominal pain, constipation, diarrhea, nausea and vomiting.  Genitourinary: Negative for dysuria, flank pain, frequency and hematuria.  Musculoskeletal: Negative for back pain, myalgias, neck pain and neck  stiffness.  Skin: Negative for rash and wound.  Neurological: Negative for dizziness, weakness, light-headedness, numbness and headaches.  Psychiatric/Behavioral: The patient is nervous/anxious.      Physical Exam Updated Vital Signs BP (!) 159/70   Pulse (!) 58   Temp 98.5 F (36.9 C) (Oral)   Resp 17   SpO2 99%   Physical Exam  Constitutional: She is oriented to person, place, and time. She appears well-developed and well-nourished. No distress.  HENT:  Head: Normocephalic and atraumatic.  Mouth/Throat: Oropharynx is clear and moist. No oropharyngeal exudate.  Eyes: Pupils are equal, round, and reactive to light. EOM are normal.  Neck: Normal range of motion. Neck supple. No JVD present.  Cardiovascular: Normal rate and regular rhythm.  Exam reveals no gallop and no friction rub.   No murmur heard. Pulmonary/Chest: Effort normal and breath sounds normal. No respiratory distress. She has no wheezes. She has no rales. She exhibits no tenderness.  Abdominal: Soft. Bowel sounds are normal. There is no tenderness. There is no rebound and no guarding.  Musculoskeletal: Normal range of motion. She exhibits no edema or tenderness.  No lower extremity swelling, asymmetry or tenderness. Distal pulses are 2+.  Neurological: She is alert and oriented to person, place, and time.  Medical extremities without focal deficit. Sensation fully intact.  Skin: Skin is warm and dry. No rash noted. No erythema.  Psychiatric: She has a normal mood and affect. Her behavior is normal.  Nursing note and vitals reviewed.    ED Treatments / Results  Labs (all labs ordered are listed, but only abnormal results are displayed) Labs Reviewed  BASIC METABOLIC PANEL - Abnormal; Notable for the following:       Result Value   Glucose, Bld 119 (*)    All other components within normal limits  PROTIME-INR - Abnormal; Notable for the following:    Prothrombin Time 21.1 (*)    All other components within  normal limits  HEPATIC FUNCTION PANEL - Abnormal; Notable for the following:    Albumin 3.4 (*)    ALT 13 (*)    Bilirubin, Direct <0.1 (*)    All other components within normal limits  LIPASE, BLOOD - Abnormal; Notable for the following:    Lipase 56 (*)    All other components within normal limits  CBC  TSH  T4, FREE  T4, FREE  I-STAT TROPONIN, ED    EKG  EKG Interpretation  Date/Time:  Tuesday November 26 2016 14:09:24 EDT Ventricular Rate:  127 PR Interval:    QRS Duration: 74 QT Interval:  356 QTC Calculation: 517 R Axis:   12 Text Interpretation:  Atrial fibrillation with rapid ventricular response with premature ventricular or aberrantly conducted complexes Cannot rule out Anterior infarct , age undetermined Abnormal ECG Confirmed by Lita Mains  MD, Berman Grainger ([id number]) on 11/26/2016 7:30:27 PM       Radiology Dg Chest 2 View  Result Date: 11/26/2016 CLINICAL  DATA:  Chest pain. EXAM: CHEST  2 VIEW COMPARISON:  Chest x-ray dated October 14, 2012. FINDINGS: Borderline enlarged cardiomediastinal silhouette, unchanged. Both lungs are clear. The visualized skeletal structures are unremarkable. Cholecystectomy. IMPRESSION: No active cardiopulmonary disease. Electronically Signed   By: Titus Dubin M.D.   On: 11/26/2016 14:58    Procedures Procedures (including critical care time)  Medications Ordered in ED Medications - No data to display   Initial Impression / Assessment and Plan / ED Course  I have reviewed the triage vital signs and the nursing notes.  Pertinent labs & imaging results that were available during my care of the patient were reviewed by me and considered in my medical decision making (see chart for details).     Patient remains asymptomatic. Discussed with cardiologist on call. Recommends Cardizem 30 mg twice a day when necessary palpitations. Patient will call make appointment to follow-up with her cardiologist. She's been given strict return precautions and  voiced understanding.  Final Clinical Impressions(s) / ED Diagnoses   Final diagnoses:  Atrial fibrillation with rapid ventricular response (HCC)    New Prescriptions New Prescriptions   DILTIAZEM (CARDIZEM) 30 MG TABLET    Take 1 tablet (30 mg total) by mouth 2 (two) times daily as needed (tachycardia).     Julianne Rice, MD 11/26/16 2140    Julianne Rice, MD 12/14/16 [phone]

## 2016-11-27 ENCOUNTER — Telehealth (HOSPITAL_COMMUNITY): Payer: Self-pay | Admitting: *Deleted

## 2016-11-27 NOTE — Telephone Encounter (Signed)
Pt on afib ED list.  Pt advised to follow up with Dr. Percival Spanish. I cld pt to make sure she has called his office to schedule.  She has not cld as of yet but will call this afternoon as she prefers to see him.  I gave pt contact info.

## 2016-12-04 ENCOUNTER — Ambulatory Visit (INDEPENDENT_AMBULATORY_CARE_PROVIDER_SITE_OTHER): Payer: Medicare Other | Admitting: Pharmacist Clinician (PhC)/ Clinical Pharmacy Specialist

## 2016-12-04 DIAGNOSIS — I480 Paroxysmal atrial fibrillation: Secondary | ICD-10-CM | POA: Diagnosis not present

## 2016-12-04 LAB — POCT INR: INR: 2.1

## 2016-12-05 ENCOUNTER — Ambulatory Visit (INDEPENDENT_AMBULATORY_CARE_PROVIDER_SITE_OTHER): Payer: Medicare Other | Admitting: Physician Assistant

## 2016-12-05 ENCOUNTER — Encounter: Payer: Self-pay | Admitting: Physician Assistant

## 2016-12-05 VITALS — BP 110/62 | HR 54 | Ht 60.0 in | Wt 231.0 lb

## 2016-12-05 DIAGNOSIS — I10 Essential (primary) hypertension: Secondary | ICD-10-CM | POA: Diagnosis not present

## 2016-12-05 DIAGNOSIS — E039 Hypothyroidism, unspecified: Secondary | ICD-10-CM | POA: Diagnosis not present

## 2016-12-05 DIAGNOSIS — E785 Hyperlipidemia, unspecified: Secondary | ICD-10-CM

## 2016-12-05 DIAGNOSIS — L989 Disorder of the skin and subcutaneous tissue, unspecified: Secondary | ICD-10-CM | POA: Diagnosis not present

## 2016-12-05 DIAGNOSIS — I480 Paroxysmal atrial fibrillation: Secondary | ICD-10-CM

## 2016-12-05 NOTE — Patient Instructions (Signed)
Your physician wants you to follow-up in: 6 MONTHS WITH DR HOCHREIN You will receive a reminder letter in the mail two months in advance. If you don't receive a letter, please call our office to schedule the follow-up appointment.   If you need a refill on your cardiac medications before your next appointment, please call your pharmacy.  

## 2016-12-05 NOTE — Progress Notes (Signed)
Cardiology Office Note    Date:  12/07/2016   ID:  FAE BLOSSOM, DOB 1945-07-29, MRN [id number]  PCP:  Kelton Pillar, MD  Cardiologist:  Dr. Percival Spanish  Chief Complaint  Patient presents with  . Follow-up    seen for Dr. Percival Spanish    History of Present Illness:  [name] is a 71 y.o. female with borderline hypertension, borderline hyperlipidemia, hypothyroidism and PAF on coumadin. She was in the ED in December 2017 for palpitation, EKG showed atrial fibrillation. She was not on systemic anticoagulation at the time. She eventually self converted to sinus rhythm on IV Lopressor. On follow-up 04/03/2016, she was agreeable to start on eliquis. However due to the inability to afford eliquis, her compliance was very limited. Therefore she was transitioned to Coumadin in February 2018. Due to recurrence of palpitation, her beta blocker was increased. Flecainide was considered, but patient wished to continue conservative therapy without antiarrhythmic drugs. Last echocardiogram obtained on 04/18/2016 showed EF 60-65%, grade 2 DD. Myoview obtained on 05/22/2016 showed EF 62%, low risk study, no ischemia.  More recently, she was seen in the ED again on 11/26/2016. She went into atrial fibrillation that morning with heart rate up to 130. TSH and free T4 were normal. Patient presents today for follow-up, she is maintaining sinus bradycardia based on EKG. I am unable to up titrate her rate control medication as her heart rate is 54. We discussed various options including continued to use diltiazem PRN for breakthrough atrial fibrillation versus pill-in-the-pocket flecainide. For now we will continue on as needed dose of diltiazem for breakthrough. She says in the past 6 months she only had one breakthrough episode. Prior to that, she had an episode of atrial fibrillation in February. She has very good cardiac awareness of atrial fibrillation. Otherwise she has no lower extremity edema, orthopnea or  PND.  She has redness over area on her left upper extremity, it is warm to touch. She says she just had a pneumococcal shot above the location several days ago. It looks like cellulitis to me, I asked her to follow-up with her primary care provider as soon as possible.    Past Medical History:  Diagnosis Date  . Borderline hyperlipidemia   . Borderline hypertension   . Hypothyroid    s/p PTU therapy  . PAF (paroxysmal atrial fibrillation) ([place])     Past Surgical History:  Procedure Laterality Date  . CESAREAN SECTION    . CHOLECYSTECTOMY    . KNEE SURGERY     right 2014, left 2015, arthroscipic  . LEG SURGERY     Left leg d/t  tumor (benign)  . THYROID SURGERY      Current Medications: Outpatient Medications Prior to Visit  Medication Sig Dispense Refill  . Besifloxacin HCl (BESIVANCE) 0.6 % SUSP Place 1 drop into the right eye 3 (three) times daily.    . Bromfenac Sodium (PROLENSA) 0.07 % SOLN Place 1 drop into the right eye at bedtime.    . Difluprednate (DUREZOL) 0.05 % EMUL Place 1 drop into the right eye 3 (three) times daily.    Marland Kitchen diltiazem (CARDIZEM) 30 MG tablet Take 1 tablet (30 mg total) by mouth 2 (two) times daily as needed (tachycardia). 30 tablet 0  . metoprolol tartrate (LOPRESSOR) 50 MG tablet TAKE ONE TABLET BY MOUTH TWICE DAILY (Patient taking differently: Take 50 mg by mouth 2 (two) times daily. ) 60 tablet 11  . omeprazole (PRILOSEC) 10 MG capsule Take  10 mg by mouth daily as needed (acid).     . warfarin (COUMADIN) 5 MG tablet TAKE 1 TABLET BY MOUTH EVERY EVENING OR AS DIRECTED BY COUMADIN CLINIC 30 tablet 3  . warfarin (COUMADIN) 5 MG tablet Take 2.5-5 mg by mouth See admin instructions. 5mg  on Sat/Sun/Tues/Thurs. 2.5mg  on Mon/Wed/Fri     No facility-administered medications prior to visit.      Allergies:   Naproxen sodium; Penicillins; and Promethazine hcl   Social History   Social History  . Marital status: Married    Spouse name: N/A  . Number  of children: N/A  . Years of education: N/A   Social History Main Topics  . Smoking status: Former Smoker    Packs/day: 0.80    Years: 5.00    Types: Cigarettes    Quit date: 04/08/1982  . Smokeless tobacco: Never Used  . Alcohol use No  . Drug use: No  . Sexual activity: Not Asked   Other Topics Concern  . None   Social History Narrative  . None     Family History:  The patient's family history includes Breast cancer in her mother and sister; Cirrhosis in her brother; Diabetes in her brother; Lung cancer in her father.   ROS:   Please see the history of present illness.    ROS All other systems reviewed and are negative.   PHYSICAL EXAM:   VS:  BP 110/62 (BP Location: Right Arm, Patient Position: Sitting, Cuff Size: Large)   Pulse (!) 54   Ht 5' (1.524 m)   Wt 231 lb (104.8 kg)   BMI 45.11 kg/m    GEN: Well nourished, well developed, in no acute distress  HEENT: normal  Neck: no JVD, carotid bruits, or masses Cardiac: RRR; no murmurs, rubs, or gallops,no edema  Respiratory:  clear to auscultation bilaterally, normal work of breathing GI: soft, nontender, nondistended, + BS MS: no deformity or atrophy  Skin: warm and dry, no rash Neuro:  Alert and Oriented x 3, Strength and sensation are intact Psych: euthymic mood, full affect  Wt Readings from Last 3 Encounters:  12/05/16 231 lb (104.8 kg)  06/19/16 228 lb (103.4 kg)  05/21/16 228 lb (103.4 kg)      Studies/Labs Reviewed:   EKG:  EKG is ordered today.  The ekg ordered today demonstrates sinus bradycardia with HR 54, poor R wave progression in anterior leads  Recent Labs: 11/26/2016: ALT 13; BUN 14; Creatinine, Ser 0.78; Hemoglobin 13.0; Platelets 266; Potassium 4.0; Sodium 139; TSH 2.353   Lipid Panel    Component Value Date/Time   CHOL  05/27/2008 0700    149        ATP III CLASSIFICATION:  <200     mg/dL   Desirable  200-239  mg/dL   Borderline High  >=240    mg/dL   High          TRIG 54  05/27/2008 0700   HDL 51 05/27/2008 0700   CHOLHDL 2.9 05/27/2008 0700   VLDL 11 05/27/2008 0700   LDLCALC  05/27/2008 0700    87        Total Cholesterol/HDL:CHD Risk Coronary Heart Disease Risk Table                     Men   Women  1/2 Average Risk   3.4   3.3  Average Risk       5.0   4.4  2 X  Average Risk   9.6   7.1  3 X Average Risk  23.4   11.0        Use the calculated Patient Ratio above and the CHD Risk Table to determine the patient's CHD Risk.        ATP III CLASSIFICATION (LDL):  <100     mg/dL   Optimal  100-129  mg/dL   Near or Above                    Optimal  130-159  mg/dL   Borderline  160-189  mg/dL   High  >190     mg/dL   Very High    Additional studies/ records that were reviewed today include:   Echo 04/18/2016 - Left ventricle: The cavity size was normal. Wall thickness was   normal. Systolic function was normal. The estimated ejection   fraction was in the range of 60% to 65%. Wall motion was normal;   there were no regional wall motion abnormalities. Features are   consistent with a pseudonormal left ventricular filling pattern,   with concomitant abnormal relaxation and increased filling   pressure (grade 2 diastolic dysfunction).   Myoview 05/22/2016 Study Highlights    The left ventricular ejection fraction is normal (55-65%).  Nuclear stress EF: 62%.  There was no ST segment deviation noted during stress.  The study is normal.  This is a low risk study.     ASSESSMENT:    1. Paroxysmal atrial fibrillation (HCC)   2. Hypothyroidism, unspecified type   3. Essential hypertension   4. Hyperlipidemia, unspecified hyperlipidemia type   5. Skin lesion of left arm      PLAN:  In order of problems listed above:  1. PAF: Only one recurrence in the past 6 months. Prior to that she had another episode in February. She did have an recent negative Myoview. We discussed the various options including PRN diltiazem versus pill in the  pocket flecainide. She wished to continue the current rate control medication with PRN diltiazem. I am unable to up titrate the current beta blocker due to baseline bradycardia.  2. Possible left upper arm cellulitis: Recently underwent pneumococcal injection above the area, if feels warm and erythematous to me. I am concerned of possible cellulitis, I instructed her to discuss this with her primary care provider as soon as possible.  3. Borderline hypertension: Blood pressure stable  4. Borderline hyperlipidemia: We'll defer to primary care provider  5. Hypothyroidism: TSH and free T4 normal on 11/26/2016    Medication Adjustments/Labs and Tests Ordered: Current medicines are reviewed at length with the patient today.  Concerns regarding medicines are outlined above.  Medication changes, Labs and Tests ordered today are listed in the Patient Instructions below. Patient Instructions  Your physician wants you to follow-up in: [place] will receive a reminder letter in the mail two months in advance. If you don't receive a letter, please call our office to schedule the follow-up appointment.   If you need a refill on your cardiac medications before your next appointment, please call your pharmacy.     Hilbert Corrigan, [place]  12/07/2016 9:57 AM    [address], [place], [place]  [address] Phone: [phone]; Fax: [phone]

## 2016-12-07 ENCOUNTER — Encounter: Payer: Self-pay | Admitting: Physician Assistant

## 2017-01-14 ENCOUNTER — Ambulatory Visit (INDEPENDENT_AMBULATORY_CARE_PROVIDER_SITE_OTHER): Payer: Medicare Other | Admitting: Pharmacist Clinician (PhC)/ Clinical Pharmacy Specialist

## 2017-01-14 DIAGNOSIS — Z7901 Long term (current) use of anticoagulants: Secondary | ICD-10-CM | POA: Diagnosis not present

## 2017-01-14 DIAGNOSIS — Z5181 Encounter for therapeutic drug level monitoring: Secondary | ICD-10-CM | POA: Diagnosis not present

## 2017-01-14 DIAGNOSIS — I480 Paroxysmal atrial fibrillation: Secondary | ICD-10-CM | POA: Diagnosis not present

## 2017-01-14 LAB — POCT INR: INR: 2.7

## 2017-02-08 ENCOUNTER — Other Ambulatory Visit: Payer: Self-pay | Admitting: Cardiology

## 2017-02-25 ENCOUNTER — Ambulatory Visit (INDEPENDENT_AMBULATORY_CARE_PROVIDER_SITE_OTHER): Payer: Medicare Other | Admitting: Pharmacist

## 2017-02-25 DIAGNOSIS — Z7901 Long term (current) use of anticoagulants: Secondary | ICD-10-CM | POA: Diagnosis not present

## 2017-02-25 DIAGNOSIS — I480 Paroxysmal atrial fibrillation: Secondary | ICD-10-CM | POA: Diagnosis not present

## 2017-02-25 LAB — POCT INR: INR: 1.4

## 2017-03-24 ENCOUNTER — Ambulatory Visit (INDEPENDENT_AMBULATORY_CARE_PROVIDER_SITE_OTHER): Payer: Medicare Other | Admitting: Pharmacist

## 2017-03-24 DIAGNOSIS — Z7901 Long term (current) use of anticoagulants: Secondary | ICD-10-CM

## 2017-03-24 DIAGNOSIS — I480 Paroxysmal atrial fibrillation: Secondary | ICD-10-CM | POA: Diagnosis not present

## 2017-03-24 LAB — POCT INR: INR: 2.2

## 2017-04-09 ENCOUNTER — Other Ambulatory Visit: Payer: Self-pay | Admitting: Cardiology

## 2017-04-28 ENCOUNTER — Other Ambulatory Visit: Payer: Self-pay | Admitting: Family Medicine

## 2017-04-28 DIAGNOSIS — Z139 Encounter for screening, unspecified: Secondary | ICD-10-CM

## 2017-05-05 ENCOUNTER — Ambulatory Visit (INDEPENDENT_AMBULATORY_CARE_PROVIDER_SITE_OTHER): Payer: Medicare Other | Admitting: Pharmacist

## 2017-05-05 DIAGNOSIS — Z7901 Long term (current) use of anticoagulants: Secondary | ICD-10-CM | POA: Diagnosis not present

## 2017-05-05 DIAGNOSIS — I480 Paroxysmal atrial fibrillation: Secondary | ICD-10-CM | POA: Diagnosis not present

## 2017-05-05 LAB — POCT INR: INR: 2.5

## 2017-06-01 NOTE — Progress Notes (Signed)
HPI The patient presents for followup of atrial fibrillation.  She was in the ED on 03/22/16 with palpitations.  She was back in the ED with atrial fib in August.  Since then she has had palpitations about 3 times.  One lasted 24 hours but for the most part they are well controlled.  She takes extra Cardizem and this might help.  She denies any presyncope or syncope.  She is not having any chest discomfort, neck or arm discomfort.  She has had no weight gain or edema.  She still takes care of her 3-year-old twin grandchildren and a 8-year-old every other week all day long.   Allergies  Allergen Reactions  . Naproxen Sodium Other (See Comments)    Hurt all over  . Penicillins Rash    Has patient had a PCN reaction causing immediate rash, facial/tongue/throat swelling, SOB or lightheadedness with hypotension: Yes Has patient had a PCN reaction causing severe rash involving mucus membranes or skin necrosis: Yes Has patient had a PCN reaction that required hospitalization: No Has patient had a PCN reaction occurring within the last 10 years: No If all of the above answers are "NO", then may proceed with Cephalosporin use.   . Promethazine Hcl Other (See Comments)    Sick     Current Outpatient Medications  Medication Sig Dispense Refill  . diltiazem (CARDIZEM) 30 MG tablet Take 1 tablet (30 mg total) by mouth 2 (two) times daily as needed (tachycardia). 30 tablet 0  . metoprolol tartrate (LOPRESSOR) 50 MG tablet TAKE ONE TABLET BY MOUTH TWICE DAILY 60 tablet 11  . omeprazole (PRILOSEC) 10 MG capsule Take 10 mg by mouth daily as needed (acid).     . warfarin (COUMADIN) 5 MG tablet TAKE 1 TABLET BY MOUTH EVERY EVENING OR AS DIRECTED BY COUMADIN CLINIC 90 tablet 1   No current facility-administered medications for this visit.     Past Medical History:  Diagnosis Date  . Borderline hyperlipidemia   . Borderline hypertension   . Hypothyroid    s/p PTU therapy  . PAF (paroxysmal atrial  fibrillation) ([place])     Past Surgical History:  Procedure Laterality Date  . CESAREAN SECTION    . CHOLECYSTECTOMY    . KNEE SURGERY     right 2014, left 2015, arthroscipic  . LEG SURGERY     Left leg d/t  tumor (benign)  . THYROID SURGERY      ROS:   As stated in the HPI and negative for all other systems.   PHYSICAL EXAM BP (!) 146/78 (BP Location: Left Arm)   Pulse (!) 51   Ht 5' (1.524 m)   Wt 229 lb 12.8 oz (104.2 kg)   BMI 44.88 kg/m   GENERAL:  Well appearing NECK:  No jugular venous distention, waveform within normal limits, carotid upstroke brisk and symmetric, no bruits, no thyromegaly LUNGS:  Clear to auscultation bilaterally CHEST:  Unremarkable HEART:  PMI not displaced or sustained,S1 and S2 within normal limits, no S3, no S4, no clicks, no rubs, no murmurs ABD:  Flat, positive bowel sounds normal in frequency in pitch, no bruits, no rebound, no guarding, no midline pulsatile mass, no hepatomegaly, no splenomegaly EXT:  2 plus pulses throughout, no edema, no cyanosis no clubbing   EKG:    Sinus rhythm, rate 51, axis within normal limits, intervals within normal limits, no acute ST-T wave changes.  06/03/2017    ASSESSMENT AND PLAN  ATRIAL FIBRILLATION, PAROXYSMAL  [name] has a CHA2DS2 - VASc score of 2 with a risk of stroke of 2.2%.   She has sinus bradycardia which has precluded med titration.  We talked again today about potentially using flecainide but she would prefer just to use pill in pocket medications as listed and we talked about strategies for this.  She will let me know if she has any worsening symptoms.  HYPERTENSION The blood pressure is slightly elevated today but this is unusual.  She will keep a blood pressure diary.  No change in therapy.

## 2017-06-03 ENCOUNTER — Encounter: Payer: Self-pay | Admitting: Cardiology

## 2017-06-03 ENCOUNTER — Ambulatory Visit: Payer: Medicare Other | Admitting: Cardiology

## 2017-06-03 VITALS — BP 146/78 | HR 51 | Ht 60.0 in | Wt 229.8 lb

## 2017-06-03 DIAGNOSIS — I480 Paroxysmal atrial fibrillation: Secondary | ICD-10-CM

## 2017-06-03 DIAGNOSIS — I10 Essential (primary) hypertension: Secondary | ICD-10-CM | POA: Diagnosis not present

## 2017-06-03 NOTE — Patient Instructions (Signed)
Medication Instructions:  Continue current medications  If you need a refill on your cardiac medications before your next appointment, please call your pharmacy.  Labwork: None Ordered    Testing/Procedures: None Ordered   Follow-Up: Your physician wants you to follow-up in: 6 Months with Almyra Deforest. You should receive a reminder letter in the mail two months in advance. If you do not receive a letter, please call our office [phone].    Thank you for choosing [facility] at [facility]!!

## 2017-06-11 ENCOUNTER — Ambulatory Visit
Admission: RE | Admit: 2017-06-11 | Discharge: 2017-06-11 | Disposition: A | Discharge status: Home / Self Care | Payer: Medicare Other | Source: Ambulatory Visit | Attending: Family Medicine | Admitting: Family Medicine

## 2017-06-11 DIAGNOSIS — Z139 Encounter for screening, unspecified: Secondary | ICD-10-CM

## 2017-06-16 ENCOUNTER — Ambulatory Visit (INDEPENDENT_AMBULATORY_CARE_PROVIDER_SITE_OTHER): Payer: Medicare Other | Admitting: Pharmacist

## 2017-06-16 DIAGNOSIS — I480 Paroxysmal atrial fibrillation: Secondary | ICD-10-CM | POA: Diagnosis not present

## 2017-06-16 LAB — POCT INR: INR: 2.1

## 2017-07-28 ENCOUNTER — Ambulatory Visit (INDEPENDENT_AMBULATORY_CARE_PROVIDER_SITE_OTHER): Payer: Medicare Other | Admitting: Pharmacist

## 2017-07-28 DIAGNOSIS — I480 Paroxysmal atrial fibrillation: Secondary | ICD-10-CM

## 2017-07-28 DIAGNOSIS — Z7901 Long term (current) use of anticoagulants: Secondary | ICD-10-CM | POA: Diagnosis not present

## 2017-07-28 LAB — POCT INR: INR: 2

## 2017-09-09 ENCOUNTER — Ambulatory Visit (INDEPENDENT_AMBULATORY_CARE_PROVIDER_SITE_OTHER): Payer: Medicare Other | Admitting: Pharmacist

## 2017-09-09 DIAGNOSIS — I480 Paroxysmal atrial fibrillation: Secondary | ICD-10-CM | POA: Diagnosis not present

## 2017-09-09 DIAGNOSIS — Z7901 Long term (current) use of anticoagulants: Secondary | ICD-10-CM

## 2017-09-09 LAB — POCT INR: INR: 2.8 (ref 2.0–3.0)

## 2017-09-27 ENCOUNTER — Other Ambulatory Visit: Payer: Self-pay | Admitting: Cardiology

## 2017-10-21 ENCOUNTER — Ambulatory Visit (INDEPENDENT_AMBULATORY_CARE_PROVIDER_SITE_OTHER): Payer: Medicare Other | Admitting: Pharmacist

## 2017-10-21 DIAGNOSIS — Z7901 Long term (current) use of anticoagulants: Secondary | ICD-10-CM

## 2017-10-21 DIAGNOSIS — I480 Paroxysmal atrial fibrillation: Secondary | ICD-10-CM

## 2017-10-21 LAB — POCT INR: INR: 2.1 (ref 2.0–3.0)

## 2017-12-02 ENCOUNTER — Encounter: Payer: Self-pay | Admitting: Physician Assistant

## 2017-12-02 ENCOUNTER — Ambulatory Visit: Payer: Medicare Other | Admitting: Physician Assistant

## 2017-12-02 ENCOUNTER — Ambulatory Visit (INDEPENDENT_AMBULATORY_CARE_PROVIDER_SITE_OTHER): Payer: Medicare Other | Admitting: Pharmacist

## 2017-12-02 VITALS — BP 122/70 | HR 47 | Ht 60.0 in | Wt 232.0 lb

## 2017-12-02 DIAGNOSIS — I480 Paroxysmal atrial fibrillation: Secondary | ICD-10-CM

## 2017-12-02 DIAGNOSIS — I10 Essential (primary) hypertension: Secondary | ICD-10-CM

## 2017-12-02 LAB — POCT INR: INR: 2.3 (ref 2.0–3.0)

## 2017-12-02 MED ORDER — DILTIAZEM HCL 30 MG PO TABS: 30.0000 mg | ORAL_TABLET | Freq: Two times a day (BID) | ORAL | 4 refills | Status: DC | PRN

## 2017-12-02 MED ORDER — DILTIAZEM HCL 30 MG PO TABS: 30.0000 mg | ORAL_TABLET | Freq: Two times a day (BID) | ORAL | 0 refills | Status: DC | PRN

## 2017-12-02 NOTE — Patient Instructions (Signed)
Medication Instructions:  Your physician recommends that you continue on your current medications as directed. Please refer to the Current Medication list given to you today. If you need a refill on your cardiac medications before your next appointment, please call your pharmacy.  Labwork: None   Testing/Procedures: None   Follow-Up: Your physician wants you to follow-up in: 6 months with Dr Percival Spanish. You will receive a reminder letter in the mail two months in advance. If you don't receive a letter, please call our office to schedule the follow-up appointment.  Any Other Special Instructions Will Be Listed Below (If Applicable).

## 2017-12-02 NOTE — Progress Notes (Signed)
Cardiology Office Note    Date:  12/02/2017   ID:  [name], DOB 1946/01/24, MRN [id number]  PCP:  Kelton Pillar, MD  Cardiologist:  Dr. Percival Spanish   Chief Complaint  Patient presents with  . Follow-up    seen for Dr. Percival Spanish.     History of Present Illness:  [name] is a 72 y.o. female with borderline hypertension, borderline hyperlipidemia, hypothyroidism and PAF on coumadin. She was in the ED in December 2017 for palpitation, EKG showed atrial fibrillation. She was not on systemic anticoagulation at the time. She eventually self converted to sinus rhythm on IV Lopressor. During the follow-up on 04/03/2016, she was agreeable to start on eliquis. However due to the inability to afford eliquis, her compliance was very limited. Therefore she was transitioned to Coumadin in February 2018. Due to recurrence of palpitation, her beta blocker was increased. Flecainide was considered, but patient wished to continue conservative therapy without antiarrhythmic drugs. Last echocardiogram obtained on 04/18/2016 showed EF 60-65%, grade 2 DD. Myoview obtained on 05/22/2016 showed EF 62%, low risk study, no ischemia.  She returned to the ED in August 2018 with recurrent atrial fibrillation.  TSH and free T4 were normal.  I last saw the patient on 12/05/2016, I was unable to uptitrate her beta-blocker dosage due to baseline bradycardia.  Since the last office visit, patient had another episode of recurrent atrial fibrillation roughly 2 weeks ago.  She felt her heart going in and out of atrial fibrillation multiple times throughout the day, however has not recurred since.  This is the only episodes in the past 30-month after her previous A. fib in February 2019.  Her heart rate is bradycardic on today's exam, she denies any significant dizziness, blurred vision feeling of passing out.  I recommend to continue on the current rate control therapy.  Reducing the metoprolol likely will make her heart  rate looks better, however she will have increased recurrence of atrial fibrillation.  She says her primary care provider has been checking her cholesterol, I am unable to see it in the epic system.    Past Medical History:  Diagnosis Date  . Borderline hyperlipidemia   . Borderline hypertension   . Hypothyroid    s/p PTU therapy  . PAF (paroxysmal atrial fibrillation) ([place])     Past Surgical History:  Procedure Laterality Date  . CESAREAN SECTION    . CHOLECYSTECTOMY    . KNEE SURGERY     right 2014, left 2015, arthroscipic  . LEG SURGERY     Left leg d/t  tumor (benign)  . THYROID SURGERY      Current Medications: Outpatient Medications Prior to Visit  Medication Sig Dispense Refill  . metoprolol tartrate (LOPRESSOR) 50 MG tablet TAKE ONE TABLET BY MOUTH TWICE DAILY 60 tablet 11  . omeprazole (PRILOSEC) 10 MG capsule Take 10 mg by mouth daily as needed (acid).     . warfarin (COUMADIN) 5 MG tablet Take 1/2 to 1 tablet dailt as directed by coumadin clinic 90 tablet 1  . diltiazem (CARDIZEM) 30 MG tablet Take 1 tablet (30 mg total) by mouth 2 (two) times daily as needed (tachycardia). 30 tablet 0   No facility-administered medications prior to visit.      Allergies:   Naproxen sodium; Penicillins; and Promethazine hcl   Social History   Socioeconomic History  . Marital status: Married    Spouse name: Not on file  . Number of children: Not  on file  . Years of education: Not on file  . Highest education level: Not on file  Occupational History  . Not on file  Social Needs  . Financial resource strain: Not on file  . Food insecurity:    Worry: Not on file    Inability: Not on file  . Transportation needs:    Medical: Not on file    Non-medical: Not on file  Tobacco Use  . Smoking status: Former Smoker    Packs/day: 0.80    Years: 5.00    Pack years: 4.00    Types: Cigarettes    Last attempt to quit: 04/08/1982    Years since quitting: 35.6  . Smokeless  tobacco: Never Used  Substance and Sexual Activity  . Alcohol use: No  . Drug use: No  . Sexual activity: Not on file  Lifestyle  . Physical activity:    Days per week: Not on file    Minutes per session: Not on file  . Stress: Not on file  Relationships  . Social connections:    Talks on phone: Not on file    Gets together: Not on file    Attends religious service: Not on file    Active member of club or organization: Not on file    Attends meetings of clubs or organizations: Not on file    Relationship status: Not on file  Other Topics Concern  . Not on file  Social History Narrative  . Not on file     Family History:  The patient's family history includes Breast cancer in her mother and sister; Cirrhosis in her brother; Diabetes in her brother; Lung cancer in her father.   ROS:   Please see the history of present illness.    ROS All other systems reviewed and are negative.   PHYSICAL EXAM:   VS:  BP 122/70   Pulse (!) 47   Ht 5' (1.524 m)   Wt 232 lb (105.2 kg)   BMI 45.31 kg/m    GEN: Well nourished, well developed, in no acute distress  HEENT: normal  Neck: no JVD, carotid bruits, or masses Cardiac: RRR; no murmurs, rubs, or gallops,no edema  Respiratory:  clear to auscultation bilaterally, normal work of breathing GI: soft, nontender, nondistended, + BS MS: no deformity or atrophy  Skin: warm and dry, no rash Neuro:  Alert and Oriented x 3, Strength and sensation are intact Psych: euthymic mood, full affect  Wt Readings from Last 3 Encounters:  12/02/17 232 lb (105.2 kg)  06/03/17 229 lb 12.8 oz (104.2 kg)  12/05/16 231 lb (104.8 kg)      Studies/Labs Reviewed:   EKG:  EKG is ordered today.  The ekg ordered today demonstrates sinus bradycardia, heart rate 47  Recent Labs: No results found for requested labs within last 8760 hours.   Lipid Panel    Component Value Date/Time   CHOL  05/27/2008 0700    149        ATP III CLASSIFICATION:  <200      mg/dL   Desirable  200-239  mg/dL   Borderline High  >=240    mg/dL   High          TRIG 54 05/27/2008 0700   HDL 51 05/27/2008 0700   CHOLHDL 2.9 05/27/2008 0700   VLDL 11 05/27/2008 0700   LDLCALC  05/27/2008 0700    87        Total Cholesterol/HDL:CHD Risk Coronary  Heart Disease Risk Table                     Men   Women  1/2 Average Risk   3.4   3.3  Average Risk       5.0   4.4  2 X Average Risk   9.6   7.1  3 X Average Risk  23.4   11.0        Use the calculated Patient Ratio above and the CHD Risk Table to determine the patient's CHD Risk.        ATP III CLASSIFICATION (LDL):  <100     mg/dL   Optimal  100-129  mg/dL   Near or Above                    Optimal  130-159  mg/dL   Borderline  160-189  mg/dL   High  >190     mg/dL   Very High    Additional studies/ records that were reviewed today include:   Echo 04/18/2016 - Left ventricle: The cavity size was normal. Wall thickness was normal. Systolic function was normal. The estimated ejection fraction was in the range of 60% to 65%. Wall motion was normal; there were no regional wall motion abnormalities. Features are consistent with a pseudonormal left ventricular filling pattern, with concomitant abnormal relaxation and increased filling pressure (grade 2 diastolic dysfunction).   Myoview 05/22/2016 Study Highlights    The left ventricular ejection fraction is normal (55-65%).  Nuclear stress EF: 62%.  There was no ST segment deviation noted during stress.  The study is normal.  This is a low risk study.      ASSESSMENT:    1. Paroxysmal atrial fibrillation (HCC)   2. Essential hypertension      PLAN:  In order of problems listed above:  1. Paroxysmal atrial fibrillation: On metoprolol 50 mg twice daily.  Heart rate 47 on today's EKG.  Since her ED visit last August, she had a total of 2 episodes of atrial fibrillation.  One was in February, another episode occurred 2 weeks  ago.  Given the rare occurrence of atrial fibrillation, will continue on current therapy.  Reducing her metoprolol likely will improve the heart rate, however will cause her to have more atrial fibrillation.  Given lack of any symptoms associated with borderline bradycardia, I we will hold off on any medication changes.  If the frequency of her atrial fibrillation does increase, we can consider placing her on low-dose flecainide therapy.  2. Hypertension: Blood pressure stable on current therapy    Medication Adjustments/Labs and Tests Ordered: Current medicines are reviewed at length with the patient today.  Concerns regarding medicines are outlined above.  Medication changes, Labs and Tests ordered today are listed in the Patient Instructions below. Patient Instructions  Medication Instructions:  Your physician recommends that you continue on your current medications as directed. Please refer to the Current Medication list given to you today. If you need a refill on your cardiac medications before your next appointment, please call your pharmacy.  Labwork: None   Testing/Procedures: None   Follow-Up: Your physician wants you to follow-up in: 6 months with Dr Percival Spanish. You will receive a reminder letter in the mail two months in advance. If you don't receive a letter, please call our office to schedule the follow-up appointment.  Any Other Special Instructions Will Be Listed Below (If Applicable).  Hilbert Corrigan, [place]  12/02/2017 9:41 AM    [address], [place], [place]  [address] Phone: [phone]; Fax: [phone]

## 2018-01-14 ENCOUNTER — Ambulatory Visit (INDEPENDENT_AMBULATORY_CARE_PROVIDER_SITE_OTHER): Payer: Medicare Other | Admitting: Pharmacist Clinician (PhC)/ Clinical Pharmacy Specialist

## 2018-01-14 DIAGNOSIS — Z7901 Long term (current) use of anticoagulants: Secondary | ICD-10-CM

## 2018-01-14 DIAGNOSIS — I480 Paroxysmal atrial fibrillation: Secondary | ICD-10-CM

## 2018-01-14 LAB — POCT INR: INR: 2.2 (ref 2.0–3.0)

## 2018-01-14 NOTE — Patient Instructions (Signed)
Description   Continue with 1 tablet daily except 1/2 tablet each Monday, Wednesday and Friday. Repeat INR in 6 weeks.      

## 2018-02-23 ENCOUNTER — Ambulatory Visit (INDEPENDENT_AMBULATORY_CARE_PROVIDER_SITE_OTHER): Payer: Medicare Other | Admitting: Pharmacist

## 2018-02-23 DIAGNOSIS — I480 Paroxysmal atrial fibrillation: Secondary | ICD-10-CM | POA: Diagnosis not present

## 2018-02-23 DIAGNOSIS — Z7901 Long term (current) use of anticoagulants: Secondary | ICD-10-CM

## 2018-02-23 LAB — POCT INR: INR: 2 (ref 2.0–3.0)

## 2018-04-06 ENCOUNTER — Ambulatory Visit (INDEPENDENT_AMBULATORY_CARE_PROVIDER_SITE_OTHER): Payer: Medicare Other | Admitting: Pharmacist

## 2018-04-06 DIAGNOSIS — I480 Paroxysmal atrial fibrillation: Secondary | ICD-10-CM | POA: Diagnosis not present

## 2018-04-06 LAB — POCT INR: INR: 2.6 (ref 2.0–3.0)

## 2018-04-09 ENCOUNTER — Other Ambulatory Visit: Payer: Self-pay | Admitting: Cardiology

## 2018-04-09 MED ORDER — WARFARIN SODIUM 5 MG PO TABS: ORAL_TABLET | ORAL | 1 refills | Status: DC

## 2018-04-30 ENCOUNTER — Other Ambulatory Visit: Payer: Self-pay | Admitting: Family Medicine

## 2018-04-30 DIAGNOSIS — Z1231 Encounter for screening mammogram for malignant neoplasm of breast: Secondary | ICD-10-CM

## 2018-05-18 ENCOUNTER — Ambulatory Visit (INDEPENDENT_AMBULATORY_CARE_PROVIDER_SITE_OTHER): Payer: Medicare Other | Admitting: *Deleted

## 2018-05-18 DIAGNOSIS — I480 Paroxysmal atrial fibrillation: Secondary | ICD-10-CM | POA: Diagnosis not present

## 2018-05-18 DIAGNOSIS — Z5181 Encounter for therapeutic drug level monitoring: Secondary | ICD-10-CM

## 2018-05-18 LAB — POCT INR: INR: 2.9 (ref 2.0–3.0)

## 2018-05-18 NOTE — Patient Instructions (Signed)
Description   Continue with 1 tablet daily except 1/2 tablet each Monday, Wednesday and Friday. Repeat INR in 6 weeks.      

## 2018-05-31 NOTE — Progress Notes (Signed)
HPI The patient presents for followup of atrial fibrillation.  She was in the ED on 03/22/16 with palpitations.  She was back in the ED with atrial fib in August 2018.  She was seen in our office last in Aug 2018.   She had had relatively few symptoms at that point.  She could not, at that point afford Eliquis and she did not want rhythm management with flecainide.  We could not titrate beta blockers because of bradycardia.  She returns for follow up.    Since I last saw her she has done okay.  She occasionally will get some palpitations.  She had 3 this months she said of what she describes as skipping beats that might be her fibrillation.  One episode lasted for about 5 hours.  They seem to be at night when she tried to go to bed.  However, she is not describing any presyncope or syncope.  She is not really getting them that limit her during the day.  She denies any chest pressure, neck or arm discomfort.  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   Allergies  Allergen Reactions  . Naproxen Sodium Other (See Comments)    Hurt all over  . Penicillins Rash    Has patient had a PCN reaction causing immediate rash, facial/tongue/throat swelling, SOB or lightheadedness with hypotension: Yes Has patient had a PCN reaction causing severe rash involving mucus membranes or skin necrosis: Yes Has patient had a PCN reaction that required hospitalization: No Has patient had a PCN reaction occurring within the last 10 years: No If all of the above answers are "NO", then may proceed with Cephalosporin use.   . Promethazine Hcl Other (See Comments)    Sick     Current Outpatient Medications  Medication Sig Dispense Refill  . diltiazem (CARDIZEM) 30 MG tablet Take 1 tablet (30 mg total) by mouth 2 (two) times daily as needed (tachycardia). 60 tablet 4  . metoprolol tartrate (LOPRESSOR) 50 MG tablet TAKE 1 TABLET BY MOUTH TWICE DAILY 60 tablet 7  . omeprazole (PRILOSEC) 10 MG capsule Take 10 mg by  mouth daily as needed (acid).     . warfarin (COUMADIN) 5 MG tablet TAKE 1/2 TO 1 (ONE-HALF TO ONE) TABLET BY MOUTH  DAILY AS DIRECTED BY COUMADIN CLINIC 90 tablet 1   No current facility-administered medications for this visit.     Past Medical History:  Diagnosis Date  . Borderline hyperlipidemia   . Borderline hypertension   . Hypothyroid    s/p PTU therapy  . PAF (paroxysmal atrial fibrillation) ([name])     Past Surgical History:  Procedure Laterality Date  . CESAREAN SECTION    . CHOLECYSTECTOMY    . KNEE SURGERY     right 2014, left 2015, arthroscipic  . LEG SURGERY     Left leg d/t  tumor (benign)  . THYROID SURGERY      ROS:   As stated in the HPI and negative for all other systems.   PHYSICAL EXAM BP (!) 142/78   Pulse (!) 54   Ht 5' (1.524 m)   Wt 234 lb (106.1 kg)   BMI 45.70 kg/m   GENERAL:  Well appearing NECK:  No jugular venous distention, waveform within normal limits, carotid upstroke brisk and symmetric, no bruits, no thyromegaly LUNGS:  Clear to auscultation bilaterally CHEST:  Unremarkable HEART:  PMI not displaced or sustained,S1 and S2 within normal limits, no S3, no S4, no  clicks, no rubs, no murmurs ABD:  Flat, positive bowel sounds normal in frequency in pitch, no bruits, no rebound, no guarding, no midline pulsatile mass, no hepatomegaly, no splenomegaly EXT:  2 plus pulses throughout, no edema, no cyanosis no clubbing    EKG:    NA   ASSESSMENT AND PLAN  ATRIAL FIBRILLATION, PAROXYSMAL  [name] has a CHA2DS2 - VASc score of 2 to 3 .  We talked again about starting flecainide but she prefers not to start this.  She does not think her palpitations are that significant.  She is tolerating anticoagulation.  No change in therapy.  HYPERTENSION The blood pressure is slightly elevated today.  At other times it is been well controlled.  She says it fluctuates a little at home.  She does not want to take another medication.  We  discussed weight loss with diet.  In particular it sounds like she needs a lot of bread.  She does not exercise.  She will probably reach target with therapeutic lifestyle changes.

## 2018-06-01 ENCOUNTER — Encounter: Payer: Self-pay | Admitting: Cardiology

## 2018-06-01 ENCOUNTER — Ambulatory Visit: Payer: Medicare Other | Admitting: Cardiology

## 2018-06-01 VITALS — BP 142/78 | HR 54 | Ht 60.0 in | Wt 234.0 lb

## 2018-06-01 DIAGNOSIS — I10 Essential (primary) hypertension: Secondary | ICD-10-CM

## 2018-06-01 DIAGNOSIS — I480 Paroxysmal atrial fibrillation: Secondary | ICD-10-CM | POA: Diagnosis not present

## 2018-06-01 NOTE — Patient Instructions (Addendum)
Medication Instructions:  Continue current medications  If you need a refill on your cardiac medications before your next appointment, please call your pharmacy.  Labwork: None Ordered   Testing/Procedures: None Ordered  Follow-Up: . You will need a follow up appointment in 6 months with [facility], you and your health needs are our priority.  As part of our continuing mission to provide you with exceptional heart care, we have created designated Provider Care Teams.  These Care Teams include your primary Cardiologist (physician) and Advanced Practice Providers (APPs -  Physician Assistants and Nurse Practitioners) who all work together to provide you with the care you need, when you need it.  Thank you for choosing [facility]!!

## 2018-06-15 ENCOUNTER — Ambulatory Visit
Admission: RE | Admit: 2018-06-15 | Discharge: 2018-06-15 | Disposition: A | Discharge status: Home / Self Care | Payer: Medicare Other | Source: Ambulatory Visit | Attending: Family Medicine | Admitting: Family Medicine

## 2018-06-15 DIAGNOSIS — Z1231 Encounter for screening mammogram for malignant neoplasm of breast: Secondary | ICD-10-CM

## 2018-06-21 ENCOUNTER — Encounter (HOSPITAL_COMMUNITY): Payer: Self-pay

## 2018-06-21 ENCOUNTER — Ambulatory Visit (HOSPITAL_COMMUNITY)
Admission: EM | Admit: 2018-06-21 | Discharge: 2018-06-21 | Disposition: A | Discharge status: Home / Self Care | Payer: Medicare Other | Source: Home / Self Care | Attending: Family Medicine | Admitting: Family Medicine

## 2018-06-21 ENCOUNTER — Encounter (HOSPITAL_COMMUNITY): Payer: Self-pay | Admitting: Emergency Medicine

## 2018-06-21 ENCOUNTER — Emergency Department (HOSPITAL_COMMUNITY): Payer: Medicare Other

## 2018-06-21 ENCOUNTER — Emergency Department (HOSPITAL_COMMUNITY)
Admission: EM | Admit: 2018-06-21 | Discharge: 2018-06-21 | Disposition: A | Discharge status: Home / Self Care | Payer: Medicare Other | Attending: Emergency Medicine | Admitting: Emergency Medicine

## 2018-06-21 ENCOUNTER — Other Ambulatory Visit: Payer: Self-pay

## 2018-06-21 DIAGNOSIS — F17210 Nicotine dependence, cigarettes, uncomplicated: Secondary | ICD-10-CM | POA: Insufficient documentation

## 2018-06-21 DIAGNOSIS — J101 Influenza due to other identified influenza virus with other respiratory manifestations: Secondary | ICD-10-CM | POA: Insufficient documentation

## 2018-06-21 DIAGNOSIS — E039 Hypothyroidism, unspecified: Secondary | ICD-10-CM | POA: Insufficient documentation

## 2018-06-21 DIAGNOSIS — Z880 Allergy status to penicillin: Secondary | ICD-10-CM | POA: Insufficient documentation

## 2018-06-21 DIAGNOSIS — R05 Cough: Secondary | ICD-10-CM | POA: Diagnosis present

## 2018-06-21 DIAGNOSIS — Z7901 Long term (current) use of anticoagulants: Secondary | ICD-10-CM | POA: Insufficient documentation

## 2018-06-21 DIAGNOSIS — I959 Hypotension, unspecified: Secondary | ICD-10-CM | POA: Diagnosis not present

## 2018-06-21 DIAGNOSIS — I4891 Unspecified atrial fibrillation: Secondary | ICD-10-CM

## 2018-06-21 DIAGNOSIS — E860 Dehydration: Secondary | ICD-10-CM

## 2018-06-21 DIAGNOSIS — I10 Essential (primary) hypertension: Secondary | ICD-10-CM | POA: Insufficient documentation

## 2018-06-21 HISTORY — DX: Essential (primary) hypertension: I10

## 2018-06-21 LAB — URINALYSIS, ROUTINE W REFLEX MICROSCOPIC
Bilirubin Urine: NEGATIVE
Glucose, UA: NEGATIVE mg/dL
Ketones, ur: NEGATIVE mg/dL
Leukocytes,Ua: NEGATIVE
Nitrite: NEGATIVE
Protein, ur: 30 mg/dL — AB
Specific Gravity, Urine: 1.006 (ref 1.005–1.030)
pH: 6 (ref 5.0–8.0)

## 2018-06-21 LAB — COMPREHENSIVE METABOLIC PANEL
ALT: 21 U/L (ref 0–44)
AST: 40 U/L (ref 15–41)
Albumin: 3.4 g/dL — ABNORMAL LOW (ref 3.5–5.0)
Alkaline Phosphatase: 82 U/L (ref 38–126)
Anion gap: 11 (ref 5–15)
BUN: 13 mg/dL (ref 8–23)
CO2: 24 mmol/L (ref 22–32)
Calcium: 8.4 mg/dL — ABNORMAL LOW (ref 8.9–10.3)
Chloride: 98 mmol/L (ref 98–111)
Creatinine, Ser: 1.04 mg/dL — ABNORMAL HIGH (ref 0.44–1.00)
GFR calc Af Amer: >60 mL/min (ref >60)
GFR calc non Af Amer: 54 mL/min — ABNORMAL LOW (ref >60)
Glucose, Bld: 123 mg/dL — ABNORMAL HIGH (ref 70–99)
Potassium: 3.6 mmol/L (ref 3.5–5.1)
Sodium: 133 mmol/L — ABNORMAL LOW (ref 135–145)
Total Bilirubin: 1.1 mg/dL (ref 0.3–1.2)
Total Protein: 8.1 g/dL (ref 6.5–8.1)

## 2018-06-21 LAB — CBC WITH DIFFERENTIAL/PLATELET
Abs Immature Granulocytes: 0.03 10*3/uL (ref 0.00–0.07)
Basophils Absolute: 0 10*3/uL (ref 0.0–0.1)
Basophils Relative: 1 %
Eosinophils Absolute: 0.1 10*3/uL (ref 0.0–0.5)
Eosinophils Relative: 1 %
HCT: 47.2 % — ABNORMAL HIGH (ref 36.0–46.0)
Hemoglobin: 14.8 g/dL (ref 12.0–15.0)
Immature Granulocytes: 1 %
Lymphocytes Relative: 28 %
Lymphs Abs: 1.5 10*3/uL (ref 0.7–4.0)
MCH: 28 pg (ref 26.0–34.0)
MCHC: 31.4 g/dL (ref 30.0–36.0)
MCV: 89.2 fL (ref 80.0–100.0)
Monocytes Absolute: 0.8 10*3/uL (ref 0.1–1.0)
Monocytes Relative: 14 %
Neutro Abs: 3.1 10*3/uL (ref 1.7–7.7)
Neutrophils Relative %: 55 %
Platelets: 176 10*3/uL (ref 150–400)
RBC: 5.29 MIL/uL — ABNORMAL HIGH (ref 3.87–5.11)
RDW: 13.7 % (ref 11.5–15.5)
WBC: 5.5 10*3/uL (ref 4.0–10.5)
nRBC: 0 % (ref 0.0–0.2)

## 2018-06-21 LAB — INFLUENZA PANEL BY PCR (TYPE A & B)
Influenza A By PCR: POSITIVE — AB
Influenza B By PCR: NEGATIVE

## 2018-06-21 LAB — LACTIC ACID, PLASMA: Lactic Acid, Venous: 1.9 mmol/L (ref 0.5–1.9)

## 2018-06-21 MED ORDER — SODIUM CHLORIDE 0.9 % IV BOLUS: 500.0000 mL | Freq: Once | INTRAVENOUS | Status: DC

## 2018-06-21 MED ORDER — SODIUM CHLORIDE 0.9 % IV BOLUS
500.0000 mL | Freq: Once | INTRAVENOUS | Status: AC
  Administered 2018-06-21: 500 mL via INTRAVENOUS

## 2018-06-21 NOTE — ED Triage Notes (Addendum)
Pt reports nausea and sweating for the last four days.  Yesterday she started feeling dizzy and today she had diarrhea. Pt reports a fever of 101.7 at home. She also reports nasal drainage and a cough.  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

## 2018-06-21 NOTE — ED Notes (Signed)
Bedside toilet in room with pt

## 2018-06-21 NOTE — Discharge Instructions (Signed)
Because of the atrial fibrillation and low blood pressure you need to be seen in the ER

## 2018-06-21 NOTE — ED Provider Notes (Signed)
[place]    CSN: [id number] Arrival date & time: 06/21/18  1015     History   Chief Complaint Chief Complaint  Patient presents with  . Dizziness  . Nausea  . Diarrhea  . Excessive Sweating    HPI [name] is a 73 y.o. female.   HPI  Patient with known intermittent atrial fibrillation.  Takes Cardizem as needed.  She has been caring for great grandchild at home.  The child tested positive for influenza yesterday.  She states she has been sick since Friday.  She has had some coughing and chest congestion, fever to 101.7.  Fever chills.  Weakness.  Shortness of breath.  States she feels very tired.  She states she feels like she is having spells of rapid heartbeat.  When she stands up she feels like she is going to pass out.  He has not had Cardizem in over a month.  She feels nausea but no vomiting or diarrhea.  She has been eating somewhat but less than usual, she is not drinking a lot of fluids.  Past Medical History:  Diagnosis Date  . Borderline hyperlipidemia   . Borderline hypertension   . Hypothyroid    s/p PTU therapy  . PAF (paroxysmal atrial fibrillation) [facility])     Patient Active Problem List   Diagnosis Date Noted  . Allergic asthma 06/12/2010  . 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 Perennial allergic rhinitis with seasonal variation 05/04/2010  . UNSPECIFIED TACHYCARDIA 12/19/2009  . OBESITY, UNSPECIFIED 09/16/2008  . HYPOTHYROIDISM 09/15/2008  . DYSLIPIDEMIA 09/15/2008  . Essential hypertension 09/15/2008  . ATRIAL FIBRILLATION, PAROXYSMAL 09/15/2008    Past Surgical History:  Procedure Laterality Date  . CESAREAN SECTION    . CHOLECYSTECTOMY    . KNEE SURGERY     right 2014, left 2015, arthroscipic  . LEG SURGERY     Left leg d/t  tumor (benign)  . THYROID SURGERY      OB History   No obstetric history on file.      Home Medications    Prior to Admission medications   Medication Sig Start Date End  Date Taking? Authorizing Provider  metoprolol tartrate (LOPRESSOR) 50 MG tablet TAKE 1 TABLET BY MOUTH TWICE DAILY 04/09/18  Yes Minus Breeding, MD  omeprazole (PRILOSEC) 10 MG capsule Take 10 mg by mouth daily as needed (acid).    Yes [provider]  warfarin (COUMADIN) 5 MG tablet TAKE 1/2 TO 1 (ONE-HALF TO ONE) TABLET BY MOUTH  DAILY AS DIRECTED BY COUMADIN CLINIC 04/09/18  Yes Minus Breeding, MD  diltiazem (CARDIZEM) 30 MG tablet Take 1 tablet (30 mg total) by mouth 2 (two) times daily as needed (tachycardia). 12/02/17   Almyra Deforest, PA    Family History Family History  Problem Relation Age of Onset  . Lung cancer Father   . Breast cancer Mother        in 38's  . Cirrhosis Brother   . Diabetes Brother   . Breast cancer Sister 2    Social History Social History   Tobacco Use  . Smoking status: Former Smoker    Packs/day: 0.80    Years: 5.00    Pack years: 4.00    Types: Cigarettes    Last attempt to quit: 04/08/1982    Years since quitting: 36.2  . Smokeless tobacco: Never Used  Substance Use Topics  . Alcohol use: No  . Drug use: No  Allergies   Naproxen sodium; Penicillins; and Promethazine hcl   Review of Systems Review of Systems  Constitutional: Positive for chills, diaphoresis, fatigue and fever.  HENT: Positive for congestion and rhinorrhea. Negative for ear pain and sore throat.   Eyes: Negative for pain and visual disturbance.  Respiratory: Positive for cough, chest tightness and shortness of breath.   Cardiovascular: Positive for palpitations. Negative for chest pain and leg swelling.  Gastrointestinal: Negative for abdominal pain and vomiting.  Genitourinary: Negative for dysuria and hematuria.  Musculoskeletal: Positive for myalgias. Negative for arthralgias and back pain.  Skin: Negative for color change and rash.  Neurological: Positive for weakness. Negative for seizures and syncope.  All other systems reviewed and are negative.     Physical Exam Triage Vital Signs ED Triage Vitals  Enc Vitals Group     BP 06/21/18 1033 92/61     Pulse Rate 06/21/18 1033 79     Resp 06/21/18 1033 18     Temp 06/21/18 1033 (!) 97.5 F (36.4 C)     Temp Source 06/21/18 1033 Oral     SpO2 06/21/18 1033 96 %   No data found.  Updated Vital Signs BP 92/61 (BP Location: Right Wrist)   Pulse 79   Temp (!) 97.5 F (36.4 C) (Oral)   Resp 18   SpO2 96%      Physical Exam Constitutional:      Appearance: She is well-developed. She is obese. She is ill-appearing.     Comments: Patient appears tired.  Weak.  Skin is cool and clammy.  HENT:     Head: Normocephalic and atraumatic.     Mouth/Throat:     Mouth: Mucous membranes are dry.  Eyes:     Conjunctiva/sclera: Conjunctivae normal.     Pupils: Pupils are equal, round, and reactive to light.  Neck:     Musculoskeletal: Normal range of motion.  Cardiovascular:     Rate and Rhythm: Tachycardia present.  Pulmonary:     Effort: Pulmonary effort is normal. No respiratory distress.     Breath sounds: Rales present. No wheezing.  Abdominal:     General: There is no distension.     Palpations: Abdomen is soft.  Musculoskeletal: Normal range of motion.  Lymphadenopathy:     Cervical: No cervical adenopathy.  Skin:    General: Skin is warm and dry.  Neurological:     Mental Status: She is alert.  Psychiatric:        Mood and Affect: Mood normal.        Behavior: Behavior normal.      UC Treatments / Results  Labs (all labs ordered are listed, but only abnormal results are displayed) Labs Reviewed - No data to display  EKG None  Radiology No results found.  Procedures Procedures (including critical care time)  Medications Ordered in UC Medications - No data to display  Initial Impression / Assessment and Plan / UC Course  I have reviewed the triage vital signs and the nursing notes.  Pertinent labs & imaging results that were available during my care of  the patient were reviewed by me and considered in my medical decision making (see chart for details).     Brief evaluation of patient found her to be in rapid ventricular rate atrial fibrillation with hypotension, symptomatic.  I believe she belongs in the emergency room for care.  Patient initially objected, stating that she would rather stay in urgent care.  I explained  to her that we were unable to manage her heart condition. Final Clinical Impressions(s) / UC Diagnoses   Final diagnoses:  Hypotension, unspecified hypotension type  Atrial fibrillation with [facility])     Discharge Instructions     Because of the atrial fibrillation and low blood pressure you need to be seen in the ER   ED Prescriptions    None     Controlled Substance Prescriptions [place] Controlled Substance Registry consulted? Not Applicable   Raylene Everts, MD 06/21/18 1553

## 2018-06-21 NOTE — ED Triage Notes (Signed)
Onset 4 days "not feeling well, feeling tired". Onset last night pt started becoming dizzy when standing.  Onset this morning diarrhea x 1, loose.  C/o nasal drainage, causing cough, then spitting up yellow phlegm.   Pt cares for grandchildren and one of the children was dx with flu yesterday.   Bottom lip noted to be "bluish".  This nurse asked pt about this and she stated "has been like this for a while and other people have noticed, I've been meaning to ask the doctor about it'.  No respiratory difficulties, talking In complete sentences.

## 2018-06-21 NOTE — ED Provider Notes (Signed)
[place] EMERGENCY DEPARTMENT Provider Note   CSN: [id number] Arrival date & time: 06/21/18  1129    History   Chief Complaint Chief Complaint  Patient presents with  . Cough  . Dizziness    HPI [name] is a 73 y.o. female.     HPI Patient states she is had 4 days of fever, chills, diaphoresis, generalized myalgias and fatigue.  She endorses cough which is nonproductive.  States she has had 1 day of loose stools.  States she takes care of her 4 grandchildren one who was recently diagnosed with the flu.  She denies any new rashes.  No abdominal pain.  Denies urinary symptoms.  Patient denies chest pain.  She has had lightheadedness especially with standing.  No recent travel or contacts with known covid Past Medical History:  Diagnosis Date  . Borderline hyperlipidemia   . Borderline hypertension   . Hypertension   . Hypothyroid    s/p PTU therapy  . PAF (paroxysmal atrial fibrillation) [facility])     Patient Active Problem List   Diagnosis Date Noted  . Allergic asthma 06/12/2010  . 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 Perennial allergic rhinitis with seasonal variation 05/04/2010  . UNSPECIFIED TACHYCARDIA 12/19/2009  . OBESITY, UNSPECIFIED 09/16/2008  . HYPOTHYROIDISM 09/15/2008  . DYSLIPIDEMIA 09/15/2008  . Essential hypertension 09/15/2008  . ATRIAL FIBRILLATION, PAROXYSMAL 09/15/2008    Past Surgical History:  Procedure Laterality Date  . CESAREAN SECTION    . CHOLECYSTECTOMY    . KNEE SURGERY     right 2014, left 2015, arthroscipic  . LEG SURGERY     Left leg d/t  tumor (benign)  . THYROID SURGERY       OB History   No obstetric history on file.      Home Medications    Prior to Admission medications   Medication Sig Start Date End Date Taking? Authorizing Provider  diltiazem (CARDIZEM) 30 MG tablet Take 1 tablet (30 mg total) by mouth 2 (two) times daily as needed (tachycardia). 12/02/17   Almyra Deforest,  PA  metoprolol tartrate (LOPRESSOR) 50 MG tablet TAKE 1 TABLET BY MOUTH TWICE DAILY 04/09/18   Minus Breeding, MD  omeprazole (PRILOSEC) 10 MG capsule Take 10 mg by mouth daily as needed (acid).     [provider]  warfarin (COUMADIN) 5 MG tablet TAKE 1/2 TO 1 (ONE-HALF TO ONE) TABLET BY MOUTH  DAILY AS DIRECTED BY COUMADIN CLINIC 04/09/18   Minus Breeding, MD    Family History Family History  Problem Relation Age of Onset  . Lung cancer Father   . Breast cancer Mother        in 67's  . Cirrhosis Brother   . Diabetes Brother   . Breast cancer Sister 51    Social History Social History   Tobacco Use  . Smoking status: Former Smoker    Packs/day: 0.80    Years: 5.00    Pack years: 4.00    Types: Cigarettes    Last attempt to quit: 04/08/1982    Years since quitting: 36.2  . Smokeless tobacco: Never Used  Substance Use Topics  . Alcohol use: No  . Drug use: No     Allergies   Naproxen sodium; Penicillins; and Promethazine hcl   Review of Systems Review of Systems  Constitutional: Positive for appetite change, diaphoresis, fatigue and fever.  HENT: Negative for congestion and sore throat.   Eyes: Negative for visual  disturbance.  Respiratory: Positive for cough. Negative for shortness of breath.   Cardiovascular: Negative for chest pain, palpitations and leg swelling.  Gastrointestinal: Positive for diarrhea and nausea. Negative for abdominal pain, constipation and vomiting.  Genitourinary: Negative for flank pain, frequency and hematuria.  Musculoskeletal: Positive for myalgias. Negative for back pain, neck pain and neck stiffness.  Skin: Negative for rash and wound.  Neurological: Positive for light-headedness. Negative for syncope, weakness, numbness and headaches.  All other systems reviewed and are negative.    Physical Exam Updated Vital Signs BP 124/70   Pulse 69   Temp (!) 97.4 F (36.3 C) (Oral)   Resp 18   SpO2 98%   Physical Exam Vitals  signs and nursing note reviewed.  Constitutional:      General: She is not in acute distress.    Appearance: Normal appearance. She is well-developed. She is not ill-appearing.  HENT:     Head: Normocephalic and atraumatic.     Nose: Nose normal.     Mouth/Throat:     Mouth: Mucous membranes are moist.     Pharynx: No oropharyngeal exudate or posterior oropharyngeal erythema.  Eyes:     Extraocular Movements: Extraocular movements intact.     Conjunctiva/sclera: Conjunctivae normal.     Pupils: Pupils are equal, round, and reactive to light.  Neck:     Musculoskeletal: Normal range of motion and neck supple. No neck rigidity or muscular tenderness.  Cardiovascular:     Rate and Rhythm: Normal rate. Rhythm irregular.     Heart sounds: No murmur. No friction rub. No gallop.   Pulmonary:     Effort: Pulmonary effort is normal. No respiratory distress.     Breath sounds: Normal breath sounds. No stridor. No wheezing, rhonchi or rales.  Chest:     Chest wall: No tenderness.  Abdominal:     General: Bowel sounds are normal. There is no distension.     Palpations: Abdomen is soft.     Tenderness: There is no abdominal tenderness. There is no right CVA tenderness, left CVA tenderness, guarding or rebound.  Musculoskeletal: Normal range of motion.        General: No swelling, tenderness, deformity or signs of injury.     Right lower leg: No edema.     Left lower leg: No edema.     Comments: No midline thoracic or lumbar tenderness.  No CVA tenderness.  Lymphadenopathy:     Cervical: No cervical adenopathy.  Skin:    General: Skin is warm and dry.     Capillary Refill: Capillary refill takes less than 2 seconds.     Findings: No erythema or rash.  Neurological:     General: No focal deficit present.     Mental Status: She is alert and oriented to person, place, and time.  Psychiatric:        Behavior: Behavior normal.      ED Treatments / Results  Labs (all labs ordered are  listed, but only abnormal results are displayed) Labs Reviewed  CBC WITH DIFFERENTIAL/PLATELET - Abnormal; Notable for the following components:      Result Value   RBC 5.29 (*)    HCT 47.2 (*)    All other components within normal limits  COMPREHENSIVE METABOLIC PANEL - Abnormal; Notable for the following components:   Sodium 133 (*)    Glucose, Bld 123 (*)    Creatinine, Ser 1.04 (*)    Calcium 8.4 (*)  Albumin 3.4 (*)    GFR calc non Af Amer 54 (*)    All other components within normal limits  INFLUENZA PANEL BY PCR (TYPE A & B) - Abnormal; Notable for the following components:   Influenza A By PCR POSITIVE (*)    All other components within normal limits  CULTURE, BLOOD (ROUTINE X 2)  CULTURE, BLOOD (ROUTINE X 2)  LACTIC ACID, PLASMA  URINALYSIS, ROUTINE W REFLEX MICROSCOPIC    EKG None  Radiology Dg Chest 2 View  Result Date: 06/21/2018 CLINICAL DATA:  Atrial fibrillation EXAM: CHEST - 2 VIEW COMPARISON:  11/26/2016 FINDINGS: The cardiac silhouette, mediastinal and hilar contours are within normal limits given the AP projection. The lungs are clear. No pleural effusion or worrisome pulmonary lesions. The bony thorax is intact. IMPRESSION: No acute cardiopulmonary findings. Electronically Signed   By: Marijo Sanes M.D.   On: 06/21/2018 13:01    Procedures Procedures (including critical care time)  Medications Ordered in ED Medications  sodium chloride 0.9 % bolus 500 mL (has no administration in time range)  sodium chloride 0.9 % bolus 500 mL (500 mLs Intravenous New Bag/Given 06/21/18 1343)     Initial Impression / Assessment and Plan / ED Course  I have reviewed the triage vital signs and the nursing notes.  Pertinent labs & imaging results that were available during my care of the patient were reviewed by me and considered in my medical decision making (see chart for details).       Influenza a positive.  No evidence of sepsis.  Patient does have mild  elevation in creatinine.  Suspect some degree of dehydration.  States she is feeling much better after IV fluids.  Denies lightheadedness with standing.  Will discharge home and advised to follow-up with her primary physician.  Return precautions given.   Final Clinical Impressions(s) / ED Diagnoses   Final diagnoses:  Influenza A  Dehydration    ED Discharge Orders    None       Julianne Rice, MD 06/21/18 [phone]

## 2018-06-23 ENCOUNTER — Telehealth: Payer: Self-pay

## 2018-06-23 NOTE — Telephone Encounter (Signed)
rs coumadinappt covid

## 2018-06-26 LAB — CULTURE, BLOOD (ROUTINE X 2)
Culture: NO GROWTH
Culture: NO GROWTH
Special Requests: ADEQUATE

## 2018-07-17 ENCOUNTER — Telehealth: Payer: Self-pay

## 2018-07-17 NOTE — Telephone Encounter (Signed)

## 2018-07-17 NOTE — Telephone Encounter (Signed)
lmom for prescreen/drive thru aware 

## 2018-07-20 ENCOUNTER — Other Ambulatory Visit: Payer: Self-pay

## 2018-07-20 ENCOUNTER — Ambulatory Visit (INDEPENDENT_AMBULATORY_CARE_PROVIDER_SITE_OTHER): Payer: Medicare Other | Admitting: *Deleted

## 2018-07-20 DIAGNOSIS — I480 Paroxysmal atrial fibrillation: Secondary | ICD-10-CM | POA: Diagnosis not present

## 2018-07-20 DIAGNOSIS — Z5181 Encounter for therapeutic drug level monitoring: Secondary | ICD-10-CM | POA: Diagnosis not present

## 2018-07-20 LAB — POCT INR: INR: 2.5 (ref 2.0–3.0)

## 2018-07-20 NOTE — Patient Instructions (Signed)
Description   Spoke with pt and instructed to continue with 1 tablet daily except 1/2 tablet each Monday, Wednesday and Friday. Repeat INR in 7 weeks (unable to do 8 wks at this time she bc will have grandkids at that time).

## 2018-09-04 ENCOUNTER — Telehealth: Payer: Self-pay

## 2018-09-04 NOTE — Telephone Encounter (Signed)
lmom for prescreen  

## 2018-09-04 NOTE — Telephone Encounter (Signed)

## 2018-09-07 ENCOUNTER — Other Ambulatory Visit: Payer: Self-pay

## 2018-09-07 ENCOUNTER — Ambulatory Visit (INDEPENDENT_AMBULATORY_CARE_PROVIDER_SITE_OTHER): Payer: Medicare Other | Admitting: *Deleted

## 2018-09-07 DIAGNOSIS — I480 Paroxysmal atrial fibrillation: Secondary | ICD-10-CM

## 2018-09-07 DIAGNOSIS — Z5181 Encounter for therapeutic drug level monitoring: Secondary | ICD-10-CM

## 2018-09-07 LAB — POCT INR: INR: 2.1 (ref 2.0–3.0)

## 2018-09-07 NOTE — Patient Instructions (Signed)
Description   Continue with 1 tablet daily except 1/2 tablet each Monday, Wednesday and Friday. Repeat INR in 7 weeks.

## 2018-10-12 ENCOUNTER — Telehealth: Payer: Self-pay

## 2018-10-12 NOTE — Telephone Encounter (Signed)

## 2018-10-19 ENCOUNTER — Ambulatory Visit (INDEPENDENT_AMBULATORY_CARE_PROVIDER_SITE_OTHER): Payer: Medicare Other | Admitting: *Deleted

## 2018-10-19 ENCOUNTER — Other Ambulatory Visit: Payer: Self-pay

## 2018-10-19 DIAGNOSIS — Z5181 Encounter for therapeutic drug level monitoring: Secondary | ICD-10-CM | POA: Diagnosis not present

## 2018-10-19 DIAGNOSIS — I480 Paroxysmal atrial fibrillation: Secondary | ICD-10-CM | POA: Diagnosis not present

## 2018-10-19 LAB — POCT INR: INR: 2.4 (ref 2.0–3.0)

## 2018-10-19 NOTE — Patient Instructions (Signed)
Description   Continue with 1 tablet daily except 1/2 tablet each Monday, Wednesday and Friday. Repeat INR in 7-8 weeks.

## 2018-11-20 ENCOUNTER — Other Ambulatory Visit: Payer: Self-pay

## 2018-11-20 ENCOUNTER — Encounter: Payer: Self-pay | Admitting: Physician Assistant

## 2018-11-20 ENCOUNTER — Ambulatory Visit (INDEPENDENT_AMBULATORY_CARE_PROVIDER_SITE_OTHER): Payer: Medicare Other | Admitting: General Practice

## 2018-11-20 VITALS — BP 132/74 | HR 51 | Ht 60.0 in | Wt 229.2 lb

## 2018-11-20 DIAGNOSIS — I10 Essential (primary) hypertension: Secondary | ICD-10-CM

## 2018-11-20 DIAGNOSIS — I480 Paroxysmal atrial fibrillation: Secondary | ICD-10-CM

## 2018-11-20 DIAGNOSIS — E039 Hypothyroidism, unspecified: Secondary | ICD-10-CM

## 2018-11-20 DIAGNOSIS — E785 Hyperlipidemia, unspecified: Secondary | ICD-10-CM

## 2018-11-20 MED ORDER — DILTIAZEM HCL 30 MG PO TABS: 30.0000 mg | ORAL_TABLET | Freq: Two times a day (BID) | ORAL | 4 refills | Status: DC | PRN

## 2018-11-20 MED ORDER — WARFARIN SODIUM 5 MG PO TABS: ORAL_TABLET | ORAL | 0 refills | Status: DC

## 2018-11-20 MED ORDER — METOPROLOL TARTRATE 50 MG PO TABS: 50.0000 mg | ORAL_TABLET | Freq: Two times a day (BID) | ORAL | 3 refills | Status: DC

## 2018-11-20 NOTE — Progress Notes (Signed)
Cardiology Clinic Note   Patient Name: [name] Date of Encounter: 11/20/2018  Primary Care Provider:  Kelton Pillar, MD Primary Cardiologist:  Minus Breeding, MD  Patient Profile    [name] 73 year old female presents today for follow-up for her atrial fibrillation.  Past Medical History    Past Medical History:  Diagnosis Date  . Borderline hyperlipidemia   . Borderline hypertension   . Hypertension   . Hypothyroid    s/p PTU therapy  . PAF (paroxysmal atrial fibrillation) ([place])    Past Surgical History:  Procedure Laterality Date  . CESAREAN SECTION    . CHOLECYSTECTOMY    . KNEE SURGERY     right 2014, left 2015, arthroscipic  . LEG SURGERY     Left leg d/t  tumor (benign)  . THYROID SURGERY      Allergies  Allergies  Allergen Reactions  . Naproxen Sodium Other (See Comments)    Hurt all over  . Penicillins Rash    Has patient had a PCN reaction causing immediate rash, facial/tongue/throat swelling, SOB or lightheadedness with hypotension: Yes Has patient had a PCN reaction causing severe rash involving mucus membranes or skin necrosis: Yes Has patient had a PCN reaction that required hospitalization: No Has patient had a PCN reaction occurring within the last 10 years: No If all of the above answers are "NO", then may proceed with Cephalosporin use.   . Promethazine Hcl Other (See Comments)    Sick     History of Present Illness  She was last seen by Dr. Percival Spanish on 06/01/2018.  During that time she was feeling okay and noted that occasionally she has palpitations.  She indicated that she had had 3 episodes of palpitations that month (February) the longest episode lasting about 5 hours.  She also noted that palpitations usually happen at night when she was getting ready to go to bed.  She denied symptoms of presyncope or syncope.  The episodes were not limiting her daily activities and she continued to take care of 32-year-old twins.  Her  PMH also includes essential hypertension, paroxysmal atrial fibrillation, allergic asthma, hypothyroidism, dyslipidemia, obesity, and toxic effect of fish and shellfish.  She presents to the clinic today and states she is only had 2 episodes of atrial fibrillation in the last 6 months.  She states that the episodes are not lasting as long and only about 20 to 30 minutes at a time.  She does notice some chest discomfort when she converts back to normal sinus rhythm but states that when she feels palpitations she has no chest discomfort.  She continues to watch her grandkids consisting of a 48-year-old and a set of 75-year-old twins.  She states it is hard for her to be physically active due to bilateral knee pain.  She also feels like she has had some lower extremity swelling that she notices increasing through the day and is gone each morning when she awakens.  She  denies chest pain, shortness of breath,fatigue, palpitations, melena, hematuria, hemoptysis, diaphoresis, weakness, presyncope, syncope, orthopnea, and PND.   Home Medications    Prior to Admission medications   Medication Sig Start Date End Date Taking? Authorizing Provider  diltiazem (CARDIZEM) 30 MG tablet Take 1 tablet (30 mg total) by mouth 2 (two) times daily as needed (tachycardia). 12/02/17   Almyra Deforest, PA  metoprolol tartrate (LOPRESSOR) 50 MG tablet TAKE 1 TABLET BY MOUTH TWICE DAILY 04/09/18   Minus Breeding, MD  omeprazole (  PRILOSEC) 10 MG capsule Take 10 mg by mouth daily as needed (acid).     [provider]  warfarin (COUMADIN) 5 MG tablet TAKE 1/2 TO 1 (ONE-HALF TO ONE) TABLET BY MOUTH  DAILY AS DIRECTED BY COUMADIN CLINIC 04/09/18   Minus Breeding, MD    Family History    Family History  Problem Relation Age of Onset  . Lung cancer Father   . Breast cancer Mother        in 3's  . Cirrhosis Brother   . Diabetes Brother   . Breast cancer Sister 46   She indicated that her mother is alive. She indicated that  her father is deceased. She indicated that only one of her two sisters is alive. She indicated that only one of her four brothers is alive.  Social History    Social History   Socioeconomic History  . Marital status: Married    Spouse name: Not on file  . Number of children: Not on file  . Years of education: Not on file  . Highest education level: Not on file  Occupational History  . Not on file  Social Needs  . Financial resource strain: Not on file  . Food insecurity    Worry: Not on file    Inability: Not on file  . Transportation needs    Medical: Not on file    Non-medical: Not on file  Tobacco Use  . Smoking status: Former Smoker    Packs/day: 0.80    Years: 5.00    Pack years: 4.00    Types: Cigarettes    Quit date: 04/08/1982    Years since quitting: 36.6  . Smokeless tobacco: Never Used  Substance and Sexual Activity  . Alcohol use: No  . Drug use: No  . Sexual activity: Not on file  Lifestyle  . Physical activity    Days per week: Not on file    Minutes per session: Not on file  . Stress: Not on file  Relationships  . Social Herbalist on phone: Not on file    Gets together: Not on file    Attends religious service: Not on file    Active member of club or organization: Not on file    Attends meetings of clubs or organizations: Not on file    Relationship status: Not on file  . Intimate partner violence    Fear of current or ex partner: Not on file    Emotionally abused: Not on file    Physically abused: Not on file    Forced sexual activity: Not on file  Other Topics Concern  . Not on file  Social History Narrative  . Not on file     Review of Systems    General:  No chills, fever, night sweats or weight changes.  Cardiovascular:  No chest pain, dyspnea on exertion, edema, orthopnea, palpitations, paroxysmal nocturnal dyspnea. Dermatological: No rash, lesions/masses Respiratory: No cough, dyspnea Urologic: No hematuria, dysuria  Abdominal:   No nausea, vomiting, diarrhea, bright red blood per rectum, melena, or hematemesis Neurologic:  No visual changes, wkns, changes in mental status. All other systems reviewed and are otherwise negative except as noted above.  Physical Exam    VS:  BP 132/74 (BP Location: Right Arm, Patient Position: Sitting, Cuff Size: Large)   Pulse (!) 51   Ht 5' (1.524 m)   Wt 229 lb 3.2 oz (104 kg)   SpO2 100%  BMI 44.76 kg/m  , BMI Body mass index is 44.76 kg/m. GEN: Well nourished, well developed, in no acute distress. HEENT: normal. Neck: Supple, no JVD, carotid bruits, or masses. Cardiac: RRR, no murmurs, rubs, or gallops. No clubbing, cyanosis, generalized bilateral LE nonpitting edema.  Radials/DP/PT 2+ and equal bilaterally.  Respiratory:  Respirations regular and unlabored, clear to auscultation bilaterally. GI: Soft, nontender, nondistended, BS + x 4. MS: no deformity or atrophy. Skin: warm and dry, no rash. Neuro:  Strength and sensation are intact. Psych: Normal affect.  Accessory Clinical Findings    ECG personally reviewed by me today-sinus bradycardia with premature atrial complexes 51 bpm  EKG 06/23/2018: Atrial fibrillation 98 bpm  EKG 06/21/2018: Atrial fibrillation with a rapid ventricular response 106 bpm  EKG 12/02/2017: Sinus bradycardia 47 bpm  Echocardiogram 04/08/2016: Study Conclusions  - Left ventricle: The cavity size was normal. Wall thickness was   normal. Systolic function was normal. The estimated ejection   fraction was in the range of 60% to 65%. Wall motion was normal;   there were no regional wall motion abnormalities. Features are   consistent with a pseudonormal left ventricular filling pattern,   with concomitant abnormal relaxation and increased filling   pressure (grade 2 diastolic dysfunction).  Assessment & Plan   1.  Paroxysmal atrial fibrillation- EKG today shows sinus bradycardia with premature atrial complexes 51 bpm.  Continue diltiazem 30 mg tablet twice daily Continue metoprolol tartrate 50 mg tablet twice daily Continue warfarin half to 1 tablet by mouth daily as directed by Coumadin clinic  2.  Essential hypertension- well-controlled, BP today Heart healthy low-sodium diet Increase physical activity as tolerated  3.  Hyperlipidemia-LDL 87 (05/27/2008) High-fiber low-sodium heart healthy diet Increase physical activity as tolerated Monitored by PCP  4.  Hypothyroidism-TSH 2.353 11/26/2016 Monitored by PCP  Disposition: Follow-up with Dr. Percival Spanish in 6 months.  Deberah Pelton, NP 11/20/2018, 9:02 AM

## 2018-11-20 NOTE — Patient Instructions (Signed)
Medication Instructions:  Your physician recommends that you continue on your current medications as directed. Please refer to the Current Medication list given to you today.  If you need a refill on your cardiac medications before your next appointment, please call your pharmacy.   Lab work: NONE ordered at this time of appointment   If you have labs (blood work) drawn today and your tests are completely normal, you will receive your results only by: Marland Kitchen MyChart Message (if you have MyChart) OR . A paper copy in the mail If you have any lab test that is abnormal or we need to change your treatment, we will call you to review the results.  Testing/Procedures: NONE ordered at this time of appointment   Follow-Up: At [facility], you and your health needs are our priority.  As part of our continuing mission to provide you with exceptional heart care, we have created designated Provider Care Teams.  These Care Teams include your primary Cardiologist (physician) and Advanced Practice Providers (APPs -  Physician Assistants and Nurse Practitioners) who all work together to provide you with the care you need, when you need it. You will need a follow up appointment in 6 months.  Please call our office 2 months in advance to schedule this appointment.  You may see Minus Breeding, MD or one of the following Advanced Practice Providers on your designated Care Team:   Rosaria Ferries, PA-C . Jory Sims, DNP, ANP  Any Other Special Instructions Will Be Listed Below (If Applicable).

## 2018-12-10 ENCOUNTER — Ambulatory Visit (INDEPENDENT_AMBULATORY_CARE_PROVIDER_SITE_OTHER): Payer: Medicare Other | Admitting: Pharmacist Clinician (PhC)/ Clinical Pharmacy Specialist

## 2018-12-10 ENCOUNTER — Other Ambulatory Visit: Payer: Self-pay

## 2018-12-10 DIAGNOSIS — Z7901 Long term (current) use of anticoagulants: Secondary | ICD-10-CM

## 2018-12-10 DIAGNOSIS — I480 Paroxysmal atrial fibrillation: Secondary | ICD-10-CM | POA: Diagnosis not present

## 2018-12-10 LAB — POCT INR: INR: 2.6 (ref 2.0–3.0)

## 2018-12-31 ENCOUNTER — Telehealth: Payer: Self-pay | Admitting: Cardiology

## 2018-12-31 NOTE — Telephone Encounter (Signed)
Spoke with patient - she continues to have heart flip flop - speeding up and down]  patient states he has been taking @8  am metoprolol 50 mg twice a day  On Tuesday she did take extra dose of metoptolol Last 3 days taken diltiazem 30 mg  @7 :30 am  And again in the evening.   blood pressure now 131/81 ht 99 patient states she feels heart flipping   Especially if she lays down .   RN informed patient to take diltiazem 30 mg now  Speak to  Provider who saw her last. And contact her back

## 2018-12-31 NOTE — Telephone Encounter (Signed)
Noe Gens NP -  Reviewed information  Per  Order increase metoprolol dose in the evening to 100 mg ,continue with 50 mg in the morning  continue prn diltiazem 30 mg twice a day   Patient made aware and verbalized understanding  Appointment will be schedule @ month with  Evelena Peat or Dr Percival Spanish as requested  Patient aware will contact her with appointment

## 2018-12-31 NOTE — Telephone Encounter (Signed)
  Patient is c/o her heart speeding up and slowing down for the past three days. She has had a bit of nausea but it went away. She states she has had slight chest pain off and on for the last three days. She has taken her medications as directed and an extra half on the metoprolol the other day.

## 2019-01-04 NOTE — Telephone Encounter (Signed)
Spoke with patient - appointment schedule 01/20/19 at 9:45 am Patient states flip flopping has stopped

## 2019-01-15 ENCOUNTER — Telehealth: Payer: Self-pay | Admitting: Adult Health

## 2019-01-15 NOTE — Telephone Encounter (Signed)
Spoke with patient and she is not able to ride in elevators by herself and would not be coming without her husband, she would have to reschedule. Advised ok for husband to come and wait in the hall so he can ride down with her.

## 2019-01-15 NOTE — Telephone Encounter (Signed)
  Patient has appt on 01/20/19 with Arnold Long and she wants her husband to come with her.

## 2019-01-19 NOTE — Progress Notes (Signed)
Cardiology Office Note   Date:  01/20/2019   ID:  [name], DOB 10-30-1945, MRN [id number]  Soft change Plavix 75 PCP:  Kelton Pillar, MD  Cardiologist:   Dr. Percival Spanish   History of Present Illness: [name] is a 73 y.o. female who presents for follow-up for her paroxysmal atrial fibrillation.  Her past medical history includes essential hypertension, allergic asthma, hypothyroidism, dyslipidemia, obesity, and toxic effect of fish and shellfish.  She was last seen by me on 11/20/2018.  During that time she was doing well and had noticed 2 episodes of atrial fibrillation in the prior 6 months.  She stated that the episodes of atrial fibrillation are not lasting as long however she did notice some chest discomfort as she converted back to normal sinus rhythm.  She was finding it hard to increase her physical activity due to bilateral knee pain and caring for her grandchildren.  Her medication regimen was continued unchanged.  She presents the clinic today and states that her atrial fibrillation started on 12/31/2018 lasted for about 6 days before she converted to sinus rhythm.  During her bout of PAF she took 50 mg of metoprolol in the morning, 100 mg of metoprolol in the evening and diltiazem 30 mg twice daily.  She has now resumed her normal medication regimen of 50 mg of metoprolol twice daily.  She notes that she can feel when she is in atrial fibrillation and has some mild chest discomfort when she converts back to sinus rhythm.  She states that since she converted back to sinus rhythm she has not had any further episodes of flutter or atrial fibrillation, skipped beats or palpitations.  She denies chest pain, shortness of breath, lower extremity edema, fatigue, palpitations, melena, hematuria, hemoptysis, diaphoresis, weakness, presyncope, syncope, orthopnea, and PND.     Past Medical History:  Diagnosis Date  . Borderline hyperlipidemia   . Borderline hypertension   .  Hypertension   . Hypothyroid    s/p PTU therapy  . PAF (paroxysmal atrial fibrillation) ([place])     Past Surgical History:  Procedure Laterality Date  . CESAREAN SECTION    . CHOLECYSTECTOMY    . KNEE SURGERY     right 2014, left 2015, arthroscipic  . LEG SURGERY     Left leg d/t  tumor (benign)  . THYROID SURGERY       Current Outpatient Medications  Medication Sig Dispense Refill  . diltiazem (CARDIZEM) 30 MG tablet Take 1 tablet (30 mg total) by mouth 2 (two) times daily as needed (tachycardia). 60 tablet 4  . metoprolol tartrate (LOPRESSOR) 50 MG tablet Take 1 tablet (50 mg total) by mouth 2 (two) times daily. TAKE 1 TABLET BY MOUTH TWICE DAILY 180 tablet 3  . omeprazole (PRILOSEC) 10 MG capsule Take 10 mg by mouth daily as needed (acid).     . warfarin (COUMADIN) 5 MG tablet TAKE 1/2 TO 1 (ONE-HALF TO ONE) TABLET BY MOUTH  DAILY AS DIRECTED BY COUMADIN CLINIC 90 tablet 0   No current facility-administered medications for this visit.     Allergies:   Naproxen sodium, Penicillins, and Promethazine hcl    Social History:  The patient  reports that she quit smoking about 36 years ago. Her smoking use included cigarettes. She has a 4.00 pack-year smoking history. She has never used smokeless tobacco. She reports that she does not drink alcohol or use drugs.   Family History:  The patient's family history includes Breast  cancer in her mother; Breast cancer (age of onset: 59) in her sister; Cirrhosis in her brother; Diabetes in her brother; Lung cancer in her father.    ROS: All other systems are reviewed and negative. Unless otherwise mentioned in H&P    PHYSICAL EXAM: VS:  BP 112/74   Pulse (!) 50   Temp (!) 97.2 F (36.2 C)   Ht 5' (1.524 m)   Wt 230 lb (104.3 kg)   SpO2 99%   BMI 44.92 kg/m  , BMI Body mass index is 44.92 kg/m. GEN: Well nourished, well developed, in no acute distress HEENT: normal Neck: no JVD, carotid bruits, or masses Cardiac: Sinus  bradycardia; no murmurs, rubs, or gallops,no edema  Respiratory:  Clear to auscultation bilaterally, normal work of breathing GI: soft, nontender, nondistended, + BS MS: no deformity or atrophy Skin: warm and dry, no rash Neuro:  Strength and sensation are intact Psych: euthymic mood, full affect   EKG:  EKG is ordered today. The ekg ordered today demonstrates sinus bradycardia 52 bpm   Recent Labs: 06/21/2018: ALT 21; BUN 13; Creatinine, Ser 1.04; Hemoglobin 14.8; Platelets 176; Potassium 3.6; Sodium 133    Lipid Panel    Component Value Date/Time   CHOL  05/27/2008 0700    149        ATP III CLASSIFICATION:  <200     mg/dL   Desirable  200-239  mg/dL   Borderline High  >=240    mg/dL   High          TRIG 54 05/27/2008 0700   HDL 51 05/27/2008 0700   CHOLHDL 2.9 05/27/2008 0700   VLDL 11 05/27/2008 0700   LDLCALC  05/27/2008 0700    87        Total Cholesterol/HDL:CHD Risk Coronary Heart Disease Risk Table                     Men   Women  1/2 Average Risk   3.4   3.3  Average Risk       5.0   4.4  2 X Average Risk   9.6   7.1  3 X Average Risk  23.4   11.0        Use the calculated Patient Ratio above and the CHD Risk Table to determine the patient's CHD Risk.        ATP III CLASSIFICATION (LDL):  <100     mg/dL   Optimal  100-129  mg/dL   Near or Above                    Optimal  130-159  mg/dL   Borderline  160-189  mg/dL   High  >190     mg/dL   Very High      Wt Readings from Last 3 Encounters:  01/20/19 230 lb (104.3 kg)  11/20/18 229 lb 3.2 oz (104 kg)  06/01/18 234 lb (106.1 kg)      Other studies Reviewed: Additional studies/ records that were reviewed today include: EKG 11/20/2018  sinus bradycardia with premature atrial complexes 51 bpm  EKG 06/23/2018: Atrial fibrillation 98 bpm  EKG 06/21/2018: Atrial fibrillation with a rapid ventricular response 106 bpm  EKG 12/02/2017: Sinus bradycardia 47 bpm  Echocardiogram 04/08/2016: Study  Conclusions  - Left ventricle: The cavity size was normal. Wall thickness was normal. Systolic function was normal. The estimated ejection fraction was in the range of 60% to 65%.  Wall motion was normal; there were no regional wall motion abnormalities. Features are consistent with a pseudonormal left ventricular filling pattern, with concomitant abnormal relaxation and increased filling pressure (grade 2 diastolic dysfunction).    ASSESSMENT AND PLAN:   1.  Paroxysmal atrial fibrillation-EKG today demonstrates sinus bradycardia 52 bpm Continue diltiazem 30 mg tablet twice daily as needed Continue metoprolol tartrate 50 mg tablet twice daily Continue warfarin half to 1 tablet by mouth daily as directed by Coumadin clinic  Essential hypertension- BP today 112/74.  Well-controlled at home. Continue heart healthy low-sodium diet-salty 6 given Increase physical activity as tolerated Keep blood pressure log and bring to next visit  Hyperlipidemia-LDL 87 05/27/2008 Continue high-fiber low-sodium heart healthy diet Increase physical activity as tolerated Monitored by PCP  Disposition: Follow-up with Dr. Percival Spanish during scheduled February appointment.  Current medicines are reviewed at length with the patient today.    Labs/ tests ordered today include: None today  Coletta Memos NP-C   01/20/2019 10:13 AM    [address] Office [phone] Fax [phone]  Notice: This dictation was prepared with Dragon dictation along with smaller phrase technology. Any transcriptional errors that result from this process are unintentional and may not be corrected upon review.

## 2019-01-20 ENCOUNTER — Encounter: Payer: Self-pay | Admitting: Adult Health

## 2019-01-20 ENCOUNTER — Ambulatory Visit: Payer: Medicare Other | Admitting: General Practice

## 2019-01-20 ENCOUNTER — Other Ambulatory Visit: Payer: Self-pay

## 2019-01-20 VITALS — BP 112/74 | HR 50 | Temp 97.2°F | Ht 60.0 in | Wt 230.0 lb

## 2019-01-20 DIAGNOSIS — I10 Essential (primary) hypertension: Secondary | ICD-10-CM | POA: Diagnosis not present

## 2019-01-20 DIAGNOSIS — E785 Hyperlipidemia, unspecified: Secondary | ICD-10-CM

## 2019-01-20 DIAGNOSIS — I480 Paroxysmal atrial fibrillation: Secondary | ICD-10-CM | POA: Diagnosis not present

## 2019-01-20 NOTE — Patient Instructions (Signed)
Medication Instructions:  Continue current medications  If you need a refill on your cardiac medications before your next appointment, please call your pharmacy.  Labwork: None Ordered .  Testing/Procedures: None Ordered  Follow-Up: You will need a follow up appointment in 4 months.  Please call our office 2 months in advance to schedule this appointment.  You may see James Hochrein, MD or one of the following Advanced Practice Providers on your designated Care Team:   Rhonda Barrett, PA-C . Kathryn Lawrence, DNP, ANP    At [facility], you and your health needs are our priority.  As part of our continuing mission to provide you with exceptional heart care, we have created designated Provider Care Teams.  These Care Teams include your primary Cardiologist (physician) and Advanced Practice Providers (APPs -  Physician Assistants and Nurse Practitioners) who all work together to provide you with the care you need, when you need it.  Thank you for choosing [facility] at [facility]!!     

## 2019-01-27 ENCOUNTER — Other Ambulatory Visit: Payer: Self-pay

## 2019-01-27 ENCOUNTER — Ambulatory Visit (INDEPENDENT_AMBULATORY_CARE_PROVIDER_SITE_OTHER): Payer: Medicare Other | Admitting: Pharmacist

## 2019-01-27 DIAGNOSIS — I480 Paroxysmal atrial fibrillation: Secondary | ICD-10-CM | POA: Diagnosis not present

## 2019-01-27 DIAGNOSIS — Z7901 Long term (current) use of anticoagulants: Secondary | ICD-10-CM | POA: Diagnosis not present

## 2019-01-27 LAB — POCT INR: INR: 2.5 (ref 2.0–3.0)

## 2019-03-02 ENCOUNTER — Other Ambulatory Visit: Payer: Self-pay

## 2019-03-02 DIAGNOSIS — Z20822 Contact with and (suspected) exposure to covid-19: Secondary | ICD-10-CM

## 2019-03-03 LAB — NOVEL CORONAVIRUS, NAA: SARS-CoV-2, NAA: DETECTED — AB

## 2019-03-07 ENCOUNTER — Ambulatory Visit: Payer: Self-pay

## 2019-03-07 NOTE — Telephone Encounter (Signed)
Pt given Covid-19 positive results. Discussed mild, moderate and severe symptoms. Advised pt to call 911 for any respiratory issues and/dehydration. Discussed non test criteria for ending self isolation. Pt advised of way to manage symptoms at home and review isolation precautions especially the importance of washing hands frequently and wearing a mask when around others. Pt verbalized understanding.  

## 2019-03-24 ENCOUNTER — Ambulatory Visit (INDEPENDENT_AMBULATORY_CARE_PROVIDER_SITE_OTHER): Payer: Medicare Other | Admitting: Pharmacist Clinician (PhC)/ Clinical Pharmacy Specialist

## 2019-03-24 ENCOUNTER — Other Ambulatory Visit: Payer: Self-pay

## 2019-03-24 DIAGNOSIS — I480 Paroxysmal atrial fibrillation: Secondary | ICD-10-CM

## 2019-03-24 LAB — POCT INR: INR: 2.5 (ref 2.0–3.0)

## 2019-05-03 ENCOUNTER — Other Ambulatory Visit: Payer: Self-pay | Admitting: Family Medicine

## 2019-05-03 DIAGNOSIS — Z1231 Encounter for screening mammogram for malignant neoplasm of breast: Secondary | ICD-10-CM

## 2019-05-04 DIAGNOSIS — Z7189 Other specified counseling: Secondary | ICD-10-CM | POA: Insufficient documentation

## 2019-05-04 NOTE — Progress Notes (Signed)
Cardiology Office Note   Date:  05/05/2019   ID:  [name], DOB [birth year], MRN [id number]  PCP:  Kelton Pillar, MD  Cardiologist:   Minus Breeding, MD   Chief Complaint  Patient presents with  . Palpitations      History of Present Illness: [name] is a 74 y.o. female who presents for followup of atrial fibrillation.  She was in the ED on 03/22/16 with palpitations.  She was back in the ED with atrial fib in August 2018.  She was seen in our office last in Aug 2018.   She had had relatively few symptoms at that point.  She could not, at that point afford Eliquis and she did not want rhythm management with flecainide.  We could not titrate beta blockers because of bradycardia.   She was in the office in October for follow up.  She had had some prolonged fib.  Since last being seen he has been doing a lot of things and currently has been fibrillation probably for the last 3 or 4 days.  She feels very fatigued.  She has good.  Daily.  She has some atypical sharp chest pains that she thinks happens more after detox back in sinus rhythm mammogram when she had fibrillation.  She is not having any resting chest discomfort, neck discomfort, arm discomfort.  She has no new shortness of breath, PND or orthopnea.  Has had no weight gain or edema.   Past Medical History:  Diagnosis Date  . Borderline hyperlipidemia   . Borderline hypertension   . Hypertension   . Hypothyroid    s/p PTU therapy  . PAF (paroxysmal atrial fibrillation) ([place])     Past Surgical History:  Procedure Laterality Date  . CESAREAN SECTION    . CHOLECYSTECTOMY    . KNEE SURGERY     right 2014, left 2015, arthroscipic  . LEG SURGERY     Left leg d/t  tumor (benign)  . THYROID SURGERY    She would not need to take the Cardizem probably   Current Outpatient Medications  Medication Sig Dispense Refill  . metoprolol tartrate (LOPRESSOR) 50 MG tablet Take 1 tablet (50 mg total) by mouth 2 (two)  times daily. TAKE 1 TABLET BY MOUTH TWICE DAILY 180 tablet 3  . omeprazole (PRILOSEC) 10 MG capsule Take 10 mg by mouth daily as needed (acid).     . warfarin (COUMADIN) 5 MG tablet TAKE 1/2 TO 1 (ONE-HALF TO ONE) TABLET BY MOUTH  DAILY AS DIRECTED BY COUMADIN CLINIC 90 tablet 0  . flecainide (TAMBOCOR) 100 MG tablet Take 1 tablet (100 mg total) by mouth 2 (two) times daily. 180 tablet 3  . flecainide (TAMBOCOR) 50 MG tablet Take 1 tablet (50 mg total) by mouth 2 (two) times daily. Take 50mg  twice a day for 3 days. 6 tablet 0   No current facility-administered medications for this visit.    Allergies:   Naproxen sodium, Penicillins, and Promethazine hcl    ROS:  Please see the history of present illness.   Otherwise, review of systems are positive for none.   All other systems are reviewed and negative.    PHYSICAL EXAM: VS:  BP (!) 144/97   Pulse 100   Temp (!) 97.2 F (36.2 C)   Ht 5' (1.524 m)   Wt 227 lb 6.4 oz (103.1 kg)   SpO2 99%   BMI 44.41 kg/m  , BMI Body mass index  is 44.41 kg/m. GENERAL:  Well appearing NECK:  No jugular venous distention, waveform within normal limits, carotid upstroke brisk and symmetric, no bruits, no thyromegaly LUNGS:  Clear to auscultation bilaterally CHEST:  Unremarkable HEART:  PMI not displaced or sustained,S1 and S2 within normal limits, no S3,  no clicks, no rubs, no murmurs, irregular ABD:  Flat, positive bowel sounds normal in frequency in pitch, no bruits, no rebound, no guarding, no midline pulsatile mass, no hepatomegaly, no splenomegaly EXT:  2 plus pulses throughout, no edema, no cyanosis no clubbing   EKG:  EKG is ordered today. The ekg ordered today demonstrates sinus rhythm, rate 94, axis within normal limits, intervals within normal limits, no acute ST-T wave changes.   Recent Labs: 06/21/2018: ALT 21; BUN 13; Creatinine, Ser 1.04; Hemoglobin 14.8; Platelets 176; Potassium 3.6; Sodium 133    Lipid Panel    Component Value  Date/Time   CHOL  05/27/2008 0700    149        ATP III CLASSIFICATION:  <200     mg/dL   Desirable  200-239  mg/dL   Borderline High  >=240    mg/dL   High          TRIG 54 05/27/2008 0700   HDL 51 05/27/2008 0700   CHOLHDL 2.9 05/27/2008 0700   VLDL 11 05/27/2008 0700   LDLCALC  05/27/2008 0700    87        Total Cholesterol/HDL:CHD Risk Coronary Heart Disease Risk Table                     Men   Women  1/2 Average Risk   3.4   3.3  Average Risk       5.0   4.4  2 X Average Risk   9.6   7.1  3 X Average Risk  23.4   11.0        Use the calculated Patient Ratio above and the CHD Risk Table to determine the patient's CHD Risk.        ATP III CLASSIFICATION (LDL):  <100     mg/dL   Optimal  100-129  mg/dL   Near or Above                    Optimal  130-159  mg/dL   Borderline  160-189  mg/dL   High  >190     mg/dL   Very High      Wt Readings from Last 3 Encounters:  05/05/19 227 lb 6.4 oz (103.1 kg)  01/20/19 230 lb (104.3 kg)  11/20/18 229 lb 3.2 oz (104 kg)      Other studies Reviewed: Additional studies/ records that were reviewed today include: None. Review of the above records demonstrates:  Please see elsewhere in the note.     ASSESSMENT AND PLAN:   Paroxysmal atrial fibrillation:    -She is now having many more and sustained paroxysms of this.  She agrees to start flecainide.  I will start with 50 mg bid and then move to 100 mg twice a day.  We can see her back in 1 month. POET (Plain Old Exercise Treadmill) if it appears that she is going to be continuing this.  She would also troponin I level following day.  She is not using the Cardizem so this can be removed from her list.  Essential hypertension: Her blood pressure is controlled.  No change in therapy.  Covid education: She states she tested positive for Covid late last year though she was never sick.  She does not want to get the vaccine however we talked about this today.   Current medicines  are reviewed at length with the patient today.  The patient has concerns regarding medicines.  The following changes have been made:  As above.   Labs/ tests ordered today include:   Orders Placed This Encounter  Procedures  . EKG 12-Lead     Disposition:   FU with Odie Sera NP in one month.     Signed, Minus Breeding, MD  05/05/2019 9:44 AM    [facility]

## 2019-05-05 ENCOUNTER — Other Ambulatory Visit: Payer: Self-pay

## 2019-05-05 ENCOUNTER — Ambulatory Visit: Payer: Medicare PPO | Admitting: Cardiology

## 2019-05-05 ENCOUNTER — Encounter: Payer: Self-pay | Admitting: Cardiology

## 2019-05-05 VITALS — BP 144/97 | HR 100 | Temp 97.2°F | Ht 60.0 in | Wt 227.4 lb

## 2019-05-05 DIAGNOSIS — E785 Hyperlipidemia, unspecified: Secondary | ICD-10-CM

## 2019-05-05 DIAGNOSIS — I10 Essential (primary) hypertension: Secondary | ICD-10-CM

## 2019-05-05 DIAGNOSIS — I480 Paroxysmal atrial fibrillation: Secondary | ICD-10-CM | POA: Diagnosis not present

## 2019-05-05 DIAGNOSIS — Z7189 Other specified counseling: Secondary | ICD-10-CM | POA: Diagnosis not present

## 2019-05-05 MED ORDER — FLECAINIDE ACETATE 50 MG PO TABS: 50.0000 mg | ORAL_TABLET | Freq: Two times a day (BID) | ORAL | 0 refills | Status: DC

## 2019-05-05 MED ORDER — FLECAINIDE ACETATE 100 MG PO TABS: 100.0000 mg | ORAL_TABLET | Freq: Two times a day (BID) | ORAL | 3 refills | Status: DC

## 2019-05-05 NOTE — Patient Instructions (Addendum)
Medication Instructions:  Start Flecainide 50mg  twice a day for 3 days then 100mg  twice a day Stop Cardizem *If you need a refill on your cardiac medications before your next appointment, please call your pharmacy*  Lab Work: None  Testing/Procedures: None  Follow-Up: At [facility], you and your health needs are our priority.  As part of our continuing mission to provide you with exceptional heart care, we have created designated Provider Care Teams.  These Care Teams include your primary Cardiologist (physician) and Advanced Practice Providers (APPs -  Physician Assistants and Nurse Practitioners) who all work together to provide you with the care you need, when you need it.  Your next appointment:   1 month(s)  The format for your next appointment:   In Person  Provider:   Odie Sera

## 2019-05-19 ENCOUNTER — Other Ambulatory Visit: Payer: Self-pay

## 2019-05-19 ENCOUNTER — Ambulatory Visit (INDEPENDENT_AMBULATORY_CARE_PROVIDER_SITE_OTHER): Payer: Medicare PPO | Admitting: Pharmacist Clinician (PhC)/ Clinical Pharmacy Specialist

## 2019-05-19 DIAGNOSIS — I480 Paroxysmal atrial fibrillation: Secondary | ICD-10-CM | POA: Diagnosis not present

## 2019-05-19 LAB — POCT INR: INR: 2.4 (ref 2.0–3.0)

## 2019-05-31 ENCOUNTER — Ambulatory Visit: Payer: Medicare Other | Admitting: Cardiology

## 2019-06-07 NOTE — Progress Notes (Signed)
Cardiology Clinic Note   Patient Name: [name] Date of Encounter: 06/09/2019  Primary Care Provider:  Kelton Pillar, MD Primary Cardiologist:  Minus Breeding, MD  Patient Profile    [name]. [age] year old female presents today for follow-up of her paroxysmal atrial fibrillation.  Past Medical History    Past Medical History:  Diagnosis Date  . Borderline hyperlipidemia   . Borderline hypertension   . Hypertension   . Hypothyroid    s/p PTU therapy  . PAF (paroxysmal atrial fibrillation) ([place])    Past Surgical History:  Procedure Laterality Date  . CESAREAN SECTION    . CHOLECYSTECTOMY    . KNEE SURGERY     right 2014, left 2015, arthroscipic  . LEG SURGERY     Left leg d/t  tumor (benign)  . THYROID SURGERY      Allergies  Allergies  Allergen Reactions  . Chocolate Itching    Itching mouth  . Naproxen Sodium Other (See Comments)    Hurt all over  . Penicillins Rash    Has patient had a PCN reaction causing immediate rash, facial/tongue/throat swelling, SOB or lightheadedness with hypotension: Yes Has patient had a PCN reaction causing severe rash involving mucus membranes or skin necrosis: Yes Has patient had a PCN reaction that required hospitalization: No Has patient had a PCN reaction occurring within the last 10 years: No If all of the above answers are "NO", then may proceed with Cephalosporin use.   . Promethazine Hcl Other (See Comments)    Sick     History of Present Illness    [name] has a past medical history ofessential hypertension, allergic asthma, hypothyroidism, dyslipidemia, obesity, and toxic effect of fish and shellfish.  She was last seen by me on 11/20/2018.  During that time she was doing well and had noticed 2 episodes of atrial fibrillation in the prior 6 months.  She stated that the episodes of atrial fibrillation are not lasting as long however she did notice some chest discomfort as she converted back to normal  sinus rhythm.  She was finding it hard to increase her physical activity due to bilateral knee pain and caring for her grandchildren.  Her medication regimen was continued unchanged.  She presented the clinic 01/20/19 and stated that her atrial fibrillation started on 12/31/2018 lasted for about 6 days before she converted to sinus rhythm.  During her bout of PAF she took 50 mg of metoprolol in the morning, 100 mg of metoprolol in the evening and diltiazem 30 mg twice daily.  She then resumed her normal medication regimen of 50 mg of metoprolol twice daily.  She noted that she can feel when she is in atrial fibrillation and has some mild chest discomfort when she converts back to sinus rhythm.  She stated that since she converted back to sinus rhythm she had not had any further episodes of flutter or atrial fibrillation, skipped beats or palpitations.  She was seen by Dr. Percival Spanish on 05/05/2019.  During that time she stated that she had been fairly active.  However, she had been in atrial fibrillation for 3-4 days.  She noticed increased fatigue with this.  She denied chest discomfort, neck discomfort and arm discomfort.  She also had no new shortness of breath, PND, or orthopnea.  She also had no increased lower extremity edema or weight gain.  She was started on flecainide 50 mg twice daily with a plan to transition to flecainide 100 mg  and a plan to perform an ETT if medication was continued.  She presents the clinic today for follow-up and states initially when she started the flecainide she noticed some fatigue.  However, she is now tolerating the medication well.  She has had no recurrent episodes of atrial fibrillation.  She remains active taking care of her grandchildren.  Her heart rate has been in the low 40s at home.  I will decrease her metoprolol to 25 mg twice daily, order a ETT, troponin, and add losartan 50 mg to her medication regimen.  She will need to follow-up in 1 month for lab work.  She  denies chest pain, shortness of breath,  fatigue, palpitations, melena, hematuria, hemoptysis, diaphoresis, weakness, presyncope, syncope, orthopnea, and PND.  Home Medications    Prior to Admission medications   Medication Sig Start Date End Date Taking? Authorizing Provider  flecainide (TAMBOCOR) 100 MG tablet Take 1 tablet (100 mg total) by mouth 2 (two) times daily. 05/05/19   Minus Breeding, MD  flecainide (TAMBOCOR) 50 MG tablet Take 1 tablet (50 mg total) by mouth 2 (two) times daily. Take 50mg  twice a day for 3 days. 05/05/19   Minus Breeding, MD  metoprolol tartrate (LOPRESSOR) 50 MG tablet Take 1 tablet (50 mg total) by mouth 2 (two) times daily. TAKE 1 TABLET BY MOUTH TWICE DAILY 11/20/18   Minus Breeding, MD  omeprazole (PRILOSEC) 10 MG capsule Take 10 mg by mouth daily as needed (acid).     [provider]  warfarin (COUMADIN) 5 MG tablet TAKE 1/2 TO 1 (ONE-HALF TO ONE) TABLET BY MOUTH  DAILY AS DIRECTED BY COUMADIN CLINIC 11/20/18   Minus Breeding, MD    Family History    Family History  Problem Relation Age of Onset  . Lung cancer Father   . Breast cancer Mother        in 10's  . Cirrhosis Brother   . Diabetes Brother   . Breast cancer Sister 13   She indicated that her mother is alive. She indicated that her father is deceased. She indicated that only one of her two sisters is alive. She indicated that only one of her four brothers is alive.  Social History    Social History   Socioeconomic History  . Marital status: Married    Spouse name: Not on file  . Number of children: Not on file  . Years of education: Not on file  . Highest education level: Not on file  Occupational History  . Not on file  Tobacco Use  . Smoking status: Former Smoker    Packs/day: 0.80    Years: 5.00    Pack years: 4.00    Types: Cigarettes    Quit date: 04/08/1982    Years since quitting: 37.1  . Smokeless tobacco: Never Used  Substance and Sexual Activity  . Alcohol  use: No  . Drug use: No  . Sexual activity: Not on file  Other Topics Concern  . Not on file  Social History Narrative  . Not on file   Social Determinants of Health   Financial Resource Strain:   . Difficulty of Paying Living Expenses: Not on file  Food Insecurity:   . Worried About Charity fundraiser in the Last Year: Not on file  . Ran Out of Food in the Last Year: Not on file  Transportation Needs:   . Lack of Transportation (Medical): Not on file  . Lack of Transportation (Non-Medical): Not  on file  Physical Activity:   . Days of Exercise per Week: Not on file  . Minutes of Exercise per Session: Not on file  Stress:   . Feeling of Stress : Not on file  Social Connections:   . Frequency of Communication with Friends and Family: Not on file  . Frequency of Social Gatherings with Friends and Family: Not on file  . Attends Religious Services: Not on file  . Active Member of Clubs or Organizations: Not on file  . Attends Archivist Meetings: Not on file  . Marital Status: Not on file  Intimate Partner Violence:   . Fear of Current or Ex-Partner: Not on file  . Emotionally Abused: Not on file  . Physically Abused: Not on file  . Sexually Abused: Not on file     Review of Systems    General:  No chills, fever, night sweats or weight changes.  Cardiovascular:  No chest pain, dyspnea on exertion, edema, orthopnea, palpitations, paroxysmal nocturnal dyspnea. Dermatological: No rash, lesions/masses Respiratory: No cough, dyspnea Urologic: No hematuria, dysuria Abdominal:   No nausea, vomiting, diarrhea, bright red blood per rectum, melena, or hematemesis Neurologic:  No visual changes, wkns, changes in mental status. All other systems reviewed and are otherwise negative except as noted above.  Physical Exam    VS:  BP (!) 158/62   Pulse (!) 45   Ht 5' (1.524 m)   Wt 234 lb (106.1 kg)   BMI 45.70 kg/m  , BMI Body mass index is 45.7 kg/m. GEN: Well  nourished, well developed, in no acute distress. HEENT: normal. Neck: Supple, no JVD, carotid bruits, or masses. Cardiac: RRR, no murmurs, rubs, or gallops. No clubbing, cyanosis, edema.  Radials/DP/PT 2+ and equal bilaterally.  Respiratory:  Respirations regular and unlabored, clear to auscultation bilaterally. GI: Soft, nontender, nondistended, BS + x 4. MS: no deformity or atrophy. Skin: warm and dry, no rash. Neuro:  Strength and sensation are intact. Psych: Normal affect.  Accessory Clinical Findings    ECG personally reviewed by me today-none today  EKG 01/20/2019 Sinus bradycardia 52 bpm  EKG 11/20/2018  sinus bradycardia with premature atrial complexes 51 bpm  EKG 06/23/2018: Atrial fibrillation 98 bpm  EKG 06/21/2018: Atrial fibrillation with a rapid ventricular response 106 bpm  EKG 12/02/2017: Sinus bradycardia 47 bpm  Echocardiogram 04/08/2016: Study Conclusions  - Left ventricle: The cavity size was normal. Wall thickness was normal. Systolic function was normal. The estimated ejection fraction was in the range of 60% to 65%. Wall motion was normal; there were no regional wall motion abnormalities. Features are consistent with a pseudonormal left ventricular filling pattern, with concomitant abnormal relaxation and increased filling pressure (grade 2 diastolic dysfunction).  Assessment & Plan   1.  Paroxysmal atrial fibrillation-heart rate today 45 bpm  Decrease metoprolol tartrate to 25 mg twice daily-(heart rate 45) Continue flecainide 100 mg twice daily Continue warfarin half to 1 tablet by mouth daily as directed by Coumadin clinic Order ETT with troponin levels the following day.  Essential hypertension- BP today 158/62.    Systolic 0000000 at home. Decrease metoprolol tartrate to 25 mg twice daily-(heart rate 45) Start losartan 50 mg daily Continue heart healthy low-sodium diet-salty 6 given Increase physical activity as tolerated Keep  blood pressure log and bring to next visit Order BMP with follow-up  Lower extremity edema-bilateral trace lower extremity edema. Lower extremity support stockings Elevate lower extremities when not active Heart healthy low-sodium diet-salty 6  given  Hyperlipidemia-LDL 87 05/27/2008 Continue high-fiber low-sodium heart healthy diet Increase physical activity as tolerated Monitored by PCP  Disposition: Follow-up with me in 1 month.  Jossie Ng. [facility] [place] [address] Office [phone] Fax [phone]

## 2019-06-09 ENCOUNTER — Ambulatory Visit: Payer: Medicare PPO | Admitting: General Practice

## 2019-06-09 ENCOUNTER — Encounter: Payer: Self-pay | Admitting: General Practice

## 2019-06-09 ENCOUNTER — Other Ambulatory Visit: Payer: Self-pay

## 2019-06-09 VITALS — BP 158/62 | HR 45 | Ht 60.0 in | Wt 234.0 lb

## 2019-06-09 DIAGNOSIS — I10 Essential (primary) hypertension: Secondary | ICD-10-CM | POA: Diagnosis not present

## 2019-06-09 DIAGNOSIS — R600 Localized edema: Secondary | ICD-10-CM

## 2019-06-09 DIAGNOSIS — I480 Paroxysmal atrial fibrillation: Secondary | ICD-10-CM

## 2019-06-09 DIAGNOSIS — Z79899 Other long term (current) drug therapy: Secondary | ICD-10-CM | POA: Diagnosis not present

## 2019-06-09 DIAGNOSIS — E785 Hyperlipidemia, unspecified: Secondary | ICD-10-CM

## 2019-06-09 MED ORDER — LOSARTAN POTASSIUM 50 MG PO TABS: 50.0000 mg | ORAL_TABLET | Freq: Every day | ORAL | 3 refills | Status: DC

## 2019-06-09 MED ORDER — METOPROLOL TARTRATE 50 MG PO TABS: 25.0000 mg | ORAL_TABLET | Freq: Two times a day (BID) | ORAL | 3 refills | Status: DC

## 2019-06-09 NOTE — Patient Instructions (Signed)
Medication Instructions:  DECREASE METOPROLOL 25MG  (1/2 TAB)   START LOSARTAN 50MG  DAILY  If you need a refill on your cardiac medications before your next appointment, please call your pharmacy.  Labwork: BMET AND TROPONIN THE DAY AFTER YOUR TREADMILL TESTING HERE IN OUR OFFICE AT [facility]      You will NOT need to fast   If you have labs (blood work) drawn today and your tests are completely normal, you will receive your results only by: Marland Kitchen MyChart Message (if you have MyChart) OR A paper copy in the mail If you have any lab test that is abnormal or we need to change your treatment, we will call you to review these results. You may go to any [facility] lab that is convenient for you however, we do have a lab in our office that is able to assist you. You do NOT need an appointment for our lab. Once in our office in our office lobby there is a podium where you sign-in and ring the doorbell to alert [place] that you are here. Lab is open 8:00am and closes at 4:00pm; closes for lunch from 12:45 - 1:45pm. PLEASE BRING A COPY OF YOUR INSURANCE CARD WITH YOU.  Testing/Procedures: Your physician has requested that you have an exercise tolerance test, this is a screening tool to track your fitness level. This test evaluates the your exercise capacity by measuring cardiovascular response to exercise, the stress response is induced by exercise (exercise-treadmill).  Graded exercise test is also known as maximal exercise test or stress EKG test  . Please also follow instruction sheet given.  Special Instructions: PLEASE PURCHASE AND WEAR COMPRESSION STOCKINGS DAILY AND OFF AT BEDTIME. Compression stockings are elastic socks that squeeze the legs. They help to increase blood flow to the legs and to decrease swelling in the legs from fluid retention, and reduce the chance of developing blood clots in the lower legs.   PLEASE TAKE AND LOG YOUR WEIGHT AND BLOOD PRESSURE DAILY  PLEASE READ AND FOLLOW SALTY 6  ATTACHED  Follow-Up: 1 MONTH AFTER TREADMILL  In Person You may see Minus Breeding, MD Coletta Memos, FNP-C or one of the following Advanced Practice Providers on your designated Care Team:  Rosaria Ferries, PA-C  Jory Sims, DNP, ANP  Cadence Kathlen Mody, NP.    At [facility], you and your health needs are our priority.  As part of our continuing mission to provide you with exceptional heart care, we have created designated Provider Care Teams.  These Care Teams include your primary Cardiologist (physician) and Advanced Practice Providers (APPs -  Physician Assistants and Nurse Practitioners) who all work together to provide you with the care you need, when you need it.  Reduce your risk of getting COVID-19 With your heart disease it is especially important for people at increased risk of severe illness from COVID-19, and those who live with them, to protect themselves from getting COVID-19. The best way to protect yourself and to help reduce the spread of the virus that causes COVID-19 is to: Marland Kitchen Limit your interactions with other people as much as possible. . Take COVID-19 when you do interact with others. If you start feeling sick and think you may have COVID-19, get in touch with your healthcare provider within 24 hours.  Thank you for choosing [facility] at [facility]!!

## 2019-06-15 ENCOUNTER — Other Ambulatory Visit (HOSPITAL_COMMUNITY)
Admission: RE | Admit: 2019-06-15 | Discharge: 2019-06-15 | Disposition: A | Discharge status: Home / Self Care | Payer: Medicare PPO | Source: Ambulatory Visit | Attending: Cardiology | Admitting: Cardiology

## 2019-06-15 ENCOUNTER — Telehealth (HOSPITAL_COMMUNITY): Payer: Self-pay

## 2019-06-15 DIAGNOSIS — Z01812 Encounter for preprocedural laboratory examination: Secondary | ICD-10-CM | POA: Insufficient documentation

## 2019-06-15 DIAGNOSIS — Z20822 Contact with and (suspected) exposure to covid-19: Secondary | ICD-10-CM | POA: Diagnosis not present

## 2019-06-15 LAB — SARS CORONAVIRUS 2 (TAT 6-24 HRS): SARS Coronavirus 2: NEGATIVE

## 2019-06-15 NOTE — Telephone Encounter (Signed)
Encounter complete. 

## 2019-06-16 ENCOUNTER — Other Ambulatory Visit: Payer: Self-pay

## 2019-06-16 ENCOUNTER — Ambulatory Visit
Admission: RE | Admit: 2019-06-16 | Discharge: 2019-06-16 | Disposition: A | Discharge status: Home / Self Care | Payer: Medicare PPO | Source: Ambulatory Visit | Attending: Family Medicine | Admitting: Family Medicine

## 2019-06-16 DIAGNOSIS — Z1231 Encounter for screening mammogram for malignant neoplasm of breast: Secondary | ICD-10-CM

## 2019-06-18 ENCOUNTER — Other Ambulatory Visit: Payer: Self-pay

## 2019-06-18 ENCOUNTER — Ambulatory Visit (HOSPITAL_COMMUNITY)
Admission: RE | Admit: 2019-06-18 | Discharge: 2019-06-18 | Disposition: A | Discharge status: Home / Self Care | Payer: Medicare PPO | Source: Ambulatory Visit | Attending: Cardiology | Admitting: Cardiology

## 2019-06-18 DIAGNOSIS — I480 Paroxysmal atrial fibrillation: Secondary | ICD-10-CM | POA: Diagnosis not present

## 2019-06-18 LAB — EXERCISE TOLERANCE TEST
Estimated workload: 2.5 METS
Exercise duration (min): 0 min
Exercise duration (sec): 52 s
MPHR: 149 {beats}/min
Peak HR: 95 {beats}/min
Percent HR: 63 %
Rest HR: 59 {beats}/min

## 2019-06-19 LAB — BASIC METABOLIC PANEL
BUN/Creatinine Ratio: 15 (ref 12–28)
BUN: 12 mg/dL (ref 8–27)
CO2: 25 mmol/L (ref 20–29)
Calcium: 9.1 mg/dL (ref 8.7–10.3)
Chloride: 99 mmol/L (ref 96–106)
Creatinine, Ser: 0.8 mg/dL (ref 0.57–1.00)
GFR calc Af Amer: 86 mL/min/{1.73_m2} (ref >59)
GFR calc non Af Amer: 74 mL/min/{1.73_m2} (ref >59)
Glucose: 94 mg/dL (ref 65–99)
Potassium: 4.3 mmol/L (ref 3.5–5.2)
Sodium: 140 mmol/L (ref 134–144)

## 2019-06-19 LAB — TROPONIN I: Troponin I: <0.01 ng/mL (ref 0.00–0.04)

## 2019-07-24 NOTE — Progress Notes (Signed)
Cardiology Office Note   Date:  07/26/2019   ID:  [name], DOB Nov 03, 1945, MRN [id number]  PCP:  Kelton Pillar, MD  Cardiologist:   Minus Breeding, MD   Chief Complaint  Patient presents with  . Atrial Fibrillation      History of Present Illness:  [name] is a 74 y.o. female who presents for follow up of atrial fib.   At the last visit her metoprolol was decreased because of bradycardia.  Cozaar was added because of hypertension.  Since we last saw her she did have a treadmill test just to follow-up for flecainide treatment.  There were no inducible arrhythmias or EKG changes.  It was a submaximal test as she did not reach the target heart rate.  There is no evidence of ischemia.    She said she is only having the atrial fibrillation about once a month and is very short lived with lasting minutes now.  This is much improved compared to previous.  She does say her blood pressure remains elevated typically above 140/90.  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  She has lots of knee problems and could not get up on the exam table today.  She denies any chest pressure, neck or arm discomfort.  She has had no new shortness of breath, PND or orthopnea.  She is had no presyncope or syncope.   Past Medical History:  Diagnosis Date  . Borderline hyperlipidemia   . Borderline hypertension   . Hypertension   . Hypothyroid    s/p PTU therapy  . PAF (paroxysmal atrial fibrillation) ([place])     Past Surgical History:  Procedure Laterality Date  . CESAREAN SECTION    . CHOLECYSTECTOMY    . KNEE SURGERY     right 2014, left 2015, arthroscipic  . LEG SURGERY     Left leg d/t  tumor (benign)  . THYROID SURGERY       Current Outpatient Medications  Medication Sig Dispense Refill  . flecainide (TAMBOCOR) 100 MG tablet Take 1 tablet (100 mg total) by mouth 2 (two) times daily. 180 tablet 3  .  losartan (COZAAR) 100 MG tablet Take 1 tablet (100 mg total) by mouth daily. 90 tablet 3  . metoprolol tartrate (LOPRESSOR) 50 MG tablet Take 0.5 tablets (25 mg total) by mouth 2 (two) times daily. 180 tablet 3  . omeprazole (PRILOSEC) 10 MG capsule Take 10 mg by mouth daily as needed (acid).     . warfarin (COUMADIN) 5 MG tablet TAKE 1/2 TO 1 (ONE-HALF TO ONE) TABLET BY MOUTH  DAILY AS DIRECTED BY COUMADIN CLINIC 90 tablet 1   No current facility-administered medications for this visit.    Allergies:   Chocolate, Naproxen sodium, Penicillins, and Promethazine hcl    ROS:  Please see the history of present illness.   Otherwise, review of systems are positive for none.   All other systems are reviewed and negative.    PHYSICAL EXAM: VS:  BP 140/72 (BP Location: Left Arm, Patient Position: Sitting, Cuff Size: Large)   Pulse (!) 52   Temp (!) 97.1 F (36.2 C)   Ht 5' (1.524 m)   Wt 229 lb (103.9 kg)   BMI 44.72 kg/m  , BMI Body mass index is 44.72 kg/m. GEN:  No distress NECK:  No jugular venous distention at 90 degrees, waveform within normal limits, carotid upstroke brisk  and symmetric, no bruits, no thyromegaly LYMPHATICS:  No cervical adenopathy LUNGS:  Clear to auscultation bilaterally BACK:  No CVA tenderness CHEST:  Unremarkable HEART:  S1 and S2 within normal limits, no S3, no S4, no clicks, no rubs, no murmurs ABD:  Positive bowel sounds normal in frequency in pitch, no bruits, no rebound, no guarding, unable to assess midline mass or bruit with the patient seated. EXT:  2 plus pulses throughout, no edema, no cyanosis no clubbing SKIN:  No rashes no nodules NEURO:  Cranial nerves II through XII grossly intact, motor grossly intact throughout PSYCH:  Cognitively intact, oriented to person place and time   EKG:  EKG is not ordered today.   Recent Labs: 06/18/2019: BUN 12; Creatinine, Ser 0.80; Potassium 4.3; Sodium 140    Lipid Panel    Component Value Date/Time    CHOL  05/27/2008 0700    149        ATP III CLASSIFICATION:  <200     mg/dL   Desirable  200-239  mg/dL   Borderline High  >=240    mg/dL   High          TRIG 54 05/27/2008 0700   HDL 51 05/27/2008 0700   CHOLHDL 2.9 05/27/2008 0700   VLDL 11 05/27/2008 0700   LDLCALC  05/27/2008 0700    87        Total Cholesterol/HDL:CHD Risk Coronary Heart Disease Risk Table                     Men   Women  1/2 Average Risk   3.4   3.3  Average Risk       5.0   4.4  2 X Average Risk   9.6   7.1  3 X Average Risk  23.4   11.0        Use the calculated Patient Ratio above and the CHD Risk Table to determine the patient's CHD Risk.        ATP III CLASSIFICATION (LDL):  <100     mg/dL   Optimal  100-129  mg/dL   Near or Above                    Optimal  130-159  mg/dL   Borderline  160-189  mg/dL   High  >190     mg/dL   Very High      Wt Readings from Last 3 Encounters:  07/26/19 229 lb (103.9 kg)  06/09/19 234 lb (106.1 kg)  05/05/19 227 lb 6.4 oz (103.1 kg)      Other studies Reviewed: Additional studies/ records that were reviewed today include: POET (Plain Old Exercise Treadmill), labs. Review of the above records demonstrates:  Please see elsewhere in the note.     ASSESSMENT AND PLAN:  Paroxysmal atrial fibrillation:    The patient is having very brief infrequent paroxysms.  I think this is a success of flecainide.  Today we will check her Coumadin level.  Otherwise no change in therapy.   Essential hypertension:  Her blood pressure is not controlled.  I will increase her Cozaar to 100 mg daily.  She will keep a blood pressure check.   Covid education:  She does not want to take the vaccine.  I did discuss this with her and went through some of the data.  She said she had Covid last year but was never sick.     Current  medicines are reviewed at length with the patient today.  The patient does not have concerns regarding medicines.  The following changes have been  made:  As above  Labs/ tests ordered today include: None  Orders Placed This Encounter  Procedures  . CBC     Disposition:   FU with me on one year.     Signed, Minus Breeding, MD  07/26/2019 9:54 AM    [facility]

## 2019-07-26 ENCOUNTER — Encounter: Payer: Self-pay | Admitting: Cardiology

## 2019-07-26 ENCOUNTER — Other Ambulatory Visit: Payer: Self-pay

## 2019-07-26 ENCOUNTER — Ambulatory Visit: Payer: Medicare PPO | Admitting: Cardiology

## 2019-07-26 ENCOUNTER — Ambulatory Visit (INDEPENDENT_AMBULATORY_CARE_PROVIDER_SITE_OTHER): Payer: Medicare PPO | Admitting: Pharmacist

## 2019-07-26 VITALS — BP 140/72 | HR 52 | Temp 97.1°F | Ht 60.0 in | Wt 229.0 lb

## 2019-07-26 DIAGNOSIS — I10 Essential (primary) hypertension: Secondary | ICD-10-CM | POA: Diagnosis not present

## 2019-07-26 DIAGNOSIS — I480 Paroxysmal atrial fibrillation: Secondary | ICD-10-CM | POA: Diagnosis not present

## 2019-07-26 DIAGNOSIS — Z79899 Other long term (current) drug therapy: Secondary | ICD-10-CM

## 2019-07-26 DIAGNOSIS — Z7189 Other specified counseling: Secondary | ICD-10-CM | POA: Diagnosis not present

## 2019-07-26 LAB — CBC
Hematocrit: 40.1 % (ref 34.0–46.6)
Hemoglobin: 13.1 g/dL (ref 11.1–15.9)
MCH: 29.5 pg (ref 26.6–33.0)
MCHC: 32.7 g/dL (ref 31.5–35.7)
MCV: 90 fL (ref 79–97)
Platelets: 220 10*3/uL (ref 150–450)
RBC: 4.44 x10E6/uL (ref 3.77–5.28)
RDW: 12.7 % (ref 11.7–15.4)
WBC: 8.3 10*3/uL (ref 3.4–10.8)

## 2019-07-26 LAB — POCT INR: INR: 2.7 (ref 2.0–3.0)

## 2019-07-26 MED ORDER — WARFARIN SODIUM 5 MG PO TABS: ORAL_TABLET | ORAL | 1 refills | Status: DC

## 2019-07-26 MED ORDER — LOSARTAN POTASSIUM 100 MG PO TABS: 100.0000 mg | ORAL_TABLET | Freq: Every day | ORAL | 3 refills | Status: DC

## 2019-07-26 NOTE — Patient Instructions (Addendum)
Medication Instructions:  INCREASE COZAAR (LOSARTAN) TO 100MG  DAILY *If you need a refill on your cardiac medications before your next appointment, please call your pharmacy*  Lab Work: Your physician recommends that you return for lab work today (CBC)  Testing/Procedures: NONE ORDERED THIS VISIT  Follow-Up: At [facility], you and your health needs are our priority.  As part of our continuing mission to provide you with exceptional heart care, we have created designated Provider Care Teams.  These Care Teams include your primary Cardiologist (physician) and Advanced Practice Providers (APPs -  Physician Assistants and Nurse Practitioners) who all work together to provide you with the care you need, when you need it.  Your next appointment:   12 month(s)  You will receive a reminder letter in the mail two months in advance. If you don't receive a letter, please call our office to schedule the follow-up appointment.  The format for your next appointment:   In Person  Provider:   Minus Breeding, MD

## 2019-08-26 ENCOUNTER — Encounter: Payer: Self-pay | Admitting: Cardiology

## 2019-09-20 ENCOUNTER — Ambulatory Visit (INDEPENDENT_AMBULATORY_CARE_PROVIDER_SITE_OTHER): Payer: Medicare PPO | Admitting: Pharmacist

## 2019-09-20 ENCOUNTER — Other Ambulatory Visit: Payer: Self-pay

## 2019-09-20 DIAGNOSIS — I480 Paroxysmal atrial fibrillation: Secondary | ICD-10-CM | POA: Diagnosis not present

## 2019-09-20 LAB — POCT INR: INR: 3.5 — AB (ref 2.0–3.0)

## 2019-10-18 ENCOUNTER — Other Ambulatory Visit: Payer: Self-pay

## 2019-10-18 ENCOUNTER — Ambulatory Visit (INDEPENDENT_AMBULATORY_CARE_PROVIDER_SITE_OTHER): Payer: Medicare PPO | Admitting: Pharmacist Clinician (PhC)/ Clinical Pharmacy Specialist

## 2019-10-18 DIAGNOSIS — I480 Paroxysmal atrial fibrillation: Secondary | ICD-10-CM | POA: Diagnosis not present

## 2019-10-18 DIAGNOSIS — I4891 Unspecified atrial fibrillation: Secondary | ICD-10-CM

## 2019-10-18 LAB — POCT INR: INR: 2.4 (ref 2.0–3.0)

## 2019-11-16 DIAGNOSIS — H524 Presbyopia: Secondary | ICD-10-CM | POA: Diagnosis not present

## 2019-11-29 ENCOUNTER — Ambulatory Visit (INDEPENDENT_AMBULATORY_CARE_PROVIDER_SITE_OTHER): Payer: Medicare PPO | Admitting: Pharmacist

## 2019-11-29 ENCOUNTER — Other Ambulatory Visit: Payer: Self-pay

## 2019-11-29 DIAGNOSIS — I4891 Unspecified atrial fibrillation: Secondary | ICD-10-CM

## 2019-11-29 DIAGNOSIS — I480 Paroxysmal atrial fibrillation: Secondary | ICD-10-CM | POA: Diagnosis not present

## 2019-11-29 LAB — POCT INR: INR: 2.1 (ref 2.0–3.0)

## 2019-11-29 NOTE — Patient Instructions (Addendum)
Description   Continue with 1 tablet daily except 1/2 tablet each Monday, Wednesday and Friday. Repeat INR in 6 weeks.

## 2019-11-30 DIAGNOSIS — M25561 Pain in right knee: Secondary | ICD-10-CM | POA: Diagnosis not present

## 2020-01-10 ENCOUNTER — Ambulatory Visit (INDEPENDENT_AMBULATORY_CARE_PROVIDER_SITE_OTHER): Payer: Medicare PPO

## 2020-01-10 ENCOUNTER — Other Ambulatory Visit: Payer: Self-pay

## 2020-01-10 DIAGNOSIS — Z5181 Encounter for therapeutic drug level monitoring: Secondary | ICD-10-CM | POA: Diagnosis not present

## 2020-01-10 DIAGNOSIS — I480 Paroxysmal atrial fibrillation: Secondary | ICD-10-CM | POA: Diagnosis not present

## 2020-01-10 LAB — POCT INR: INR: 2.8 (ref 2.0–3.0)

## 2020-01-10 NOTE — Patient Instructions (Signed)
Continue with 1 tablet daily except 1/2 tablet each Monday, Wednesday and Friday. Repeat INR in 6 weeks.   

## 2020-01-15 DIAGNOSIS — Z23 Encounter for immunization: Secondary | ICD-10-CM | POA: Diagnosis not present

## 2020-01-19 ENCOUNTER — Telehealth: Payer: Self-pay | Admitting: Cardiology

## 2020-01-19 NOTE — Telephone Encounter (Signed)
Pt c/o medication issue:  1. Name of Medication: metoprolol tartrate (LOPRESSOR) 50 MG tablet  2. How are you currently taking this medication (dosage and times per day)? As directed  3. Are you having a reaction (difficulty breathing--STAT)? no  4. What is your medication issue? Patient states that her medication was cut down by Coletta Memos and so she has not had to order a new prescription in 7 months. It is time for a refill but she wants to know if she needs to stay on this dose or go back to the original dose.

## 2020-01-19 NOTE — Telephone Encounter (Signed)
Spoke with pt and is aware script was sent for Metoprolol in March 2021 and this script is good for year expires 3/22 Pt will call  if needs new script  ./cy

## 2020-02-21 ENCOUNTER — Ambulatory Visit (INDEPENDENT_AMBULATORY_CARE_PROVIDER_SITE_OTHER): Payer: Medicare PPO

## 2020-02-21 ENCOUNTER — Other Ambulatory Visit: Payer: Self-pay

## 2020-02-21 DIAGNOSIS — I480 Paroxysmal atrial fibrillation: Secondary | ICD-10-CM | POA: Diagnosis not present

## 2020-02-21 DIAGNOSIS — Z5181 Encounter for therapeutic drug level monitoring: Secondary | ICD-10-CM

## 2020-02-21 LAB — POCT INR: INR: 2.3 (ref 2.0–3.0)

## 2020-02-21 NOTE — Patient Instructions (Signed)
Continue with 1 tablet daily except 1/2 tablet each Monday, Wednesday and Friday. Repeat INR in 6 weeks.

## 2020-02-29 ENCOUNTER — Other Ambulatory Visit: Payer: Self-pay | Admitting: Family Medicine

## 2020-02-29 DIAGNOSIS — K219 Gastro-esophageal reflux disease without esophagitis: Secondary | ICD-10-CM | POA: Diagnosis not present

## 2020-02-29 DIAGNOSIS — M8580 Other specified disorders of bone density and structure, unspecified site: Secondary | ICD-10-CM

## 2020-02-29 DIAGNOSIS — E039 Hypothyroidism, unspecified: Secondary | ICD-10-CM | POA: Diagnosis not present

## 2020-02-29 DIAGNOSIS — M1990 Unspecified osteoarthritis, unspecified site: Secondary | ICD-10-CM | POA: Diagnosis not present

## 2020-02-29 DIAGNOSIS — J45909 Unspecified asthma, uncomplicated: Secondary | ICD-10-CM | POA: Diagnosis not present

## 2020-02-29 DIAGNOSIS — Z1231 Encounter for screening mammogram for malignant neoplasm of breast: Secondary | ICD-10-CM

## 2020-02-29 DIAGNOSIS — I10 Essential (primary) hypertension: Secondary | ICD-10-CM | POA: Diagnosis not present

## 2020-02-29 DIAGNOSIS — I4891 Unspecified atrial fibrillation: Secondary | ICD-10-CM | POA: Diagnosis not present

## 2020-02-29 DIAGNOSIS — Z0000 Encounter for general adult medical examination without abnormal findings: Secondary | ICD-10-CM | POA: Diagnosis not present

## 2020-02-29 DIAGNOSIS — D6869 Other thrombophilia: Secondary | ICD-10-CM | POA: Diagnosis not present

## 2020-03-28 ENCOUNTER — Other Ambulatory Visit: Payer: Self-pay | Admitting: Cardiology

## 2020-04-03 ENCOUNTER — Ambulatory Visit (INDEPENDENT_AMBULATORY_CARE_PROVIDER_SITE_OTHER): Payer: Medicare PPO

## 2020-04-03 ENCOUNTER — Other Ambulatory Visit: Payer: Self-pay

## 2020-04-03 DIAGNOSIS — I480 Paroxysmal atrial fibrillation: Secondary | ICD-10-CM

## 2020-04-03 DIAGNOSIS — Z5181 Encounter for therapeutic drug level monitoring: Secondary | ICD-10-CM | POA: Diagnosis not present

## 2020-04-03 LAB — POCT INR: INR: 1.9 — AB (ref 2.0–3.0)

## 2020-04-03 NOTE — Patient Instructions (Signed)
Take 1 tablet today and then Continue with 1 tablet daily except 1/2 tablet each Monday, Wednesday and Friday. Repeat INR in 6 weeks.

## 2020-04-16 ENCOUNTER — Other Ambulatory Visit: Payer: Self-pay | Admitting: Cardiology

## 2020-05-09 DIAGNOSIS — R82998 Other abnormal findings in urine: Secondary | ICD-10-CM | POA: Diagnosis not present

## 2020-05-09 DIAGNOSIS — D6869 Other thrombophilia: Secondary | ICD-10-CM | POA: Diagnosis not present

## 2020-05-09 DIAGNOSIS — N9489 Other specified conditions associated with female genital organs and menstrual cycle: Secondary | ICD-10-CM | POA: Diagnosis not present

## 2020-05-09 DIAGNOSIS — I4891 Unspecified atrial fibrillation: Secondary | ICD-10-CM | POA: Diagnosis not present

## 2020-05-09 DIAGNOSIS — R509 Fever, unspecified: Secondary | ICD-10-CM | POA: Diagnosis not present

## 2020-05-10 ENCOUNTER — Other Ambulatory Visit: Payer: Self-pay | Admitting: Physician Assistant

## 2020-05-10 ENCOUNTER — Other Ambulatory Visit (HOSPITAL_COMMUNITY): Payer: Self-pay | Admitting: Physician Assistant

## 2020-05-10 DIAGNOSIS — K7689 Other specified diseases of liver: Secondary | ICD-10-CM

## 2020-05-11 ENCOUNTER — Other Ambulatory Visit: Payer: Self-pay

## 2020-05-11 ENCOUNTER — Ambulatory Visit (HOSPITAL_COMMUNITY)
Admission: RE | Admit: 2020-05-11 | Discharge: 2020-05-11 | Disposition: A | Discharge status: Home / Self Care | Payer: Medicare PPO | Source: Ambulatory Visit | Attending: Physician Assistant | Admitting: Physician Assistant

## 2020-05-11 DIAGNOSIS — K7689 Other specified diseases of liver: Secondary | ICD-10-CM | POA: Insufficient documentation

## 2020-05-11 DIAGNOSIS — N281 Cyst of kidney, acquired: Secondary | ICD-10-CM | POA: Diagnosis not present

## 2020-05-15 ENCOUNTER — Ambulatory Visit (INDEPENDENT_AMBULATORY_CARE_PROVIDER_SITE_OTHER): Payer: Medicare PPO

## 2020-05-15 ENCOUNTER — Other Ambulatory Visit: Payer: Self-pay

## 2020-05-15 DIAGNOSIS — I480 Paroxysmal atrial fibrillation: Secondary | ICD-10-CM | POA: Diagnosis not present

## 2020-05-15 DIAGNOSIS — Z5181 Encounter for therapeutic drug level monitoring: Secondary | ICD-10-CM

## 2020-05-15 LAB — POCT INR: INR: 2.7 (ref 2.0–3.0)

## 2020-05-15 NOTE — Patient Instructions (Signed)
Continue with 1 tablet daily except 1/2 tablet each Monday, Wednesday and Friday. Repeat INR in 6 weeks.   

## 2020-05-16 ENCOUNTER — Other Ambulatory Visit: Payer: Self-pay | Admitting: Family Medicine

## 2020-05-16 DIAGNOSIS — R945 Abnormal results of liver function studies: Secondary | ICD-10-CM

## 2020-05-16 DIAGNOSIS — R319 Hematuria, unspecified: Secondary | ICD-10-CM

## 2020-05-16 DIAGNOSIS — R509 Fever, unspecified: Secondary | ICD-10-CM

## 2020-05-16 DIAGNOSIS — R102 Pelvic and perineal pain: Secondary | ICD-10-CM

## 2020-05-18 DIAGNOSIS — K7689 Other specified diseases of liver: Secondary | ICD-10-CM | POA: Diagnosis not present

## 2020-05-18 DIAGNOSIS — N289 Disorder of kidney and ureter, unspecified: Secondary | ICD-10-CM | POA: Diagnosis not present

## 2020-05-22 ENCOUNTER — Other Ambulatory Visit (HOSPITAL_COMMUNITY): Payer: Self-pay | Admitting: Family Medicine

## 2020-05-22 DIAGNOSIS — R945 Abnormal results of liver function studies: Secondary | ICD-10-CM

## 2020-05-22 DIAGNOSIS — R509 Fever, unspecified: Secondary | ICD-10-CM

## 2020-05-22 DIAGNOSIS — R102 Pelvic and perineal pain: Secondary | ICD-10-CM

## 2020-05-22 DIAGNOSIS — R319 Hematuria, unspecified: Secondary | ICD-10-CM

## 2020-05-25 ENCOUNTER — Other Ambulatory Visit: Payer: Self-pay

## 2020-05-25 ENCOUNTER — Ambulatory Visit (HOSPITAL_COMMUNITY)
Admission: RE | Admit: 2020-05-25 | Discharge: 2020-05-25 | Disposition: A | Discharge status: Home / Self Care | Payer: Medicare PPO | Source: Ambulatory Visit | Attending: Family Medicine | Admitting: Family Medicine

## 2020-05-25 DIAGNOSIS — R945 Abnormal results of liver function studies: Secondary | ICD-10-CM | POA: Insufficient documentation

## 2020-05-25 DIAGNOSIS — R7989 Other specified abnormal findings of blood chemistry: Secondary | ICD-10-CM | POA: Diagnosis not present

## 2020-05-25 DIAGNOSIS — R102 Pelvic and perineal pain: Secondary | ICD-10-CM | POA: Diagnosis not present

## 2020-05-25 DIAGNOSIS — N281 Cyst of kidney, acquired: Secondary | ICD-10-CM | POA: Diagnosis not present

## 2020-05-25 DIAGNOSIS — K838 Other specified diseases of biliary tract: Secondary | ICD-10-CM | POA: Diagnosis not present

## 2020-05-25 DIAGNOSIS — R509 Fever, unspecified: Secondary | ICD-10-CM

## 2020-05-25 DIAGNOSIS — R319 Hematuria, unspecified: Secondary | ICD-10-CM | POA: Diagnosis not present

## 2020-05-25 MED ORDER — GADOBUTROL 1 MMOL/ML IV SOLN
10.0000 mL | Freq: Once | INTRAVENOUS | Status: AC | PRN
  Administered 2020-05-25: 10 mL via INTRAVENOUS

## 2020-06-01 ENCOUNTER — Encounter: Payer: Self-pay | Admitting: Gastroenterology

## 2020-06-10 ENCOUNTER — Other Ambulatory Visit: Payer: Medicare PPO

## 2020-06-15 ENCOUNTER — Other Ambulatory Visit (INDEPENDENT_AMBULATORY_CARE_PROVIDER_SITE_OTHER): Payer: Medicare PPO

## 2020-06-15 ENCOUNTER — Encounter: Payer: Self-pay | Admitting: Gastroenterology

## 2020-06-15 ENCOUNTER — Ambulatory Visit: Payer: Medicare PPO | Admitting: Gastroenterology

## 2020-06-15 VITALS — BP 126/80 | HR 42 | Ht 59.0 in | Wt 219.0 lb

## 2020-06-15 DIAGNOSIS — R9389 Abnormal findings on diagnostic imaging of other specified body structures: Secondary | ICD-10-CM

## 2020-06-15 DIAGNOSIS — R1013 Epigastric pain: Secondary | ICD-10-CM | POA: Insufficient documentation

## 2020-06-15 DIAGNOSIS — R7989 Other specified abnormal findings of blood chemistry: Secondary | ICD-10-CM

## 2020-06-15 LAB — HEPATIC FUNCTION PANEL
ALT: 54 U/L — ABNORMAL HIGH (ref 0–35)
AST: 47 U/L — ABNORMAL HIGH (ref 0–37)
Albumin: 3.3 g/dL — ABNORMAL LOW (ref 3.5–5.2)
Alkaline Phosphatase: 363 U/L — ABNORMAL HIGH (ref 39–117)
Bilirubin, Direct: 0.5 mg/dL — ABNORMAL HIGH (ref 0.0–0.3)
Total Bilirubin: 1.2 mg/dL (ref 0.2–1.2)
Total Protein: 7.4 g/dL (ref 6.0–8.3)

## 2020-06-15 LAB — IBC + FERRITIN
Ferritin: 230.2 ng/mL (ref 10.0–291.0)
Iron: 108 ug/dL (ref 42–145)
Saturation Ratios: 36.2 % (ref 20.0–50.0)
Transferrin: 213 mg/dL (ref 212.0–360.0)

## 2020-06-15 NOTE — Patient Instructions (Addendum)
If you are age 73 or older, your body mass index should be between 23-30. Your Body mass index is 44.23 kg/m. If this is out of the aforementioned range listed, please consider follow up with your Primary Care Provider.  If you are age 50 or younger, your body mass index should be between 19-25. Your Body mass index is 44.23 kg/m. If this is out of the aformentioned range listed, please consider follow up with your Primary Care Provider.   Your provider has requested that you go to the basement level for lab work before leaving today. Press "B" on the elevator. The lab is located at the first door on the left as you exit the elevator.  Start taking Omeprazole 20 mg daily.

## 2020-06-15 NOTE — Progress Notes (Addendum)
06/15/2020 [name] [id number] [birth year]   HISTORY OF PRESENT ILLNESS: This is a 75 year old female who is referred here by her PCP, Dr. Laurann Montana, for evaluation regarding LFTs and abnormal imaging.  The patient had normal LFTs in November 21.  Then on labs last month, February, they were elevated up to total bilirubin of 3.5, alk phos of 338, AST of 155, and ALT of 118.  She never recalls them being elevated in the past.  She had an ultrasound performed that showed some left kidney cysts but otherwise was unremarkable.  MRI of the abdomen was then performed and showed mild intrahepatic biliary duct prominence with upper normal diameter of extrahepatic biliary ductal anatomy with no obstructing mass lesion or choledocholithiasis identified.  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  She reports some epigastric abdominal pain after she eats.  She said that previously if she had discomfort there and she take some over-the-counter omeprazole and it would help.  She only uses the omeprazole on an as-needed basis.  She is on Coumadin for history of atrial fibrillation.  Her cardiologist is Dr. Percival Spanish.   Past Medical History:  Diagnosis Date  . Borderline hyperlipidemia   . Borderline hypertension   . Hypertension   . Hypothyroid    s/p PTU therapy  . PAF (paroxysmal atrial fibrillation) ([place])    Past Surgical History:  Procedure Laterality Date  . CESAREAN SECTION    . CHOLECYSTECTOMY    . KNEE SURGERY     right 2014, left 2015, arthroscipic  . LEG SURGERY     Left leg d/t  tumor (benign)  . THYROID SURGERY      reports that she quit smoking about 38 years ago. Her smoking use included cigarettes. She has a 4.00 pack-year smoking history. She has never used smokeless tobacco. She reports that she does not drink alcohol and does not use  drugs. family history includes Breast cancer in her mother; Breast cancer (age of onset: 8) in her sister; Cirrhosis in her brother; Diabetes in her brother; Lung cancer in her father. Allergies  Allergen Reactions  . Chocolate Itching    Itching mouth  . Naproxen Sodium Other (See Comments)    Hurt all over  . Penicillins Rash    Has patient had a PCN reaction causing immediate rash, facial/tongue/throat swelling, SOB or lightheadedness with hypotension: Yes Has patient had a PCN reaction causing severe rash involving mucus membranes or skin necrosis: Yes Has patient had a PCN reaction that required hospitalization: No Has patient had a PCN reaction occurring within the last 10 years: No If all of the above answers are "NO", then may proceed with Cephalosporin use.   . Promethazine Hcl Other (See Comments)    Sick       Outpatient Encounter Medications as of 06/15/2020  Medication Sig  . flecainide (TAMBOCOR) 100 MG tablet Take 1 tablet by mouth twice daily  . metoprolol tartrate (LOPRESSOR) 50 MG tablet Take 0.5 tablets (25 mg total) by mouth 2 (two) times daily.  Marland Kitchen omeprazole (PRILOSEC) 10 MG capsule Take 10 mg by mouth daily as needed (acid).   . warfarin (COUMADIN) 5 MG tablet TAKE 1/2 TO 1 (ONE-HALF TO ONE) TABLET BY MOUTH  DAILY AS DIRECTED BY COUMADIN CLINIC  . [DISCONTINUED] losartan (COZAAR) 100 MG tablet Take  1 tablet (100 mg total) by mouth daily.   No facility-administered encounter medications on file as of 06/15/2020.     REVIEW OF SYSTEMS  : All other systems reviewed and negative except where noted in the History of Present Illness.   PHYSICAL EXAM: BP 126/80   Pulse (!) 42   Ht 4' 11" (1.499 m)   Wt 219 lb (99.3 kg)   BMI 44.23 kg/m  General: Well developed white female in no acute distress Head: Normocephalic and atraumatic Eyes:  Sclerae anicteric, conjunctiva pink. Ears: Normal auditory acuity Lungs: Clear throughout to auscultation; no W/R/R. Heart:  Bradycardic; no M/R/G. Abdomen: Soft, non-distended.  BS present.  Mild epigastric TTP. Musculoskeletal: Symmetrical with no gross deformities  Skin: No lesions on visible extremities Extremities: No edema  Neurological: Alert oriented x 4, grossly non-focal Psychological:  Alert and cooperative. Normal mood and affect  ASSESSMENT AND PLAN: *75 year old female with newly elevated LFTs, bilirubin up to 3.7 and alk phos in the 300s with only mild elevation of AST and ALT.  MRCP showing mild intrahepatic bile duct prominence and a 6 to 7 mm enhancing nodule identified at the head of the pancreas in the region of the ampulla that may be related to the duodenum or ampulla/distal common bile duct.  Discussed with Dr. Loletha Carrow.  Will forward to Dr. Ardis Hughs and Dr. Rush Landmark to see if they think EUS possible ERCP is warranted or if we should just start with EGD.  We will recheck LFTs today as well as liver serologies including iron studies, ANA, AMA, anti-smooth muscle antibody, IgG, alpha-1 antitrypsin, and ceruloplasmin.  Will start taking her OTC omeprazole 20 mg daily for now. *Chronic anticoagulation use with Coumadin due to history of atrial fibrillation: Cardiologist is Dr. Percival Spanish.  Obviously this will need to be held for any procedures.   CC:  Kelton Pillar, MD

## 2020-06-16 ENCOUNTER — Ambulatory Visit
Admission: RE | Admit: 2020-06-16 | Discharge: 2020-06-16 | Disposition: A | Discharge status: Home / Self Care | Payer: Medicare PPO | Source: Ambulatory Visit | Attending: Family Medicine | Admitting: Family Medicine

## 2020-06-16 ENCOUNTER — Telehealth: Payer: Self-pay

## 2020-06-16 ENCOUNTER — Ambulatory Visit: Payer: Medicare PPO

## 2020-06-16 ENCOUNTER — Other Ambulatory Visit: Payer: Self-pay

## 2020-06-16 ENCOUNTER — Other Ambulatory Visit: Payer: Medicare PPO

## 2020-06-16 DIAGNOSIS — Z1231 Encounter for screening mammogram for malignant neoplasm of breast: Secondary | ICD-10-CM | POA: Diagnosis not present

## 2020-06-16 NOTE — Telephone Encounter (Signed)
[name], I agree that EUS +/- ERCP at same time is probably best.  This double type appt requires 2-3 hours time I know our current availability for this is several weeks out. Probably safest to check interval LFTs in 2-3 weeks.  [name], She needs EUS +/- ERCP with GM or myself, first available appointment for abnormal ampulla, pancreas, elevated liver tests.  thanks

## 2020-06-16 NOTE — Progress Notes (Signed)
[name], I agree that EUS +/- ERCP at same time is probably best.  This double type appt requires 2-3 hours time I know our current availability for this is several weeks out. Probably safest to check interval LFTs in 2-3 weeks.  [name], She needs EUS +/- ERCP with GM or myself, first available appointment for abnormal ampulla, pancreas, elevated liver tests.  thanks

## 2020-06-16 NOTE — Telephone Encounter (Signed)
-----   Message from Milus Banister, MD sent at 06/16/2020  1:29 PM EST -----   ----- Message ----- From: Doran Stabler, MD Sent: 06/16/2020   6:12 AM EST To: Milus Banister, MD, Loralie Champagne, PA-C, #    ----- Message ----- From: Ritta Slot Sent: 06/15/2020   1:34 PM EST To: Milus Banister, MD, Doran Stabler, MD, #

## 2020-06-16 NOTE — Progress Notes (Signed)
____________________________________________________________  Attending physician addendum:  Thank you for sending this case to me and discussing it in clinic. I have reviewed the entire note.  LFTs done after this visit show improved (but not normalized) AST/ALT and T bili, but Alk Phos still significantly elevated. I think EUS +/- ERCP is warranted.  EGD gives limited ampulla evaluation and cannot assess ductal dilatation and question of pancreatic head abnormality on MRCP. She needs to be off coumadin for a procedure, so we want to perform the highest yield procedure possible.  Wilfrid Lund, MD  ____________________________________________________________

## 2020-06-18 LAB — MITOCHONDRIAL ANTIBODIES: Mitochondrial M2 Ab, IgG: <=20 U

## 2020-06-18 LAB — IGG: IgG (Immunoglobin G), Serum: 1460 mg/dL (ref 600–1540)

## 2020-06-18 LAB — ANTI-NUCLEAR AB-TITER (ANA TITER): ANA Titer 1: 1:80 {titer} — ABNORMAL HIGH

## 2020-06-18 LAB — ANTI-SMOOTH MUSCLE ANTIBODY, IGG: Actin (Smooth Muscle) Antibody (IGG): <20 U (ref <20)

## 2020-06-18 LAB — ALPHA-1-ANTITRYPSIN: A-1 Antitrypsin, Ser: 240 mg/dL — ABNORMAL HIGH (ref 83–199)

## 2020-06-18 LAB — ANA: Anti Nuclear Antibody (ANA): POSITIVE — AB

## 2020-06-18 LAB — CERULOPLASMIN: Ceruloplasmin: 39 mg/dL (ref 18–53)

## 2020-06-19 ENCOUNTER — Other Ambulatory Visit: Payer: Self-pay

## 2020-06-19 ENCOUNTER — Telehealth: Payer: Self-pay

## 2020-06-19 DIAGNOSIS — R7989 Other specified abnormal findings of blood chemistry: Secondary | ICD-10-CM

## 2020-06-19 DIAGNOSIS — R9389 Abnormal findings on diagnostic imaging of other specified body structures: Secondary | ICD-10-CM

## 2020-06-19 DIAGNOSIS — R948 Abnormal results of function studies of other organs and systems: Secondary | ICD-10-CM

## 2020-06-19 NOTE — Telephone Encounter (Signed)
[facility] Pre-operative Risk Assessment     Request for surgical clearance:     Endoscopy Procedure  What type of surgery is being performed?     EUS ERCP   When is this surgery scheduled?     08/10/20  What type of clearance is required ?   Pharmacy  Are there any medications that need to be held prior to surgery and how long? Coumadin  Practice name and name of physician performing surgery?      [facility]  What is your office phone and fax number?      Phone- [phone]  [phone]  Anesthesia type (None, local, MAC, general) ?       MAC

## 2020-06-19 NOTE — Telephone Encounter (Signed)
   Primary Cardiologist: Minus Breeding, MD  Chart reviewed and patient contacted today by phone as part of pre-operative protocol coverage. Given past medical history and time since last visit, based on ACC/AHA guidelines, [name] would be at acceptable risk for the planned procedure without further cardiovascular testing.   Ok to hold Coumadin 5 days pre op, resume PM of procedure if possible.  The patient understands these instructions.   The patient was advised that if she develops new symptoms prior to surgery to contact our office to arrange for a follow-up visit, and she verbalized understanding.  I will route this recommendation to the requesting party via Epic fax function and remove from pre-op pool.  Please call with questions.  Kerin Ransom, PA-C 06/19/2020, 3:37 PM

## 2020-06-19 NOTE — Telephone Encounter (Signed)
Patient with diagnosis of afib on warfarin for anticoagulation.    Procedure: EUS ERCP Date of procedure: 08/10/20  CHA2DS2-VASc Score = 3  This indicates a 3.2% annual risk of stroke. The patient's score is based upon: CHF History: No HTN History: Yes Diabetes History: No Stroke History: No Vascular Disease History: No Age Score: 1 Gender Score: 1  CrCl 33 mL/min using adjusted body weight  Platelet count 220K  Per office protocol, patient can hold warfarin for 5 days prior to procedure.    Patient should restart warfarin on the evening of procedure or day after, at discretion of procedure MD

## 2020-06-19 NOTE — Progress Notes (Signed)
DJ, thanks for the input.  Pattty, Please arrange LFTs in 2 weeks  - HD

## 2020-06-19 NOTE — Progress Notes (Signed)
Lab order entered the pt has been advised

## 2020-06-19 NOTE — Telephone Encounter (Signed)
EUS ERCP scheduled with Dr Ardis Hughs on 08/10/20 at [facility].  COVID test on 5/2 Coumadin letter sent to Dr Warren Lacy.    EUS/ERCP scheduled, pt instructed and medications reviewed.  Patient instructions mailed to home.  Patient to call with any questions or concerns.

## 2020-06-24 ENCOUNTER — Other Ambulatory Visit: Payer: Self-pay | Admitting: Cardiology

## 2020-06-26 ENCOUNTER — Ambulatory Visit (INDEPENDENT_AMBULATORY_CARE_PROVIDER_SITE_OTHER): Payer: Medicare PPO | Admitting: Pharmacist Clinician (PhC)/ Clinical Pharmacy Specialist

## 2020-06-26 ENCOUNTER — Other Ambulatory Visit: Payer: Self-pay

## 2020-06-26 DIAGNOSIS — I480 Paroxysmal atrial fibrillation: Secondary | ICD-10-CM | POA: Diagnosis not present

## 2020-06-26 DIAGNOSIS — I4891 Unspecified atrial fibrillation: Secondary | ICD-10-CM | POA: Diagnosis not present

## 2020-06-26 LAB — POCT INR: INR: 1.4 — AB (ref 2.0–3.0)

## 2020-06-26 NOTE — Patient Instructions (Signed)
Take 7.5 mg (1.5 tablets) Monday March 21 and Tuesday March 22, then continue with 5 mg daily except 2.5 mg each Monday, Wednesday and Friday.  Stop warfarin, with last dose on April 29 for endoscopy scheduled for May 5.  Resume normal doses on May 5 after procedure, unless told otherwise by GI MD.  Will repeat INR 10 days after restarting.

## 2020-07-02 ENCOUNTER — Encounter (HOSPITAL_COMMUNITY): Payer: Self-pay

## 2020-07-02 ENCOUNTER — Emergency Department (HOSPITAL_COMMUNITY)
Admission: EM | Admit: 2020-07-02 | Discharge: 2020-07-02 | Disposition: A | Discharge status: Home / Self Care | Payer: Medicare PPO | Attending: Emergency Medicine | Admitting: Emergency Medicine

## 2020-07-02 ENCOUNTER — Other Ambulatory Visit: Payer: Self-pay

## 2020-07-02 DIAGNOSIS — R1110 Vomiting, unspecified: Secondary | ICD-10-CM | POA: Insufficient documentation

## 2020-07-02 DIAGNOSIS — Z5321 Procedure and treatment not carried out due to patient leaving prior to being seen by health care provider: Secondary | ICD-10-CM | POA: Insufficient documentation

## 2020-07-02 DIAGNOSIS — R1013 Epigastric pain: Secondary | ICD-10-CM | POA: Diagnosis not present

## 2020-07-02 LAB — COMPREHENSIVE METABOLIC PANEL
ALT: 88 U/L — ABNORMAL HIGH (ref 0–44)
AST: 147 U/L — ABNORMAL HIGH (ref 15–41)
Albumin: 3 g/dL — ABNORMAL LOW (ref 3.5–5.0)
Alkaline Phosphatase: 346 U/L — ABNORMAL HIGH (ref 38–126)
Anion gap: 9 (ref 5–15)
BUN: 13 mg/dL (ref 8–23)
CO2: 25 mmol/L (ref 22–32)
Calcium: 9 mg/dL (ref 8.9–10.3)
Chloride: 102 mmol/L (ref 98–111)
Creatinine, Ser: 1.28 mg/dL — ABNORMAL HIGH (ref 0.44–1.00)
GFR, Estimated: 44 mL/min — ABNORMAL LOW (ref >60)
Glucose, Bld: 116 mg/dL — ABNORMAL HIGH (ref 70–99)
Potassium: 3.4 mmol/L — ABNORMAL LOW (ref 3.5–5.1)
Sodium: 136 mmol/L (ref 135–145)
Total Bilirubin: 5.6 mg/dL — ABNORMAL HIGH (ref 0.3–1.2)
Total Protein: 7.4 g/dL (ref 6.5–8.1)

## 2020-07-02 LAB — LIPASE, BLOOD: Lipase: 26 U/L (ref 11–51)

## 2020-07-02 LAB — CBC
HCT: 40.2 % (ref 36.0–46.0)
Hemoglobin: 13.1 g/dL (ref 12.0–15.0)
MCH: 30.1 pg (ref 26.0–34.0)
MCHC: 32.6 g/dL (ref 30.0–36.0)
MCV: 92.4 fL (ref 80.0–100.0)
Platelets: 204 10*3/uL (ref 150–400)
RBC: 4.35 MIL/uL (ref 3.87–5.11)
RDW: 14.8 % (ref 11.5–15.5)
WBC: 11.3 10*3/uL — ABNORMAL HIGH (ref 4.0–10.5)
nRBC: 0 % (ref 0.0–0.2)

## 2020-07-02 NOTE — ED Triage Notes (Signed)
Pt reports that she is having epigastric pain and vomiting that started 2 days ago. Denies SOB and diarrhea. Reports fever at home of 100.6.

## 2020-07-02 NOTE — ED Notes (Signed)
Patient left on own accord °

## 2020-07-10 ENCOUNTER — Ambulatory Visit (INDEPENDENT_AMBULATORY_CARE_PROVIDER_SITE_OTHER): Payer: Medicare PPO

## 2020-07-10 ENCOUNTER — Other Ambulatory Visit: Payer: Self-pay

## 2020-07-10 DIAGNOSIS — I480 Paroxysmal atrial fibrillation: Secondary | ICD-10-CM

## 2020-07-10 DIAGNOSIS — Z5181 Encounter for therapeutic drug level monitoring: Secondary | ICD-10-CM

## 2020-07-10 LAB — POCT INR: INR: 6 — AB (ref 2.0–3.0)

## 2020-07-10 NOTE — Patient Instructions (Signed)
Hold today, tomorrow and Wednesday and then continue with 5 mg daily except 2.5 mg each Monday, Wednesday and Friday.  Stop warfarin, with last dose on April 29 for endoscopy scheduled for May 5.    Repeat INR in 1 week

## 2020-07-11 ENCOUNTER — Telehealth: Payer: Self-pay | Admitting: Gastroenterology

## 2020-07-11 NOTE — Telephone Encounter (Signed)
The pt states she has had worse abd pain over the past couple weeks.  She is due for LFTs and will come in tomorrow. She also is set up for EUS ERCP on 08/10/20 with Dr Ardis Hughs.  She wants to know what else she can do for the pain.

## 2020-07-11 NOTE — Telephone Encounter (Signed)
Patient was seen 06/15/20. Still in pain want a call back to discuss what can be done to decrease or ease pain.

## 2020-07-12 ENCOUNTER — Other Ambulatory Visit: Payer: Self-pay

## 2020-07-12 ENCOUNTER — Other Ambulatory Visit (INDEPENDENT_AMBULATORY_CARE_PROVIDER_SITE_OTHER): Payer: Medicare PPO

## 2020-07-12 ENCOUNTER — Inpatient Hospital Stay (HOSPITAL_COMMUNITY)
Admission: EM | Admit: 2020-07-12 | Discharge: 2020-07-15 | DRG: 394 | Disposition: A | Discharge status: Home / Self Care | Payer: Medicare PPO | Attending: Internal Medicine | Admitting: Internal Medicine

## 2020-07-12 DIAGNOSIS — E876 Hypokalemia: Secondary | ICD-10-CM | POA: Diagnosis present

## 2020-07-12 DIAGNOSIS — R1013 Epigastric pain: Secondary | ICD-10-CM | POA: Diagnosis not present

## 2020-07-12 DIAGNOSIS — E871 Hypo-osmolality and hyponatremia: Secondary | ICD-10-CM | POA: Diagnosis not present

## 2020-07-12 DIAGNOSIS — Z886 Allergy status to analgesic agent status: Secondary | ICD-10-CM | POA: Diagnosis not present

## 2020-07-12 DIAGNOSIS — R1010 Upper abdominal pain, unspecified: Secondary | ICD-10-CM

## 2020-07-12 DIAGNOSIS — Z7901 Long term (current) use of anticoagulants: Secondary | ICD-10-CM

## 2020-07-12 DIAGNOSIS — E860 Dehydration: Secondary | ICD-10-CM | POA: Diagnosis present

## 2020-07-12 DIAGNOSIS — R17 Unspecified jaundice: Secondary | ICD-10-CM | POA: Diagnosis not present

## 2020-07-12 DIAGNOSIS — Z6841 Body Mass Index (BMI) 40.0 and over, adult: Secondary | ICD-10-CM

## 2020-07-12 DIAGNOSIS — Y838 Other surgical procedures as the cause of abnormal reaction of the patient, or of later complication, without mention of misadventure at the time of the procedure: Secondary | ICD-10-CM | POA: Diagnosis present

## 2020-07-12 DIAGNOSIS — R739 Hyperglycemia, unspecified: Secondary | ICD-10-CM | POA: Diagnosis not present

## 2020-07-12 DIAGNOSIS — R9389 Abnormal findings on diagnostic imaging of other specified body structures: Secondary | ICD-10-CM

## 2020-07-12 DIAGNOSIS — Z20822 Contact with and (suspected) exposure to covid-19: Secondary | ICD-10-CM | POA: Diagnosis present

## 2020-07-12 DIAGNOSIS — I10 Essential (primary) hypertension: Secondary | ICD-10-CM | POA: Diagnosis not present

## 2020-07-12 DIAGNOSIS — K838 Other specified diseases of biliary tract: Secondary | ICD-10-CM | POA: Diagnosis not present

## 2020-07-12 DIAGNOSIS — Z79899 Other long term (current) drug therapy: Secondary | ICD-10-CM | POA: Diagnosis not present

## 2020-07-12 DIAGNOSIS — Z91013 Allergy to seafood: Secondary | ICD-10-CM | POA: Diagnosis not present

## 2020-07-12 DIAGNOSIS — Z803 Family history of malignant neoplasm of breast: Secondary | ICD-10-CM

## 2020-07-12 DIAGNOSIS — K8050 Calculus of bile duct without cholangitis or cholecystitis without obstruction: Secondary | ICD-10-CM

## 2020-07-12 DIAGNOSIS — Z880 Allergy status to penicillin: Secondary | ICD-10-CM

## 2020-07-12 DIAGNOSIS — E785 Hyperlipidemia, unspecified: Secondary | ICD-10-CM | POA: Diagnosis present

## 2020-07-12 DIAGNOSIS — I4891 Unspecified atrial fibrillation: Secondary | ICD-10-CM | POA: Diagnosis not present

## 2020-07-12 DIAGNOSIS — R948 Abnormal results of function studies of other organs and systems: Secondary | ICD-10-CM

## 2020-07-12 DIAGNOSIS — D689 Coagulation defect, unspecified: Secondary | ICD-10-CM | POA: Diagnosis not present

## 2020-07-12 DIAGNOSIS — R7989 Other specified abnormal findings of blood chemistry: Secondary | ICD-10-CM

## 2020-07-12 DIAGNOSIS — R1011 Right upper quadrant pain: Secondary | ICD-10-CM

## 2020-07-12 DIAGNOSIS — Z801 Family history of malignant neoplasm of trachea, bronchus and lung: Secondary | ICD-10-CM | POA: Diagnosis not present

## 2020-07-12 DIAGNOSIS — R791 Abnormal coagulation profile: Secondary | ICD-10-CM

## 2020-07-12 DIAGNOSIS — Z888 Allergy status to other drugs, medicaments and biological substances status: Secondary | ICD-10-CM

## 2020-07-12 DIAGNOSIS — E039 Hypothyroidism, unspecified: Secondary | ICD-10-CM | POA: Diagnosis not present

## 2020-07-12 DIAGNOSIS — R109 Unspecified abdominal pain: Secondary | ICD-10-CM

## 2020-07-12 DIAGNOSIS — I480 Paroxysmal atrial fibrillation: Secondary | ICD-10-CM | POA: Diagnosis present

## 2020-07-12 DIAGNOSIS — I129 Hypertensive chronic kidney disease with stage 1 through stage 4 chronic kidney disease, or unspecified chronic kidney disease: Secondary | ICD-10-CM | POA: Diagnosis not present

## 2020-07-12 DIAGNOSIS — K831 Obstruction of bile duct: Secondary | ICD-10-CM | POA: Diagnosis not present

## 2020-07-12 DIAGNOSIS — E669 Obesity, unspecified: Secondary | ICD-10-CM | POA: Diagnosis present

## 2020-07-12 DIAGNOSIS — Z833 Family history of diabetes mellitus: Secondary | ICD-10-CM

## 2020-07-12 DIAGNOSIS — Z9049 Acquired absence of other specified parts of digestive tract: Secondary | ICD-10-CM | POA: Diagnosis not present

## 2020-07-12 DIAGNOSIS — N189 Chronic kidney disease, unspecified: Secondary | ICD-10-CM | POA: Diagnosis not present

## 2020-07-12 DIAGNOSIS — K9186 Retained cholelithiasis following cholecystectomy: Secondary | ICD-10-CM | POA: Diagnosis not present

## 2020-07-12 DIAGNOSIS — Z91018 Allergy to other foods: Secondary | ICD-10-CM

## 2020-07-12 DIAGNOSIS — Z87891 Personal history of nicotine dependence: Secondary | ICD-10-CM

## 2020-07-12 HISTORY — DX: Other complications of anesthesia, initial encounter: T88.59XA

## 2020-07-12 LAB — COMPREHENSIVE METABOLIC PANEL
ALT: 81 U/L — ABNORMAL HIGH (ref 0–44)
AST: 142 U/L — ABNORMAL HIGH (ref 15–41)
Albumin: 2.7 g/dL — ABNORMAL LOW (ref 3.5–5.0)
Alkaline Phosphatase: 537 U/L — ABNORMAL HIGH (ref 38–126)
Anion gap: 8 (ref 5–15)
BUN: 18 mg/dL (ref 8–23)
CO2: 27 mmol/L (ref 22–32)
Calcium: 8.9 mg/dL (ref 8.9–10.3)
Chloride: 99 mmol/L (ref 98–111)
Creatinine, Ser: 1.42 mg/dL — ABNORMAL HIGH (ref 0.44–1.00)
GFR, Estimated: 39 mL/min — ABNORMAL LOW (ref >60)
Glucose, Bld: 115 mg/dL — ABNORMAL HIGH (ref 70–99)
Potassium: 3.3 mmol/L — ABNORMAL LOW (ref 3.5–5.1)
Sodium: 134 mmol/L — ABNORMAL LOW (ref 135–145)
Total Bilirubin: 15.2 mg/dL — ABNORMAL HIGH (ref 0.3–1.2)
Total Protein: 7.4 g/dL (ref 6.5–8.1)

## 2020-07-12 LAB — LIPASE, BLOOD
Lipase: 25 U/L (ref 11–51)
Lipase: 25 U/L (ref 11–51)

## 2020-07-12 LAB — CBC
HCT: 38.2 % (ref 36.0–46.0)
Hemoglobin: 13.1 g/dL (ref 12.0–15.0)
MCH: 30.7 pg (ref 26.0–34.0)
MCHC: 34.3 g/dL (ref 30.0–36.0)
MCV: 89.5 fL (ref 80.0–100.0)
Platelets: 247 10*3/uL (ref 150–400)
RBC: 4.27 MIL/uL (ref 3.87–5.11)
RDW: 16.8 % — ABNORMAL HIGH (ref 11.5–15.5)
WBC: 12.3 10*3/uL — ABNORMAL HIGH (ref 4.0–10.5)
nRBC: 0 % (ref 0.0–0.2)

## 2020-07-12 LAB — ACETAMINOPHEN LEVEL: Acetaminophen (Tylenol), Serum: <10 ug/mL — ABNORMAL LOW (ref 10–30)

## 2020-07-12 LAB — HEPATIC FUNCTION PANEL
ALT: 71 U/L — ABNORMAL HIGH (ref 0–35)
AST: 118 U/L — ABNORMAL HIGH (ref 0–37)
Albumin: 3.1 g/dL — ABNORMAL LOW (ref 3.5–5.2)
Alkaline Phosphatase: 554 U/L — ABNORMAL HIGH (ref 39–117)
Bilirubin, Direct: 8.4 mg/dL — ABNORMAL HIGH (ref 0.0–0.3)
Total Bilirubin: 13.7 mg/dL — ABNORMAL HIGH (ref 0.2–1.2)
Total Protein: 6.8 g/dL (ref 6.0–8.3)

## 2020-07-12 LAB — PROTIME-INR
INR: 6.2 (ref 0.8–1.2)
Prothrombin Time: 53.5 seconds — ABNORMAL HIGH (ref 11.4–15.2)

## 2020-07-12 LAB — RESP PANEL BY RT-PCR (FLU A&B, COVID) ARPGX2
Influenza A by PCR: NEGATIVE
Influenza B by PCR: NEGATIVE
SARS Coronavirus 2 by RT PCR: NEGATIVE

## 2020-07-12 MED ORDER — SODIUM CHLORIDE 0.9 % IV SOLN
2.0000 g | INTRAVENOUS | Status: DC
  Administered 2020-07-13 – 2020-07-14 (×3): 2 g via INTRAVENOUS
  Filled 2020-07-12: qty 2
  Filled 2020-07-12 (×3): qty 20

## 2020-07-12 MED ORDER — ONDANSETRON HCL 4 MG/2ML IJ SOLN: 4.0000 mg | Freq: Four times a day (QID) | INTRAMUSCULAR | Status: DC | PRN

## 2020-07-12 MED ORDER — LACTATED RINGERS IV SOLN: INTRAVENOUS | Status: DC

## 2020-07-12 MED ORDER — SODIUM CHLORIDE 0.9 % IV SOLN: 1.0000 g | INTRAVENOUS | Status: DC

## 2020-07-12 MED ORDER — LACTATED RINGERS IV BOLUS
1000.0000 mL | Freq: Once | INTRAVENOUS | Status: AC
  Administered 2020-07-12: 1000 mL via INTRAVENOUS

## 2020-07-12 MED ORDER — SODIUM CHLORIDE 0.9 % IV SOLN: 10.0000 mL/h | Freq: Once | INTRAVENOUS | Status: DC

## 2020-07-12 MED ORDER — ONDANSETRON HCL 4 MG PO TABS: 4.0000 mg | ORAL_TABLET | Freq: Four times a day (QID) | ORAL | Status: DC | PRN

## 2020-07-12 NOTE — ED Notes (Signed)
Warm blanket given to pt

## 2020-07-12 NOTE — ED Notes (Signed)
RN told Dr. Maryan Rued of pt's critical Protime-INR result of 6.2

## 2020-07-12 NOTE — ED Triage Notes (Signed)
Pt states PCP sent pt in due to abnormal labs. Pt states her potassium was low and something else was off. Pt noted to be yellow in color. Pt states she does not drink any alcohol. Pt states a liver problem, but does not recall what it is.

## 2020-07-12 NOTE — H&P (Signed)
History and Physical   [name] TWS:[id number] DOB: 10-31-1945 DOA: 07/12/2020  Referring MD/NP/PA: Dr. Maryan Rued  PCP: Kelton Pillar, MD   Outpatient Specialists: Dr. Edison Nasuti, gastroenterology  Patient coming from: Skilled nursing facility  Chief Complaint: Exertional dyspnea with orthopnea  HPI: [name] is a 75 y.o. female with history of paroxysmal atrial fibrillation, hypertension, hypothyroidism, hyperlipidemia who is status post cholecystectomy.  Patient has had 3 weeks of intermittent upper abdominal pain and worsening jaundice.  She was seen and evaluated 3 weeks ago by Dr. Ardis Hughs.  Her bilirubin was 5 and she has elevated LFTs at the time.  An ultrasound was done followed by MRI.  There was report of possible intrahepatic dilatation with mild dilatation of the extrahepatic duct as well.  No mass or stone noted.  Recommendation is for EUS.  She had a small nodule about 6 to 7 mm at the head of pancreas near the ampulla.  No pancreatic ductal dilatation.  High EUS as well as ERCP is currently scheduled for May 5 but patient presents today with worsening abdominal pain.  Also nausea but no vomiting.  She is not able to eat much.  Appetite is low with the nausea.  She is evaluated in the ER and is being admitted to the hospital for since she failed outpatient management so far.  ED Course: Temperature 98.2 blood pressure 140/59, pulse 48 respiratory 20 oxygen sat 97% room air.  PT 53.5 INR 6.2 lipase 25.  COVID-19 and influenza screen negative.  Sodium 134 potassium 3.3 chloride 99.  CO2 27 glucose 115.  Creatinine is 1.42.  Alkaline phosphatase 537 albumin 2.7 AST 142 ALT 81 total bilirubin is 15.2.  White count is 12.3 the rest of the CBC within normal.  Tylenol level less than 10.  Acute hepatitis panel recommended and is still pending.  Patient being admitted for further evaluation and treatment  Review of Systems: As per HPI otherwise 10 point review of systems negative.     Past Medical History:  Diagnosis Date  . Borderline hyperlipidemia   . Borderline hypertension   . Hypertension   . Hypothyroid    s/p PTU therapy  . PAF (paroxysmal atrial fibrillation) ([place])     Past Surgical History:  Procedure Laterality Date  . CESAREAN SECTION    . CHOLECYSTECTOMY    . KNEE SURGERY     right 2014, left 2015, arthroscipic  . LEG SURGERY     Left leg d/t  tumor (benign)  . THYROID SURGERY       reports that she quit smoking about 38 years ago. Her smoking use included cigarettes. She has a 4.00 pack-year smoking history. She has never used smokeless tobacco. She reports that she does not drink alcohol and does not use drugs.  Allergies  Allergen Reactions  . Chocolate Itching    Itching mouth  . Erythromycin     Other reaction(s): Unknown  . Other Itching    Strawberry , white potatoes, corn, beef, pork , soy, cat dander, fish  . Shellfish Allergy   . Naproxen Sodium Other (See Comments)    Hurt all over  . Penicillins Rash    Has patient had a PCN reaction causing immediate rash, facial/tongue/throat swelling, SOB or lightheadedness with hypotension: Yes Has patient had a PCN reaction causing severe rash involving mucus membranes or skin necrosis: Yes Has patient had a PCN reaction that required hospitalization: No Has patient had a PCN reaction occurring within the  last 10 years: No If all of the above answers are "NO", then may proceed with Cephalosporin use.   . Promethazine Hcl Other (See Comments)    Sick     Family History  Problem Relation Age of Onset  . Lung cancer Father   . Breast cancer Mother        in 80's  . Cirrhosis Brother   . Diabetes Brother   . Breast cancer Sister 15     Prior to Admission medications   Medication Sig Start Date End Date Taking? Authorizing Provider  flecainide (TAMBOCOR) 100 MG tablet Take 1 tablet by mouth twice daily 04/17/20   Minus Breeding, MD  metoprolol tartrate (LOPRESSOR) 50 MG  tablet Take 0.5 tablets (25 mg total) by mouth 2 (two) times daily. 06/09/19   Deberah Pelton, NP  omeprazole (PRILOSEC) 10 MG capsule Take 10 mg by mouth daily as needed (acid).     [provider]  warfarin (COUMADIN) 5 MG tablet TAKE 1/2 TO 1 (ONE-HALF TO ONE) TABLET BY MOUTH ONCE DAILY AS  DIRECTED  BY  COUMADIN  CLINIC 06/26/20   Minus Breeding, MD    Physical Exam: Vitals:   07/12/20 1953 07/12/20 2030 07/12/20 2100 07/12/20 2130  BP: 139/69 (!) 136/42 (!) 148/59 (!) 120/54  Pulse: (!) 51 (!) 50 (!) 53 (!) 48  Resp: 19 20 15 20   Temp:      TempSrc:      SpO2: 99% 99% 100% 97%  Weight:      Height:          Constitutional: Acutely ill looking no distress Vitals:   07/12/20 1953 07/12/20 2030 07/12/20 2100 07/12/20 2130  BP: 139/69 (!) 136/42 (!) 148/59 (!) 120/54  Pulse: (!) 51 (!) 50 (!) 53 (!) 48  Resp: 19 20 15 20   Temp:      TempSrc:      SpO2: 99% 99% 100% 97%  Weight:      Height:       Eyes: PERRL, lids and conjunctivae icteric ENMT: Mucous membranes are dry. Posterior pharynx clear of any exudate or lesions.Normal dentition.  Neck: normal, supple, no masses, no thyromegaly Respiratory: clear to auscultation bilaterally, no wheezing, no crackles. Normal respiratory effort. No accessory muscle use.  Cardiovascular: Sinus bradycardia no murmurs / rubs / gallops. No extremity edema. 2+ pedal pulses. No carotid bruits.  Abdomen: no tenderness, no masses palpated. No hepatosplenomegaly. Bowel sounds positive.  Musculoskeletal: no clubbing / cyanosis. No joint deformity upper and lower extremities. Good ROM, no contractures. Normal muscle tone.  Skin: Jaundiced, no rashes Neurologic: CN 2-12 grossly intact. Sensation intact, DTR normal. Strength 5/5 in all 4.  Psychiatric: Normal judgment and insight. Alert and oriented x 3. Normal mood.     Labs on Admission: I have personally reviewed following labs and imaging studies  CBC: Recent Labs  Lab  07/12/20 1815  WBC 12.3*  HGB 13.1  HCT 38.2  MCV 89.5  PLT 916   Basic Metabolic Panel: Recent Labs  Lab 07/12/20 1815  NA 134*  K 3.3*  CL 99  CO2 27  GLUCOSE 115*  BUN 18  CREATININE 1.42*  CALCIUM 8.9   GFR: Estimated Creatinine Clearance: 36.5 mL/min (A) (by C-G formula based on SCr of 1.42 mg/dL (H)). Liver Function Tests: Recent Labs  Lab 07/12/20 1041 07/12/20 1815  AST 118* 142*  ALT 71* 81*  ALKPHOS 554* 537*  BILITOT 13.7* 15.2*  PROT 6.8 7.4  ALBUMIN 3.1* 2.7*   Recent Labs  Lab 07/12/20 1815 07/12/20 2004  LIPASE 25 25   No results for input(s): AMMONIA in the last 168 hours. Coagulation Profile: Recent Labs  Lab 07/10/20 0825 07/12/20 2004  INR 6.0* 6.2*   Cardiac Enzymes: No results for input(s): CKTOTAL, CKMB, CKMBINDEX, TROPONINI in the last 168 hours. BNP (last 3 results) No results for input(s): PROBNP in the last 8760 hours. HbA1C: No results for input(s): HGBA1C in the last 72 hours. CBG: No results for input(s): GLUCAP in the last 168 hours. Lipid Profile: No results for input(s): CHOL, HDL, LDLCALC, TRIG, CHOLHDL, LDLDIRECT in the last 72 hours. Thyroid Function Tests: No results for input(s): TSH, T4TOTAL, FREET4, T3FREE, THYROIDAB in the last 72 hours. Anemia Panel: No results for input(s): VITAMINB12, FOLATE, FERRITIN, TIBC, IRON, RETICCTPCT in the last 72 hours. Urine analysis:    Component Value Date/Time   COLORURINE YELLOW 06/21/2018 1255   APPEARANCEUR HAZY (A) 06/21/2018 1255   LABSPEC 1.006 06/21/2018 1255   PHURINE 6.0 06/21/2018 1255   GLUCOSEU NEGATIVE 06/21/2018 1255   HGBUR SMALL (A) 06/21/2018 1255   BILIRUBINUR NEGATIVE 06/21/2018 1255   [place] 06/21/2018 1255   PROTEINUR 30 (A) 06/21/2018 1255   NITRITE NEGATIVE 06/21/2018 1255   LEUKOCYTESUR NEGATIVE 06/21/2018 1255   Sepsis Labs: @LABRCNTIP (procalcitonin:4,lacticidven:4) ) Recent Results (from the past 240 hour(s))  Resp Panel by  RT-PCR (Flu A&B, Covid) Nasopharyngeal Swab     Status: None   Collection Time: 07/12/20  8:09 PM   Specimen: Nasopharyngeal Swab; Nasopharyngeal(NP) swabs in vial transport medium  Result Value Ref Range Status   SARS Coronavirus 2 by RT PCR NEGATIVE NEGATIVE Final    Comment: (NOTE) SARS-CoV-2 target nucleic acids are NOT DETECTED.  The SARS-CoV-2 RNA is generally detectable in upper respiratory specimens during the acute phase of infection. The lowest concentration of SARS-CoV-2 viral copies this assay can detect is 138 copies/mL. A negative result does not preclude SARS-Cov-2 infection and should not be used as the sole basis for treatment or other patient management decisions. A negative result may occur with  improper specimen collection/handling, submission of specimen other than nasopharyngeal swab, presence of viral mutation(s) within the areas targeted by this assay, and inadequate number of viral copies(<138 copies/mL). A negative result must be combined with clinical observations, patient history, and epidemiological information. The expected result is Negative.  Fact Sheet for Patients:  EntrepreneurPulse.com.au  Fact Sheet for Healthcare Providers:  IncredibleEmployment.be  This test is no t yet approved or cleared by the [place] FDA and  has been authorized for detection and/or diagnosis of SARS-CoV-2 by FDA under an Emergency Use Authorization (EUA). This EUA will remain  in effect (meaning this test can be used) for the duration of the COVID-19 declaration under Section 564(b)(1) of the Act, 21 U.S.C.section 360bbb-3(b)(1), unless the authorization is terminated  or revoked sooner.       Influenza A by PCR NEGATIVE NEGATIVE Final   Influenza B by PCR NEGATIVE NEGATIVE Final    Comment: (NOTE) The Xpert Xpress SARS-CoV-2/FLU/RSV plus assay is intended as an aid in the diagnosis of influenza from Nasopharyngeal swab  specimens and should not be used as a sole basis for treatment. Nasal washings and aspirates are unacceptable for Xpert Xpress SARS-CoV-2/FLU/RSV testing.  Fact Sheet for Patients: EntrepreneurPulse.com.au  Fact Sheet for Healthcare Providers: IncredibleEmployment.be  This test is not yet approved or cleared by the [place] and has been authorized for  detection and/or diagnosis of SARS-CoV-2 by FDA under an Emergency Use Authorization (EUA). This EUA will remain in effect (meaning this test can be used) for the duration of the COVID-19 declaration under Section 564(b)(1) of the Act, 21 U.S.C. section 360bbb-3(b)(1), unless the authorization is terminated or revoked.  Performed at [facility], [place] [address] [place], [place] [address]      Radiological Exams on Admission: No results found.    Assessment/Plan Principal Problem:   Abdominal pain Active Problems:   Hypothyroidism   Obesity, unspecified   Essential hypertension   ATRIAL FIBRILLATION, PAROXYSMAL   Abdominal pain, epigastric   Hypokalemia   Hyponatremia   Coagulopathy (HCC)     #1 abdominal pain: Suspected due to lesion around the head of pancreas.  Patient scheduled for outpatient EUS and ERCP.  With symptoms worsening she is being admitted to the hospital.  GI to be consulted in the morning for further procedure and evaluation while in-house.  In the meantime she appears to be stable.  Continue pain management empiric antibiotics.  Patient to get acute hepatitis panel to rule out viral hepatitis.  Tylenol level negative.  Increased alkaline phosphatase indicates more obstructive nature.  #2 hypokalemia: Replete potassium  #3 coagulopathy: On warfarin as a result of A. fib.  Currently INR elevated.  Will use FFP to reverse the warfarin effect.  We will still consider vitamin K if necessary.  This will enable patient get EUS and ERCP tomorrow.  Follow  INR.  #4 hypothyroidism: Confirm on continue levothyroxine  #5 mild hyponatremia: Most likely dehydration.  Hydrate and monitor sodium level  #6 morbid obesity: Dietary counseling.  Continue to monitor  #7 essential hypertension: Confirm and continue with blood pressure medications  #8 paroxysmal atrial fibrillation: Currently in sinus rhythm.  We will hold warfarin due to coagulopathy.   DVT prophylaxis: SCD Code Status: Full code Family Communication: Husband at bedside Disposition Plan: Home after procedure Consults called: None but consult gastroenterology from [facility] Admission status: Inpatient  Severity of Illness: The appropriate patient status for this patient is INPATIENT. Inpatient status is judged to be reasonable and necessary in order to provide the required intensity of service to ensure the patient's safety. The patient's presenting symptoms, physical exam findings, and initial radiographic and laboratory data in the context of their chronic comorbidities is felt to place them at high risk for further clinical deterioration. Furthermore, it is not anticipated that the patient will be medically stable for discharge from the hospital within 2 midnights of admission. The following factors support the patient status of inpatient.   " The patient's presenting symptoms include abdominal pain nausea. " The worrisome physical exam findings include right upper quadrant abdominal tenderness. " The initial radiographic and laboratory data are worrisome because of hepatic damage. " The chronic co-morbidities include hypertension and atrial fibrillation.   * I certify that at the point of admission it is my clinical judgment that the patient will require inpatient hospital care spanning beyond 2 midnights from the point of admission due to high intensity of service, high risk for further deterioration and high frequency of surveillance required.Barbette Merino MD [facility] Pager [phone]  If 7PM-7AM, please contact night-coverage [email or web address] Password [email or web address]  07/12/2020, 11:39 PM

## 2020-07-12 NOTE — ED Provider Notes (Addendum)
[facility] EMERGENCY DEPARTMENT Provider Note   CSN: [id number] Arrival date & time: 07/12/20  1659     History Chief Complaint  Patient presents with  . Abnormal Labs    [name] is a 75 y.o. female.  Patient is a 75 year old female with a history of hypertension, paroxysmal atrial fibrillation on Coumadin, hypothyroid disease and prior cholecystectomy who is presenting today with 3 weeks of intermittent upper abdominal pain and worsening jaundice.  Patient was seen 3 weeks ago by Dr. Ardis Hughs with [facility] because of abdominal pain and dark urine.  At that time patient had a bilirubin of 5 and LFTs of 140 AST and 88 ALT.  She reports she had an ultrasound done and an MRI which did not provide the answer for her issues.  She initially also when this started 3 weeks ago had a temperature up to 102 but reports she continues to have intermittent fever.  The last 2 days in the evening she had a temperature up to 101.  She reports but they are usually gone when she wakes up the next morning.  She has had decreased appetite and sometimes eating does cause the pain to get worse.  She called the office 2 days ago and reported that she was having more abdominal pain and wondered what she could do to make it better.  They rechecked the labs and sent her to the emergency room today because her total bilirubin was worsening.  Patient currently reports she does not feel any different than she did 3 weeks ago but she does not feel well.  She has a EUS ERCP scheduled for May 5 with Dr. Ardis Hughs.  She is already had a right upper quadrant ultrasound that was normal.  MRI of the abdomen and pelvis showed mild intrahepatic biliary duct prominence with upper normal diameter of extrahepatic biliary ductal anatomy. No obstructing mass lesion or evidence for choledocholithiasis, and  6-7 mm enhancing nodule identified in the head of pancreas in the region of the ampulla., but noassociated  pancreatic ductal dilatation.  The history is provided by the patient.       Past Medical History:  Diagnosis Date  . Borderline hyperlipidemia   . Borderline hypertension   . Hypertension   . Hypothyroid    s/p PTU therapy  . PAF (paroxysmal atrial fibrillation) [facility])     Patient Active Problem List   Diagnosis Date Noted  . Abdominal pain, epigastric 06/15/2020  . Elevated LFTs 06/15/2020  . Abnormal finding on imaging 06/15/2020  . Educated about COVID-19 virus infection 05/04/2019  . Allergic asthma 06/12/2010  . 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 Perennial allergic rhinitis with seasonal variation 05/04/2010  . UNSPECIFIED TACHYCARDIA 12/19/2009  . OBESITY, UNSPECIFIED 09/16/2008  . HYPOTHYROIDISM 09/15/2008  . DYSLIPIDEMIA 09/15/2008  . Essential hypertension 09/15/2008  . ATRIAL FIBRILLATION, PAROXYSMAL 09/15/2008    Past Surgical History:  Procedure Laterality Date  . CESAREAN SECTION    . CHOLECYSTECTOMY    . KNEE SURGERY     right 2014, left 2015, arthroscipic  . LEG SURGERY     Left leg d/t  tumor (benign)  . THYROID SURGERY       OB History   No obstetric history on file.     Family History  Problem Relation Age of Onset  . Lung cancer Father   . Breast cancer Mother        in 25's  . Cirrhosis  Brother   . Diabetes Brother   . Breast cancer Sister 47    Social History   Tobacco Use  . Smoking status: Former Smoker    Packs/day: 0.80    Years: 5.00    Pack years: 4.00    Types: Cigarettes    Quit date: 04/08/1982    Years since quitting: 38.2  . Smokeless tobacco: Never Used  Substance Use Topics  . Alcohol use: No  . Drug use: No    Home Medications Prior to Admission medications   Medication Sig Start Date End Date Taking? Authorizing Provider  flecainide (TAMBOCOR) 100 MG tablet Take 1 tablet by mouth twice daily 04/17/20   Minus Breeding, MD  metoprolol tartrate (LOPRESSOR) 50 MG tablet Take 0.5 tablets  (25 mg total) by mouth 2 (two) times daily. 06/09/19   Deberah Pelton, NP  omeprazole (PRILOSEC) 10 MG capsule Take 10 mg by mouth daily as needed (acid).     [provider]  warfarin (COUMADIN) 5 MG tablet TAKE 1/2 TO 1 (ONE-HALF TO ONE) TABLET BY MOUTH ONCE DAILY AS  DIRECTED  BY  COUMADIN  CLINIC 06/26/20   Minus Breeding, MD    Allergies    Chocolate, Shellfish allergy, Naproxen sodium, Penicillins, and Promethazine hcl  Review of Systems   Review of Systems  All other systems reviewed and are negative.   Physical Exam Updated Vital Signs BP 139/69   Pulse (!) 51   Temp 98.2 F (36.8 C) (Oral)   Resp 19   Ht 4\' 11"  (1.499 m)   Wt 99.3 kg   SpO2 99%   BMI 44.23 kg/m   Physical Exam Vitals and nursing note reviewed.  Constitutional:      General: She is not in acute distress.    Appearance: She is well-developed.     Comments: jaundiced  HENT:     Head: Normocephalic and atraumatic.  Eyes:     General: Scleral icterus present.     Pupils: Pupils are equal, round, and reactive to light.  Cardiovascular:     Rate and Rhythm: Normal rate and regular rhythm.     Heart sounds: Normal heart sounds. No murmur heard. No friction rub.  Pulmonary:     Effort: Pulmonary effort is normal.     Breath sounds: Normal breath sounds. No wheezing or rales.  Abdominal:     General: Bowel sounds are normal. There is no distension.     Palpations: Abdomen is soft.     Tenderness: There is abdominal tenderness. There is no guarding or rebound.     Comments: Epigastric tenderness with palpation  Musculoskeletal:        General: No tenderness. Normal range of motion.     Right lower leg: No edema.     Left lower leg: No edema.     Comments: No edema  Skin:    General: Skin is warm and dry.     Findings: No rash.  Neurological:     Mental Status: She is alert and oriented to person, place, and time.     Cranial Nerves: No cranial nerve deficit.  Psychiatric:         Mood and Affect: Mood normal.        Behavior: Behavior normal.        Thought Content: Thought content normal.     ED Results / Procedures / Treatments   Labs (all labs ordered are listed, but only abnormal results are displayed)  Labs Reviewed  CBC - Abnormal; Notable for the following components:      Result Value   WBC 12.3 (*)    RDW 16.8 (*)    All other components within normal limits  COMPREHENSIVE METABOLIC PANEL - Abnormal; Notable for the following components:   Sodium 134 (*)    Potassium 3.3 (*)    Glucose, Bld 115 (*)    Creatinine, Ser 1.42 (*)    Albumin 2.7 (*)    AST 142 (*)    ALT 81 (*)    Alkaline Phosphatase 537 (*)    Total Bilirubin 15.2 (*)    GFR, Estimated 39 (*)    All other components within normal limits  RESP PANEL BY RT-PCR (FLU A&B, COVID) ARPGX2  LIPASE, BLOOD  PROTIME-INR  LIPASE, BLOOD    EKG EKG Interpretation  Date/Time:  Wednesday July 12 2020 18:13:14 EDT Ventricular Rate:  66 PR Interval:  186 QRS Duration: 96 QT Interval:  450 QTC Calculation: 471 R Axis:   -2 Text Interpretation: Normal sinus rhythm Left ventricular hypertrophy with repolarization abnormality ( R in aVL , Cornell product , Romhilt-Estes ) No significant change since last tracing Confirmed by Blanchie Dessert [phone]) on 07/12/2020 7:21:48 PM   Radiology No results found.  Procedures Procedures   Medications Ordered in ED Medications - No data to display  ED Course  I have reviewed the triage vital signs and the nursing notes.  Pertinent labs & imaging results that were available during my care of the patient were reviewed by me and considered in my medical decision making (see chart for details).    MDM Rules/Calculators/A&P                          Patient here for worsening abdominal pain and total bilirubin.  Patient's bilirubin is now up to 15 from 5 3 weeks ago.  She continues to have upper abdominal pain and has thus far had a normal  ultrasound and MRI with inconclusive results.  She is scheduled to have a EUS ERCP for further investigation but after her abnormal labs the office sent her to the emergency room.  Patient denies any vomiting.  She has been eating a lot less.  Creatinine has increased from 1.2-1.4 but is most likely due to poor oral intake.  She continues to have intermittent fever and has a mild leukocytosis of 12.  LFTs have not significantly changed.  Platelets are normal.  Patient has not had her Coumadin in the last 3 days because the last INR was 6.  We will recheck that today.  Will discuss with GI and give patient 1 L of IV fluids.  8:38 PM Spoke with Dr. Carlean Purl and he recommends admission for test tomorrow as outpt testing taking too long.  Pt also covered with rocephin due to intermittent fevers.  9:59 PM INR is 6.2.  Will treat with FFP. MDM Number of Diagnoses or Management Options   Amount and/or Complexity of Data Reviewed Clinical lab tests: ordered and reviewed Independent visualization of images, tracings, or specimens: yes   CRITICAL CARE Performed by: Braylon Lemmons Total critical care time: 30 minutes Critical care time was exclusive of separately billable procedures and treating other patients. Critical care was necessary to treat or prevent imminent or life-threatening deterioration. Critical care was time spent personally by me on the following activities: development of treatment plan with patient and/or surrogate as well as nursing,  discussions with consultants, evaluation of patient's response to treatment, examination of patient, obtaining history from patient or surrogate, ordering and performing treatments and interventions, ordering and review of laboratory studies, ordering and review of radiographic studies, pulse oximetry and re-evaluation of patient's condition.    Final Clinical Impression(s) / ED Diagnoses Final diagnoses:  Hyperbilirubinemia  Pain of upper abdomen   Supratherapeutic INR    Rx / DC Orders ED Discharge Orders    None       Blanchie Dessert, MD 07/12/20 2201    Blanchie Dessert, MD 07/12/20 2214

## 2020-07-13 ENCOUNTER — Encounter (HOSPITAL_COMMUNITY): Payer: Self-pay | Admitting: Internal Medicine

## 2020-07-13 ENCOUNTER — Other Ambulatory Visit: Payer: Self-pay

## 2020-07-13 DIAGNOSIS — R1013 Epigastric pain: Secondary | ICD-10-CM

## 2020-07-13 DIAGNOSIS — R17 Unspecified jaundice: Secondary | ICD-10-CM

## 2020-07-13 LAB — COMPREHENSIVE METABOLIC PANEL
ALT: 73 U/L — ABNORMAL HIGH (ref 0–44)
AST: 127 U/L — ABNORMAL HIGH (ref 15–41)
Albumin: 2.4 g/dL — ABNORMAL LOW (ref 3.5–5.0)
Alkaline Phosphatase: 455 U/L — ABNORMAL HIGH (ref 38–126)
Anion gap: 8 (ref 5–15)
BUN: 18 mg/dL (ref 8–23)
CO2: 26 mmol/L (ref 22–32)
Calcium: 8.7 mg/dL — ABNORMAL LOW (ref 8.9–10.3)
Chloride: 100 mmol/L (ref 98–111)
Creatinine, Ser: 1.35 mg/dL — ABNORMAL HIGH (ref 0.44–1.00)
GFR, Estimated: 41 mL/min — ABNORMAL LOW (ref >60)
Glucose, Bld: 132 mg/dL — ABNORMAL HIGH (ref 70–99)
Potassium: 3.2 mmol/L — ABNORMAL LOW (ref 3.5–5.1)
Sodium: 134 mmol/L — ABNORMAL LOW (ref 135–145)
Total Bilirubin: 12.7 mg/dL — ABNORMAL HIGH (ref 0.3–1.2)
Total Protein: 6.6 g/dL (ref 6.5–8.1)

## 2020-07-13 LAB — CBC
HCT: 34.6 % — ABNORMAL LOW (ref 36.0–46.0)
Hemoglobin: 11.9 g/dL — ABNORMAL LOW (ref 12.0–15.0)
MCH: 30.4 pg (ref 26.0–34.0)
MCHC: 34.4 g/dL (ref 30.0–36.0)
MCV: 88.5 fL (ref 80.0–100.0)
Platelets: 241 10*3/uL (ref 150–400)
RBC: 3.91 MIL/uL (ref 3.87–5.11)
RDW: 16.6 % — ABNORMAL HIGH (ref 11.5–15.5)
WBC: 13.3 10*3/uL — ABNORMAL HIGH (ref 4.0–10.5)
nRBC: 0 % (ref 0.0–0.2)

## 2020-07-13 LAB — PROTIME-INR
INR: 2.5 — ABNORMAL HIGH (ref 0.8–1.2)
Prothrombin Time: 26.3 seconds — ABNORMAL HIGH (ref 11.4–15.2)

## 2020-07-13 LAB — HEPATITIS PANEL, ACUTE
HCV Ab: NONREACTIVE
Hep A IgM: NONREACTIVE
Hep B C IgM: NONREACTIVE
Hepatitis B Surface Ag: NONREACTIVE

## 2020-07-13 LAB — MAGNESIUM: Magnesium: 1.9 mg/dL (ref 1.7–2.4)

## 2020-07-13 LAB — ABO/RH: ABO/RH(D): O NEG

## 2020-07-13 MED ORDER — POTASSIUM CHLORIDE CRYS ER 20 MEQ PO TBCR
40.0000 meq | EXTENDED_RELEASE_TABLET | Freq: Once | ORAL | Status: AC
  Administered 2020-07-13: 40 meq via ORAL
  Filled 2020-07-13: qty 2

## 2020-07-13 MED ORDER — METOPROLOL TARTRATE 25 MG PO TABS
25.0000 mg | ORAL_TABLET | Freq: Two times a day (BID) | ORAL | Status: DC
  Administered 2020-07-13 – 2020-07-14 (×3): 25 mg via ORAL
  Filled 2020-07-13 (×3): qty 1

## 2020-07-13 MED ORDER — FLECAINIDE ACETATE 50 MG PO TABS
100.0000 mg | ORAL_TABLET | Freq: Two times a day (BID) | ORAL | Status: DC
  Administered 2020-07-13 – 2020-07-15 (×5): 100 mg via ORAL
  Filled 2020-07-13: qty 1
  Filled 2020-07-13 (×2): qty 2
  Filled 2020-07-13: qty 1
  Filled 2020-07-13 (×3): qty 2

## 2020-07-13 MED ORDER — SODIUM CHLORIDE 0.9 % IV SOLN
INTRAVENOUS | Status: DC | PRN
  Administered 2020-07-13: 250 mL via INTRAVENOUS

## 2020-07-13 MED ORDER — INDOMETHACIN 50 MG RE SUPP
100.0000 mg | Freq: Once | RECTAL | Status: AC
  Administered 2020-07-14: 100 mg via RECTAL
  Filled 2020-07-13: qty 2

## 2020-07-13 NOTE — Progress Notes (Signed)
Progress Note    [name]  ZOX:[id number] DOB: April 06, 1946  DOA: 07/12/2020 PCP: Kelton Pillar, MD    Brief Narrative:     Medical records reviewed and are as summarized below:  [name] is an 75 y.o. female with history of paroxysmal atrial fibrillation, hypertension, hypothyroidism, hyperlipidemia who is status post cholecystectomy.  Patient has had 3 weeks of intermittent upper abdominal pain and worsening jaundice.  She was seen and evaluated 3 weeks ago by Dr. Ardis Hughs.  Her bilirubin was 5 and she has elevated LFTs at the time.  An ultrasound was done followed by MRI.  There was report of possible intrahepatic dilatation with mild dilatation of the extrahepatic duct as well.  No mass or stone noted.  Recommendation is for EUS.  She had a small nodule about 6 to 7 mm at the head of pancreas near the ampulla.  No pancreatic ductal dilatation.  High EUS as well as ERCP is currently scheduled for May 5 but patient presents today with worsening abdominal pain.     Assessment/Plan:   Principal Problem:   Abdominal pain Active Problems:   Hypothyroidism   Obesity, unspecified   Essential hypertension   ATRIAL FIBRILLATION, PAROXYSMAL   Abdominal pain, epigastric   Hypokalemia   Hyponatremia   Coagulopathy (HCC)   abdominal pain with abnormal LFTS:  - Patient scheduled for outpatient EUS and ERCP.  With symptoms worsening she is being admitted to the hospital.  -GI consult appreciated  hypokalemia:  -Replete potassium  coagulopathy:  -On warfarin for A. fib.   -Currently INR elevated.   - FFP to reverse the warfarin effect.   -await INR recheck -will do benefits check for eliquis  hypothyroidism:  -continue levothyroxine   mild hyponatremia:  -replete  essential hypertension:  -continue with home blood pressure medications   paroxysmal atrial fibrillation: Currently in sinus rhythm.  - hold warfarin due to coagulopathy -s/p FFP-- await INR  recheck  Morbid obesity Body mass index is 44.23 kg/m.   Family Communication/Anticipated D/C date and plan/Code Status   DVT prophylaxis: scd- -INR elevated and procedure planned Code Status: Full Code.  Family Communication: at bedside Disposition Plan: Status is: Inpatient  Remains inpatient appropriate because:Inpatient level of care appropriate due to severity of illness   Dispo: The patient is from: Home              Anticipated d/c is to: Home              Patient currently is not medically stable to d/c.   Difficult to place patient No         Medical Consultants:    GI     Subjective:   No SOB, still c/o nausea  Objective:    Vitals:   07/13/20 0737 07/13/20 0800 07/13/20 0830 07/13/20 0900  BP: (!) 156/58 (!) 164/55 (!) 152/53 140/60  Pulse: (!) 56 (!) 55 (!) 54 (!) 51  Resp: 14     Temp: 98 F (36.7 C)     TempSrc: Oral     SpO2: 98% 96% 95% 94%  Weight:      Height:        Intake/Output Summary (Last 24 hours) at 07/13/2020 1038 Last data filed at 07/12/2020 2203 Gross per 24 hour  Intake 1000 ml  Output --  Net 1000 ml   Filed Weights   07/12/20 1811  Weight: 99.3 kg    Exam:  General: Appearance:  Severely obese female in no acute distress     Lungs:      respirations unlabored  Heart:    Bradycardic. regular   MS:   All extremities are intact.   Neurologic:   Awake, alert, oriented x 3. No apparent focal neurological           defect.     Data Reviewed:   I have personally reviewed following labs and imaging studies:  Labs: Labs show the following:   Basic Metabolic Panel: Recent Labs  Lab 07/12/20 1815 07/13/20 0242  NA 134* 134*  K 3.3* 3.2*  CL 99 100  CO2 27 26  GLUCOSE 115* 132*  BUN 18 18  CREATININE 1.42* 1.35*  CALCIUM 8.9 8.7*   GFR Estimated Creatinine Clearance: 38.4 mL/min (A) (by C-G formula based on SCr of 1.35 mg/dL (H)). Liver Function Tests: Recent Labs  Lab 07/12/20 1041  07/12/20 1815 07/13/20 0242  AST 118* 142* 127*  ALT 71* 81* 73*  ALKPHOS 554* 537* 455*  BILITOT 13.7* 15.2* 12.7*  PROT 6.8 7.4 6.6  ALBUMIN 3.1* 2.7* 2.4*   Recent Labs  Lab 07/12/20 1815 07/12/20 2004  LIPASE 25 25   No results for input(s): AMMONIA in the last 168 hours. Coagulation profile Recent Labs  Lab 07/10/20 0825 07/12/20 2004  INR 6.0* 6.2*    CBC: Recent Labs  Lab 07/12/20 1815 07/13/20 0242  WBC 12.3* 13.3*  HGB 13.1 11.9*  HCT 38.2 34.6*  MCV 89.5 88.5  PLT 247 241   Cardiac Enzymes: No results for input(s): CKTOTAL, CKMB, CKMBINDEX, TROPONINI in the last 168 hours. BNP (last 3 results) No results for input(s): PROBNP in the last 8760 hours. CBG: No results for input(s): GLUCAP in the last 168 hours. D-Dimer: No results for input(s): DDIMER in the last 72 hours. Hgb A1c: No results for input(s): HGBA1C in the last 72 hours. Lipid Profile: No results for input(s): CHOL, HDL, LDLCALC, TRIG, CHOLHDL, LDLDIRECT in the last 72 hours. Thyroid function studies: No results for input(s): TSH, T4TOTAL, T3FREE, THYROIDAB in the last 72 hours.  Invalid input(s): FREET3 Anemia work up: No results for input(s): VITAMINB12, FOLATE, FERRITIN, TIBC, IRON, RETICCTPCT in the last 72 hours. Sepsis Labs: Recent Labs  Lab 07/12/20 1815 07/13/20 0242  WBC 12.3* 13.3*    Microbiology Recent Results (from the past 240 hour(s))  Resp Panel by RT-PCR (Flu A&B, Covid) Nasopharyngeal Swab     Status: None   Collection Time: 07/12/20  8:09 PM   Specimen: Nasopharyngeal Swab; Nasopharyngeal(NP) swabs in vial transport medium  Result Value Ref Range Status   SARS Coronavirus 2 by RT PCR NEGATIVE NEGATIVE Final    Comment: (NOTE) SARS-CoV-2 target nucleic acids are NOT DETECTED.  The SARS-CoV-2 RNA is generally detectable in upper respiratory specimens during the acute phase of infection. The lowest concentration of SARS-CoV-2 viral copies this assay can  detect is 138 copies/mL. A negative result does not preclude SARS-Cov-2 infection and should not be used as the sole basis for treatment or other patient management decisions. A negative result may occur with  improper specimen collection/handling, submission of specimen other than nasopharyngeal swab, presence of viral mutation(s) within the areas targeted by this assay, and inadequate number of viral copies(<138 copies/mL). A negative result must be combined with clinical observations, patient history, and epidemiological information. The expected result is Negative.  Fact Sheet for Patients:  EntrepreneurPulse.com.au  Fact Sheet for Healthcare Providers:  IncredibleEmployment.be  This test  is no t yet approved or cleared by the [place] and  has been authorized for detection and/or diagnosis of SARS-CoV-2 by FDA under an Emergency Use Authorization (EUA). This EUA will remain  in effect (meaning this test can be used) for the duration of the COVID-19 declaration under Section 564(b)(1) of the Act, 21 U.S.C.section 360bbb-3(b)(1), unless the authorization is terminated  or revoked sooner.       Influenza A by PCR NEGATIVE NEGATIVE Final   Influenza B by PCR NEGATIVE NEGATIVE Final    Comment: (NOTE) The Xpert Xpress SARS-CoV-2/FLU/RSV plus assay is intended as an aid in the diagnosis of influenza from Nasopharyngeal swab specimens and should not be used as a sole basis for treatment. Nasal washings and aspirates are unacceptable for Xpert Xpress SARS-CoV-2/FLU/RSV testing.  Fact Sheet for Patients: EntrepreneurPulse.com.au  Fact Sheet for Healthcare Providers: IncredibleEmployment.be  This test is not yet approved or cleared by the [place] FDA and has been authorized for detection and/or diagnosis of SARS-CoV-2 by FDA under an Emergency Use Authorization (EUA). This EUA will remain in  effect (meaning this test can be used) for the duration of the COVID-19 declaration under Section 564(b)(1) of the Act, 21 U.S.C. section 360bbb-3(b)(1), unless the authorization is terminated or revoked.  Performed at [facility], [place] [address], [place], [place] [address]     Procedures and diagnostic studies:  No results found.  Medications:   . flecainide  100 mg Oral BID  . metoprolol tartrate  25 mg Oral BID   Continuous Infusions: . cefTRIAXone (ROCEPHIN)  IV Stopped (07/13/20 9833)  . lactated ringers 100 mL/hr at 07/13/20 0612     LOS: 1 day   Geradine Girt  [facility]   How to contact the [facility] Attending or Consulting provider [place] or covering provider during after hours [place], for this patient?  1. Check the care team in [facility] and look for a) attending/consulting [facility] provider listed and b) the [facility] team listed 2. Log into [email or web address] and use [place]'s universal password to access. If you do not have the password, please contact the hospital operator. 3. Locate the [facility] provider you are looking for under [facility] and page to a number that you can be directly reached. 4. If you still have difficulty reaching the provider, please page the [facility] (Director on Call) for the Hospitalists listed on amion for assistance.  07/13/2020, 10:38 AM

## 2020-07-13 NOTE — H&P (View-Only) (Signed)
[facility] Consult: 8:19 AM 07/13/2020  LOS: 1 day    Referring Provider: Dr Eliseo Squires  Primary Care Physician:  Kelton Pillar, MD Primary Gastroenterologist:  Dr. Loletha Carrow     Reason for Consultation: Elevated LFTs.  Nominal pain.   HPI: [name] is a 75 y.o. female.  PMH HTN.  HLD.  A. fib on chronic Coumadin.  PTU therapy to thyroid.  Cholecystectomy.  C-section.  Abdominal hysterectomy with unilateral oophorectomy for what sounds like fibroids.   Abnormal LFTs, particularly alk phos noted in Feb 2022.  Trend of LFTs since early March are  T bili 1.2 >> 5.6 >> 15.2 yesterday >> 12.7 today. Alkaline phosphatase 363 >> 554 >> 455 AST/ALT 47/54 >> 147/88 >> 127/41 APAP level less than 10. Hepatitis A IgM nonreactive.  Hepatitis B surface Ag nonreactive.  Hepatitis B core IgM nonreactive.  HCV antibody nonreactive. Ceruloplasmin normal.  IgG normal.  A1 AT elevated 240.  ANA positive with cytoplasmic pattern, 1:80 titer.  Mitochondrial antibody 20 which is in normal range.  IgG 1460 WBCs 11.3 >> 12.3 over 2 weeks INR is 6.2 today.  Hb 11.9. 05/11/2020 abdominal ultrasound: Gallbladder surgically absent.  3.9 mm CBD.  Normal liver.  Normal portal vein.  Normal spleen.  Cysts in the left kidney.  No explanation for abnormal LFTs. 05/25/2020 MRI abdomen/pelvis: Mild intrahepatic biliary ductal prominence.  Upper normal diameter of extrahepatic biliary duct.  No obstructing mass, lesion or choledocholithiasis.  6.7 mm nodule at head of pancreas/ampullary region.  PD normal.  Tiny foci in both kidneys, too small to characterize but likely benign.  Patient has 08/10/2020 for EUS/ERCP w Dr. Ardis Hughs Abdominal pain progressing over the past few weeks.  Labs in last few days show rising LFTs.  She has developed jaundice,  anorexia.  Based on rising LFTs she was sent to the ER for admission. Abdominal pain is epigastric/RUQ.  It comes and goes.  Can last for 2 to 3 days and then she may go a week without pain.  Pain has become more frequent but not more intense.  Certain foods seem to trigger the pain, dairy and spicy foods in particular.  Pain accompanied by nonbloody nausea, vomiting.  For several days patient has noted jaundice.  Feels her abdominal girth has enlarged slightly and she is not noting weight loss. This past Monday, 4 days ago, her INR was 6.  Coumadin clinic advised her to hold Coumadin for 3 days so the last dose of Coumadin was on Sunday  Received FFP x 2 this morning Normotensive, heart rate in the 50s.  No fevers.  Room air sats well into the 90s.  Does not consume alcoholic beverages.  No history illicit/IV drug use. Family history pertinent for a brother who had cirrhosis.  Breast cancer in mother, sister.  Lung cancer in father.  Diabetes in brother. This with her husband in [place].  Past Medical History:  Diagnosis Date  . Borderline hyperlipidemia   . Borderline hypertension   . Hypertension   . Hypothyroid  s/p PTU therapy  . PAF (paroxysmal atrial fibrillation) ([place])     Past Surgical History:  Procedure Laterality Date  . CESAREAN SECTION    . CHOLECYSTECTOMY    . KNEE SURGERY     right 2014, left 2015, arthroscipic  . LEG SURGERY     Left leg d/t  tumor (benign)  . THYROID SURGERY      Prior to Admission medications   Medication Sig Start Date End Date Taking? Authorizing Provider  flecainide (TAMBOCOR) 100 MG tablet Take 1 tablet by mouth twice daily Patient taking differently: Take 100 mg by mouth 2 (two) times daily. 04/17/20  Yes Minus Breeding, MD  metoprolol tartrate (LOPRESSOR) 50 MG tablet Take 0.5 tablets (25 mg total) by mouth 2 (two) times daily. 06/09/19  Yes Cleaver, Jossie Ng, NP  omeprazole (PRILOSEC) 10 MG capsule Take 10 mg by mouth daily as needed  (acid).    Yes [provider]  warfarin (COUMADIN) 5 MG tablet TAKE 1/2 TO 1 (ONE-HALF TO ONE) TABLET BY MOUTH ONCE DAILY AS  DIRECTED  BY  COUMADIN  CLINIC Patient taking differently: Take 2.5-5 mg by mouth See admin instructions. Takes 2.5 mg  on Mondays , Wednesdays and Fridays and 5 mg on all other days 06/26/20  Yes Minus Breeding, MD    Scheduled Meds: . potassium chloride  40 mEq Oral Once   Infusions: . cefTRIAXone (ROCEPHIN)  IV Stopped (07/13/20 9767)  . lactated ringers 100 mL/hr at 07/13/20 0612   PRN Meds: ondansetron **OR** ondansetron (ZOFRAN) IV   Allergies as of 07/12/2020 - Review Complete 07/12/2020  Allergen Reaction Noted  . Chocolate Itching 06/09/2019  . Erythromycin  05/09/2020  . Other Itching 07/12/2020  . Shellfish allergy  07/12/2020  . Naproxen sodium Other (See Comments)   . Penicillins Rash   . Promethazine hcl Other (See Comments)     Family History  Problem Relation Age of Onset  . Lung cancer Father   . Breast cancer Mother        in 37's  . Cirrhosis Brother   . Diabetes Brother   . Breast cancer Sister 68    Social History   Socioeconomic History  . Marital status: Married    Spouse name: Not on file  . Number of children: Not on file  . Years of education: Not on file  . Highest education level: Not on file  Occupational History  . Not on file  Tobacco Use  . Smoking status: Former Smoker    Packs/day: 0.80    Years: 5.00    Pack years: 4.00    Types: Cigarettes    Quit date: 04/08/1982    Years since quitting: 38.2  . Smokeless tobacco: Never Used  Substance and Sexual Activity  . Alcohol use: No  . Drug use: No  . Sexual activity: Not on file  Other Topics Concern  . Not on file  Social History Narrative  . Not on file   Social Determinants of Health   Financial Resource Strain: Not on file  Food Insecurity: Not on file  Transportation Needs: Not on file  Physical Activity: Not on file  Stress: Not  on file  Social Connections: Not on file  Intimate Partner Violence: Not on file    REVIEW OF SYSTEMS: Constitutional: Feels a bit tired but no profound weakness. ENT:  No nose bleeds Pulm: No shortness of breath or cough. CV:  No palpitations, no LE edema.  No angina  GU:  No hematuria, no frequency GI: See HPI. Heme: Denies unusual or excessive bleeding or bruising. Transfusions: None but did get FFP early this morning Neuro:  No headaches, no peripheral tingling or numbness.  No seizures, no syncope. Derm:  No itching, no rash or sores.  Endocrine:  No sweats or chills.  No polyuria or dysuria Immunization: Not vaccinated for COVID-19.   PHYSICAL EXAM: Vital signs in last 24 hours: Vitals:   07/13/20 0630 07/13/20 0737  BP: (!) 169/59 (!) 156/58  Pulse: (!) 55 (!) 56  Resp: 20 14  Temp:  98 F (36.7 C)  SpO2: 96% 98%   Wt Readings from Last 3 Encounters:  07/12/20 99.3 kg  07/02/20 99.3 kg  06/15/20 99.3 kg    General: Patient is obese, looks somewhat chronically ill.  Comfortable, alert. Head: No facial asymmetry or swelling.  No signs of head trauma. Eyes: Sclera are icteric.  Conjunctiva pink. Ears: Not hard of hearing Nose: No congestion or discharge Mouth: Mucosa is moist, pink, clear.  Tongue midline. Neck: No mass, no thyromegaly, no JVD Lungs: No labored breathing or cough.  Lungs clear bilaterally Heart: RRR.  No MRG.  S1, S2 present Abdomen: Soft.  Tender in the right upper quadrant and epigastrium without guarding or rebound.  No bruits, hernias, masses.  Active bowel sounds..   Rectal: Deferred Musc/Skeltl: No joint redness, swelling or gross deformity. Extremities: No CCE. Neurologic: Oriented x3.  Moves all 4 limbs.  No tremor, no gross deficits or weakness. Skin: Mild, subtle jaundice. Tattoos: None observed Nodes: No cervical adenopathy Psych: Pleasant, calm, cooperative.  LAB RESULTS: Recent Labs    07/12/20 1815 07/13/20 0242  WBC 12.3*  13.3*  HGB 13.1 11.9*  HCT 38.2 34.6*  PLT 247 241   BMET Lab Results  Component Value Date   NA 134 (L) 07/13/2020   NA 134 (L) 07/12/2020   NA 136 07/02/2020   K 3.2 (L) 07/13/2020   K 3.3 (L) 07/12/2020   K 3.4 (L) 07/02/2020   CL 100 07/13/2020   CL 99 07/12/2020   CL 102 07/02/2020   CO2 26 07/13/2020   CO2 27 07/12/2020   CO2 25 07/02/2020   GLUCOSE 132 (H) 07/13/2020   GLUCOSE 115 (H) 07/12/2020   GLUCOSE 116 (H) 07/02/2020   BUN 18 07/13/2020   BUN 18 07/12/2020   BUN 13 07/02/2020   CREATININE 1.35 (H) 07/13/2020   CREATININE 1.42 (H) 07/12/2020   CREATININE 1.28 (H) 07/02/2020   CALCIUM 8.7 (L) 07/13/2020   CALCIUM 8.9 07/12/2020   CALCIUM 9.0 07/02/2020   LFT Recent Labs    07/12/20 1041 07/12/20 1815 07/13/20 0242  PROT 6.8 7.4 6.6  ALBUMIN 3.1* 2.7* 2.4*  AST 118* 142* 127*  ALT 71* 81* 73*  ALKPHOS 554* 537* 455*  BILITOT 13.7* 15.2* 12.7*  BILIDIR 8.4*  --   --    PT/INR Lab Results  Component Value Date   INR 6.2 (HH) 07/12/2020   INR 6.0 (A) 07/10/2020   INR 1.4 (A) 06/26/2020   Hepatitis Panel Recent Labs    07/13/20 0242  HEPBSAG NON REACTIVE  HCVAB NON REACTIVE  HEPAIGM NON REACTIVE  HEPBIGM NON REACTIVE   Lipase     Component Value Date/Time   LIPASE 25 07/12/2020 2004      IMPRESSION:   *   Abdominal pain, rising LFTs, particularly alkaline phosphatase.  Mild intrahepatic biliary ductal prominence and upper limits of normal common  bile duct, nodule at the ampulla/head of pancreas on MRI in February *   Chronic Coumadin.  Hx PAF Coagulopathy with INR 6.2.  Received FFP x 2 very early this morning and repeat INR ordered.  *   Hypokalemia.  *    CKD.  *    mild hyperglycemia.  No previous diagnosis of diabetes    PLAN:     *  EUS/ERCP. this is set up for 1 PM tomorrow.  *    CA 19-9 ordered  *   Await INR fup day and will recheck early tomorrow morning and give additional FFP if needed.  *    Ordered  regular diet.  *   Note she has orders for Rocephin daily, will discuss with MD whether or not this is necessary.  Definitely needs a dose of antibiotic prior to tomorrow's procedures.   Azucena Freed  07/13/2020, 8:19 AM Phone [phone]  ________________________________________________________________________  Velora Heckler GI MD note:  I personally examined the patient, reviewed the data and agree with the assessment and plan described above.  She's had mild abd pains, nausea, anorexia and 15-20 pound weight loss, now jaundice. Remote complicated lap chole with post op bile leak, treated with ERCP, stenting seems unlikely to be contributing.  CT suggests a 'nodule' in her pancreatic head, obviously this raises concern for neoplasm.  I am planning EUS and ERCP tomorrow after further INR reversal (was 6s today).  Owens Loffler, MD [facility] Pager [phone]

## 2020-07-13 NOTE — Anesthesia Preprocedure Evaluation (Addendum)
Anesthesia Evaluation  Patient identified by MRN, date of birth, ID band Patient awake    Reviewed: Allergy & Precautions, NPO status , Patient's Chart, lab work & pertinent test results  History of Anesthesia Complications (+) history of anesthetic complications  Airway Mallampati: II  TM Distance: >3 FB Neck ROM: Full    Dental no notable dental hx. (+) Upper Dentures, Edentulous Lower   Pulmonary asthma , former smoker,    Pulmonary exam normal breath sounds clear to auscultation       Cardiovascular hypertension, Pt. on medications and Pt. on home beta blockers Normal cardiovascular exam+ dysrhythmias Atrial Fibrillation  Rhythm:Irregular Rate:Abnormal     Neuro/Psych negative psych ROS   GI/Hepatic negative GI ROS, Neg liver ROS,   Endo/Other  Hypothyroidism   Renal/GU negative Renal ROS     Musculoskeletal negative musculoskeletal ROS (+)   Abdominal (+) + obese,   Peds  Hematology   Anesthesia Other Findings   Reproductive/Obstetrics                           Anesthesia Physical Anesthesia Plan  ASA: III  Anesthesia Plan: General   Post-op Pain Management:    Induction: Intravenous  PONV Risk Score and Plan: 3 and Treatment may vary due to age or medical condition, Ondansetron, Dexamethasone and Scopolamine patch - Pre-op  Airway Management Planned: Oral ETT  Additional Equipment: None  Intra-op Plan:   Post-operative Plan: Extubation in OR  Informed Consent: I have reviewed the patients History and Physical, chart, labs and discussed the procedure including the risks, benefits and alternatives for the proposed anesthesia with the patient or authorized representative who has indicated his/her understanding and acceptance.     Dental advisory given  Plan Discussed with: CRNA and Anesthesiologist  Anesthesia Plan Comments:       Anesthesia Quick Evaluation

## 2020-07-13 NOTE — ED Notes (Signed)
Received report and care of patient. Remains on continuous cardiac and 02 sat monitoring. A/A/O, no distress.

## 2020-07-13 NOTE — ED Notes (Signed)
Consent for transfusion signed by pt at this time.

## 2020-07-13 NOTE — Consult Note (Addendum)
[facility] Consult: 8:19 AM 07/13/2020  LOS: 1 day    Referring Provider: Dr Eliseo Squires  Primary Care Physician:  Kelton Pillar, MD Primary Gastroenterologist:  Dr. Loletha Carrow     Reason for Consultation: Elevated LFTs.  Nominal pain.   HPI: [name] is a 75 y.o. female.  PMH HTN.  HLD.  A. fib on chronic Coumadin.  PTU therapy to thyroid.  Cholecystectomy.  C-section.  Abdominal hysterectomy with unilateral oophorectomy for what sounds like fibroids.   Abnormal LFTs, particularly alk phos noted in Feb 2022.  Trend of LFTs since early March are  T bili 1.2 >> 5.6 >> 15.2 yesterday >> 12.7 today. Alkaline phosphatase 363 >> 554 >> 455 AST/ALT 47/54 >> 147/88 >> 127/41 APAP level less than 10. Hepatitis A IgM nonreactive.  Hepatitis B surface Ag nonreactive.  Hepatitis B core IgM nonreactive.  HCV antibody nonreactive. Ceruloplasmin normal.  IgG normal.  A1 AT elevated 240.  ANA positive with cytoplasmic pattern, 1:80 titer.  Mitochondrial antibody 20 which is in normal range.  IgG 1460 WBCs 11.3 >> 12.3 over 2 weeks INR is 6.2 today.  Hb 11.9. 05/11/2020 abdominal ultrasound: Gallbladder surgically absent.  3.9 mm CBD.  Normal liver.  Normal portal vein.  Normal spleen.  Cysts in the left kidney.  No explanation for abnormal LFTs. 05/25/2020 MRI abdomen/pelvis: Mild intrahepatic biliary ductal prominence.  Upper normal diameter of extrahepatic biliary duct.  No obstructing mass, lesion or choledocholithiasis.  6.7 mm nodule at head of pancreas/ampullary region.  PD normal.  Tiny foci in both kidneys, too small to characterize but likely benign.  Patient has 08/10/2020 for EUS/ERCP w Dr. Ardis Hughs Abdominal pain progressing over the past few weeks.  Labs in last few days show rising LFTs.  She has developed jaundice,  anorexia.  Based on rising LFTs she was sent to the ER for admission. Abdominal pain is epigastric/RUQ.  It comes and goes.  Can last for 2 to 3 days and then she may go a week without pain.  Pain has become more frequent but not more intense.  Certain foods seem to trigger the pain, dairy and spicy foods in particular.  Pain accompanied by nonbloody nausea, vomiting.  For several days patient has noted jaundice.  Feels her abdominal girth has enlarged slightly and she is not noting weight loss. This past Monday, 4 days ago, her INR was 6.  Coumadin clinic advised her to hold Coumadin for 3 days so the last dose of Coumadin was on Sunday  Received FFP x 2 this morning Normotensive, heart rate in the 50s.  No fevers.  Room air sats well into the 90s.  Does not consume alcoholic beverages.  No history illicit/IV drug use. Family history pertinent for a brother who had cirrhosis.  Breast cancer in mother, sister.  Lung cancer in father.  Diabetes in brother. This with her husband in [place].  Past Medical History:  Diagnosis Date  . Borderline hyperlipidemia   . Borderline hypertension   . Hypertension   . Hypothyroid  s/p PTU therapy  . PAF (paroxysmal atrial fibrillation) ([place])     Past Surgical History:  Procedure Laterality Date  . CESAREAN SECTION    . CHOLECYSTECTOMY    . KNEE SURGERY     right 2014, left 2015, arthroscipic  . LEG SURGERY     Left leg d/t  tumor (benign)  . THYROID SURGERY      Prior to Admission medications   Medication Sig Start Date End Date Taking? Authorizing Provider  flecainide (TAMBOCOR) 100 MG tablet Take 1 tablet by mouth twice daily Patient taking differently: Take 100 mg by mouth 2 (two) times daily. 04/17/20  Yes Minus Breeding, MD  metoprolol tartrate (LOPRESSOR) 50 MG tablet Take 0.5 tablets (25 mg total) by mouth 2 (two) times daily. 06/09/19  Yes Cleaver, Jossie Ng, NP  omeprazole (PRILOSEC) 10 MG capsule Take 10 mg by mouth daily as needed  (acid).    Yes [provider]  warfarin (COUMADIN) 5 MG tablet TAKE 1/2 TO 1 (ONE-HALF TO ONE) TABLET BY MOUTH ONCE DAILY AS  DIRECTED  BY  COUMADIN  CLINIC Patient taking differently: Take 2.5-5 mg by mouth See admin instructions. Takes 2.5 mg  on Mondays , Wednesdays and Fridays and 5 mg on all other days 06/26/20  Yes Minus Breeding, MD    Scheduled Meds: . potassium chloride  40 mEq Oral Once   Infusions: . cefTRIAXone (ROCEPHIN)  IV Stopped (07/13/20 9373)  . lactated ringers 100 mL/hr at 07/13/20 0612   PRN Meds: ondansetron **OR** ondansetron (ZOFRAN) IV   Allergies as of 07/12/2020 - Review Complete 07/12/2020  Allergen Reaction Noted  . Chocolate Itching 06/09/2019  . Erythromycin  05/09/2020  . Other Itching 07/12/2020  . Shellfish allergy  07/12/2020  . Naproxen sodium Other (See Comments)   . Penicillins Rash   . Promethazine hcl Other (See Comments)     Family History  Problem Relation Age of Onset  . Lung cancer Father   . Breast cancer Mother        in 61's  . Cirrhosis Brother   . Diabetes Brother   . Breast cancer Sister 30    Social History   Socioeconomic History  . Marital status: Married    Spouse name: Not on file  . Number of children: Not on file  . Years of education: Not on file  . Highest education level: Not on file  Occupational History  . Not on file  Tobacco Use  . Smoking status: Former Smoker    Packs/day: 0.80    Years: 5.00    Pack years: 4.00    Types: Cigarettes    Quit date: 04/08/1982    Years since quitting: 38.2  . Smokeless tobacco: Never Used  Substance and Sexual Activity  . Alcohol use: No  . Drug use: No  . Sexual activity: Not on file  Other Topics Concern  . Not on file  Social History Narrative  . Not on file   Social Determinants of Health   Financial Resource Strain: Not on file  Food Insecurity: Not on file  Transportation Needs: Not on file  Physical Activity: Not on file  Stress: Not  on file  Social Connections: Not on file  Intimate Partner Violence: Not on file    REVIEW OF SYSTEMS: Constitutional: Feels a bit tired but no profound weakness. ENT:  No nose bleeds Pulm: No shortness of breath or cough. CV:  No palpitations, no LE edema.  No angina  GU:  No hematuria, no frequency GI: See HPI. Heme: Denies unusual or excessive bleeding or bruising. Transfusions: None but did get FFP early this morning Neuro:  No headaches, no peripheral tingling or numbness.  No seizures, no syncope. Derm:  No itching, no rash or sores.  Endocrine:  No sweats or chills.  No polyuria or dysuria Immunization: Not vaccinated for COVID-19.   PHYSICAL EXAM: Vital signs in last 24 hours: Vitals:   07/13/20 0630 07/13/20 0737  BP: (!) 169/59 (!) 156/58  Pulse: (!) 55 (!) 56  Resp: 20 14  Temp:  98 F (36.7 C)  SpO2: 96% 98%   Wt Readings from Last 3 Encounters:  07/12/20 99.3 kg  07/02/20 99.3 kg  06/15/20 99.3 kg    General: Patient is obese, looks somewhat chronically ill.  Comfortable, alert. Head: No facial asymmetry or swelling.  No signs of head trauma. Eyes: Sclera are icteric.  Conjunctiva pink. Ears: Not hard of hearing Nose: No congestion or discharge Mouth: Mucosa is moist, pink, clear.  Tongue midline. Neck: No mass, no thyromegaly, no JVD Lungs: No labored breathing or cough.  Lungs clear bilaterally Heart: RRR.  No MRG.  S1, S2 present Abdomen: Soft.  Tender in the right upper quadrant and epigastrium without guarding or rebound.  No bruits, hernias, masses.  Active bowel sounds..   Rectal: Deferred Musc/Skeltl: No joint redness, swelling or gross deformity. Extremities: No CCE. Neurologic: Oriented x3.  Moves all 4 limbs.  No tremor, no gross deficits or weakness. Skin: Mild, subtle jaundice. Tattoos: None observed Nodes: No cervical adenopathy Psych: Pleasant, calm, cooperative.  LAB RESULTS: Recent Labs    07/12/20 1815 07/13/20 0242  WBC 12.3*  13.3*  HGB 13.1 11.9*  HCT 38.2 34.6*  PLT 247 241   BMET Lab Results  Component Value Date   NA 134 (L) 07/13/2020   NA 134 (L) 07/12/2020   NA 136 07/02/2020   K 3.2 (L) 07/13/2020   K 3.3 (L) 07/12/2020   K 3.4 (L) 07/02/2020   CL 100 07/13/2020   CL 99 07/12/2020   CL 102 07/02/2020   CO2 26 07/13/2020   CO2 27 07/12/2020   CO2 25 07/02/2020   GLUCOSE 132 (H) 07/13/2020   GLUCOSE 115 (H) 07/12/2020   GLUCOSE 116 (H) 07/02/2020   BUN 18 07/13/2020   BUN 18 07/12/2020   BUN 13 07/02/2020   CREATININE 1.35 (H) 07/13/2020   CREATININE 1.42 (H) 07/12/2020   CREATININE 1.28 (H) 07/02/2020   CALCIUM 8.7 (L) 07/13/2020   CALCIUM 8.9 07/12/2020   CALCIUM 9.0 07/02/2020   LFT Recent Labs    07/12/20 1041 07/12/20 1815 07/13/20 0242  PROT 6.8 7.4 6.6  ALBUMIN 3.1* 2.7* 2.4*  AST 118* 142* 127*  ALT 71* 81* 73*  ALKPHOS 554* 537* 455*  BILITOT 13.7* 15.2* 12.7*  BILIDIR 8.4*  --   --    PT/INR Lab Results  Component Value Date   INR 6.2 (HH) 07/12/2020   INR 6.0 (A) 07/10/2020   INR 1.4 (A) 06/26/2020   Hepatitis Panel Recent Labs    07/13/20 0242  HEPBSAG NON REACTIVE  HCVAB NON REACTIVE  HEPAIGM NON REACTIVE  HEPBIGM NON REACTIVE   Lipase     Component Value Date/Time   LIPASE 25 07/12/2020 2004      IMPRESSION:   *   Abdominal pain, rising LFTs, particularly alkaline phosphatase.  Mild intrahepatic biliary ductal prominence and upper limits of normal common  bile duct, nodule at the ampulla/head of pancreas on MRI in February *   Chronic Coumadin.  Hx PAF Coagulopathy with INR 6.2.  Received FFP x 2 very early this morning and repeat INR ordered.  *   Hypokalemia.  *    CKD.  *    mild hyperglycemia.  No previous diagnosis of diabetes    PLAN:     *  EUS/ERCP. this is set up for 1 PM tomorrow.  *    CA 19-9 ordered  *   Await INR fup day and will recheck early tomorrow morning and give additional FFP if needed.  *    Ordered  regular diet.  *   Note she has orders for Rocephin daily, will discuss with MD whether or not this is necessary.  Definitely needs a dose of antibiotic prior to tomorrow's procedures.   [name]  07/13/2020, 8:19 AM Phone [phone]  ________________________________________________________________________  Velora Heckler GI MD note:  I personally examined the patient, reviewed the data and agree with the assessment and plan described above.  She's had mild abd pains, nausea, anorexia and 15-20 pound weight loss, now jaundice. Remote complicated lap chole with post op bile leak, treated with ERCP, stenting seems unlikely to be contributing.  CT suggests a 'nodule' in her pancreatic head, obviously this raises concern for neoplasm.  I am planning EUS and ERCP tomorrow after further INR reversal (was 6s today).  Jessica Loffler, MD [facility] Pager [phone]

## 2020-07-14 ENCOUNTER — Encounter (HOSPITAL_COMMUNITY)
Admission: EM | Disposition: A | Discharge status: Home / Self Care | Payer: Self-pay | Source: Home / Self Care | Attending: Internal Medicine

## 2020-07-14 ENCOUNTER — Inpatient Hospital Stay (HOSPITAL_COMMUNITY): Payer: Medicare PPO

## 2020-07-14 ENCOUNTER — Inpatient Hospital Stay (HOSPITAL_COMMUNITY): Payer: Medicare PPO | Admitting: Anesthesiology

## 2020-07-14 ENCOUNTER — Encounter (HOSPITAL_COMMUNITY): Payer: Self-pay | Admitting: Internal Medicine

## 2020-07-14 HISTORY — PX: SPHINCTEROTOMY: SHX5544

## 2020-07-14 HISTORY — PX: ESOPHAGOGASTRODUODENOSCOPY (EGD) WITH PROPOFOL: SHX5813

## 2020-07-14 HISTORY — PX: UPPER ESOPHAGEAL ENDOSCOPIC ULTRASOUND (EUS): SHX6562

## 2020-07-14 HISTORY — PX: BILIARY DILATION: SHX6850

## 2020-07-14 HISTORY — PX: REMOVAL OF STONES: SHX5545

## 2020-07-14 HISTORY — PX: ENDOSCOPIC RETROGRADE CHOLANGIOPANCREATOGRAPHY (ERCP) WITH PROPOFOL: SHX5810

## 2020-07-14 LAB — PREPARE FRESH FROZEN PLASMA

## 2020-07-14 LAB — COMPREHENSIVE METABOLIC PANEL
ALT: 58 U/L — ABNORMAL HIGH (ref 0–44)
AST: 105 U/L — ABNORMAL HIGH (ref 15–41)
Albumin: 2.2 g/dL — ABNORMAL LOW (ref 3.5–5.0)
Alkaline Phosphatase: 401 U/L — ABNORMAL HIGH (ref 38–126)
Anion gap: 8 (ref 5–15)
BUN: 16 mg/dL (ref 8–23)
CO2: 26 mmol/L (ref 22–32)
Calcium: 8.6 mg/dL — ABNORMAL LOW (ref 8.9–10.3)
Chloride: 103 mmol/L (ref 98–111)
Creatinine, Ser: 1.21 mg/dL — ABNORMAL HIGH (ref 0.44–1.00)
GFR, Estimated: 47 mL/min — ABNORMAL LOW (ref >60)
Glucose, Bld: 86 mg/dL (ref 70–99)
Potassium: 3.9 mmol/L (ref 3.5–5.1)
Sodium: 137 mmol/L (ref 135–145)
Total Bilirubin: 10.7 mg/dL — ABNORMAL HIGH (ref 0.3–1.2)
Total Protein: 6.2 g/dL — ABNORMAL LOW (ref 6.5–8.1)

## 2020-07-14 LAB — PROTIME-INR
INR: 2.4 — ABNORMAL HIGH (ref 0.8–1.2)
INR: 1.6 — ABNORMAL HIGH (ref 0.8–1.2)
Prothrombin Time: 25.4 seconds — ABNORMAL HIGH (ref 11.4–15.2)
Prothrombin Time: 18.8 seconds — ABNORMAL HIGH (ref 11.4–15.2)

## 2020-07-14 LAB — TSH: TSH: 2.186 u[IU]/mL (ref 0.350–4.500)

## 2020-07-14 LAB — BPAM FFP
Blood Product Expiration Date: 202204112359
Blood Product Expiration Date: 202204112359
ISSUE DATE / TIME: 202204070027
ISSUE DATE / TIME: 202204070332
Unit Type and Rh: 5100
Unit Type and Rh: 5100

## 2020-07-14 LAB — CBC
HCT: 32.2 % — ABNORMAL LOW (ref 36.0–46.0)
Hemoglobin: 11.1 g/dL — ABNORMAL LOW (ref 12.0–15.0)
MCH: 29.8 pg (ref 26.0–34.0)
MCHC: 34.5 g/dL (ref 30.0–36.0)
MCV: 86.6 fL (ref 80.0–100.0)
Platelets: 241 10*3/uL (ref 150–400)
RBC: 3.72 MIL/uL — ABNORMAL LOW (ref 3.87–5.11)
RDW: 17.1 % — ABNORMAL HIGH (ref 11.5–15.5)
WBC: 10.9 10*3/uL — ABNORMAL HIGH (ref 4.0–10.5)
nRBC: 0 % (ref 0.0–0.2)

## 2020-07-14 SURGERY — ENDOSCOPIC RETROGRADE CHOLANGIOPANCREATOGRAPHY (ERCP) WITH PROPOFOL
Anesthesia: General

## 2020-07-14 MED ORDER — INDOMETHACIN 50 MG RE SUPP
RECTAL | Status: DC | PRN
  Administered 2020-07-14: 100 mg via RECTAL

## 2020-07-14 MED ORDER — PROPOFOL 10 MG/ML IV BOLUS
INTRAVENOUS | Status: DC | PRN
  Administered 2020-07-14: 140 mg via INTRAVENOUS

## 2020-07-14 MED ORDER — HYDRALAZINE HCL 25 MG PO TABS: 25.0000 mg | ORAL_TABLET | Freq: Four times a day (QID) | ORAL | Status: DC | PRN

## 2020-07-14 MED ORDER — SCOPOLAMINE 1 MG/3DAYS TD PT72
1.0000 | MEDICATED_PATCH | TRANSDERMAL | Status: DC
  Administered 2020-07-14: 1.5 mg via TRANSDERMAL

## 2020-07-14 MED ORDER — ONDANSETRON HCL 4 MG/2ML IJ SOLN
INTRAMUSCULAR | Status: DC | PRN
  Administered 2020-07-14: 4 mg via INTRAVENOUS

## 2020-07-14 MED ORDER — DEXAMETHASONE SODIUM PHOSPHATE 10 MG/ML IJ SOLN
INTRAMUSCULAR | Status: DC | PRN
  Administered 2020-07-14: 10 mg via INTRAVENOUS

## 2020-07-14 MED ORDER — SODIUM CHLORIDE 0.9% IV SOLUTION: Freq: Once | INTRAVENOUS | Status: DC

## 2020-07-14 MED ORDER — FENTANYL CITRATE (PF) 250 MCG/5ML IJ SOLN
INTRAMUSCULAR | Status: DC | PRN
  Administered 2020-07-14: 50 ug via INTRAVENOUS

## 2020-07-14 MED ORDER — GLUCAGON HCL RDNA (DIAGNOSTIC) 1 MG IJ SOLR
INTRAMUSCULAR | Status: AC
  Filled 2020-07-14: qty 1

## 2020-07-14 MED ORDER — GLYCOPYRROLATE 0.2 MG/ML IJ SOLN
INTRAMUSCULAR | Status: DC | PRN
  Administered 2020-07-14: .1 mg via INTRAVENOUS

## 2020-07-14 MED ORDER — EPHEDRINE SULFATE-NACL 50-0.9 MG/10ML-% IV SOSY
PREFILLED_SYRINGE | INTRAVENOUS | Status: DC | PRN
  Administered 2020-07-14: 10 mg via INTRAVENOUS
  Administered 2020-07-14: 5 mg via INTRAVENOUS

## 2020-07-14 MED ORDER — ROCURONIUM BROMIDE 10 MG/ML (PF) SYRINGE
PREFILLED_SYRINGE | INTRAVENOUS | Status: DC | PRN
  Administered 2020-07-14: 50 mg via INTRAVENOUS

## 2020-07-14 MED ORDER — CIPROFLOXACIN IN D5W 400 MG/200ML IV SOLN
INTRAVENOUS | Status: AC
  Filled 2020-07-14: qty 200

## 2020-07-14 MED ORDER — FENTANYL CITRATE (PF) 100 MCG/2ML IJ SOLN
INTRAMUSCULAR | Status: AC
  Filled 2020-07-14: qty 2

## 2020-07-14 MED ORDER — SODIUM CHLORIDE 0.9 % IV SOLN: INTRAVENOUS | Status: DC

## 2020-07-14 MED ORDER — SODIUM CHLORIDE 0.9 % IV SOLN
INTRAVENOUS | Status: DC | PRN
  Administered 2020-07-14: 60 mL

## 2020-07-14 MED ORDER — SUGAMMADEX SODIUM 200 MG/2ML IV SOLN
INTRAVENOUS | Status: DC | PRN
  Administered 2020-07-14: 200 mg via INTRAVENOUS

## 2020-07-14 MED ORDER — INDOMETHACIN 50 MG RE SUPP
RECTAL | Status: AC
  Filled 2020-07-14: qty 2

## 2020-07-14 MED ORDER — LIDOCAINE 2% (20 MG/ML) 5 ML SYRINGE
INTRAMUSCULAR | Status: DC | PRN
  Administered 2020-07-14: 60 mg via INTRAVENOUS

## 2020-07-14 MED ORDER — SCOPOLAMINE 1 MG/3DAYS TD PT72
MEDICATED_PATCH | TRANSDERMAL | Status: AC
  Filled 2020-07-14: qty 1

## 2020-07-14 MED ORDER — METOPROLOL TARTRATE 12.5 MG HALF TABLET: 12.5000 mg | ORAL_TABLET | Freq: Two times a day (BID) | ORAL | Status: DC

## 2020-07-14 MED ORDER — PHENYLEPHRINE HCL-NACL 10-0.9 MG/250ML-% IV SOLN
INTRAVENOUS | Status: DC | PRN
  Administered 2020-07-14: 20 ug/min via INTRAVENOUS

## 2020-07-14 MED ORDER — LACTATED RINGERS IV SOLN: INTRAVENOUS | Status: DC

## 2020-07-14 NOTE — Anesthesia Postprocedure Evaluation (Signed)
Anesthesia Post Note  Patient: [name]  Procedure(s) Performed: ENDOSCOPIC RETROGRADE CHOLANGIOPANCREATOGRAPHY (ERCP) WITH PROPOFOL (N/A ) UPPER ESOPHAGEAL ENDOSCOPIC ULTRASOUND (EUS) (N/A ) SPHINCTEROTOMY REMOVAL OF STONES BILIARY DILATION     Patient location during evaluation: Endoscopy Anesthesia Type: General Level of consciousness: awake and alert Pain management: pain level controlled Vital Signs Assessment: post-procedure vital signs reviewed and stable Respiratory status: spontaneous breathing, nonlabored ventilation, respiratory function stable and patient connected to nasal cannula oxygen Cardiovascular status: blood pressure returned to baseline and stable Postop Assessment: no apparent nausea or vomiting Anesthetic complications: no   No complications documented.  Last Vitals:  Vitals:   07/14/20 1500 07/14/20 1510  BP: (!) 122/25 (!) 129/24  Pulse: 63 (!) 59  Resp: 16 19  Temp:    SpO2: 96% 96%    Last Pain:  Vitals:   07/14/20 1510  TempSrc:   PainSc: 0-No pain                 Barnet Glasgow

## 2020-07-14 NOTE — Anesthesia Procedure Notes (Signed)
Procedure Name: Intubation Date/Time: 07/14/2020 1:19 PM Performed by: Thelma Comp, CRNA Pre-anesthesia Checklist: Patient identified, Emergency Drugs available, Suction available and Patient being monitored Patient Re-evaluated:Patient Re-evaluated prior to induction Oxygen Delivery Method: Circle System Utilized Preoxygenation: Pre-oxygenation with 100% oxygen Induction Type: IV induction Ventilation: Mask ventilation without difficulty and Oral airway inserted - appropriate to patient size Laryngoscope Size: Mac and 3 Grade View: Grade I Tube type: Oral Number of attempts: 1 Airway Equipment and Method: Stylet and Oral airway Placement Confirmation: ETT inserted through vocal cords under direct vision,  positive ETCO2 and breath sounds checked- equal and bilateral Secured at: 21 cm Tube secured with: Tape Dental Injury: Teeth and Oropharynx as per pre-operative assessment

## 2020-07-14 NOTE — Progress Notes (Signed)
PROGRESS NOTE    [name]  JAS:[id number] DOB: 1945/07/15 DOA: 07/12/2020 PCP: Kelton Pillar, MD   Brief Narrative: 75 year old with past medical history significant for paroxysmal A. fib, hypertension, hypothyroidism, hyperlipidemia status post cholecystectomy.  Patient has had 3 weeks of intermittent upper abdominal pain and worsening jaundice.  She was seen and evaluated 3 weeks ago by Dr. Ardis Hughs.  Her bilirubin was 5 , she also had transaminases at that time.  Ultrasound was done followed by MRI.  There was report of possible intrahepatic dilation with mild dilation of the extrahepatic duct as well.  No mass or stone noted.  Recommendation was for ED [place].  She had a small nodule about 6 to 7 mm at the head of the pancreas near the ampulla.  No pancreatic ductal dilation.  EUS as well as ERCP was scheduled for May 5 but patient presents with worsening abdominal pain to the ED.    Assessment & Plan:   Principal Problem:   Abdominal pain Active Problems:   Hypothyroidism   Obesity, unspecified   Essential hypertension   ATRIAL FIBRILLATION, PAROXYSMAL   Abdominal pain, epigastric   Hypokalemia   Hyponatremia   Coagulopathy (HCC)   1-Abdominal pain with transaminases hyperbilirubinemia: -Possible obstructive pathology.  -GI consulted -Plan for EUS/ERCP today  -CA 19-9 ordered -on IV ceftriaxone.    2-Coagulopathy On warfarin for A. Fib Supra- therapeutic INR Received if FFP INR down to 2--1.6 Resume coumadin when ok by GI, Could consider change it to epiquis. Needs to check if insurance will cover.   Thyroid:  Patient relates a history of hyperfunction thyroid, not hypothyroidism. She is not on synthroid. Will check THS>   Hyponatremia: Resolved with IV fluids  Hypertension: On metoprolol.   Paroxysmal A. Fib// Bradycardia.  Continue with flecainide Decrease metoprolol to 12. 5 mg BID, Holder parameter for HR.  Holding Coumadin, for planned  procedure  Obesity; needs life style modifications.   Hypokalemia: Repleted  Estimated body mass index is 44.23 kg/m as calculated from the following:   Height as of this encounter: 4\' 11"  (1.499 m).   Weight as of this encounter: 99.3 kg.   DVT prophylaxis: on coumadin  Code Status: Full code Family Communication: husband who was at bedside Disposition Plan:  Status is: Inpatient  Remains inpatient appropriate because:IV treatments appropriate due to intensity of illness or inability to take PO   Dispo: The patient is from: Home              Anticipated d/c is to: Home              Patient currently is not medically stable to d/c.   Difficult to place patient No        Consultants:   GI  Procedures:   EUS  Antimicrobials:    Subjective: She was alert, denies chest pain or dyspnea. No abdominal pain  Objective: Vitals:   07/14/20 0320 07/14/20 0626 07/14/20 0644 07/14/20 0732  BP: (!) 153/53 (!) 156/59 (!) 151/57 (!) 171/69  Pulse: 60 (!) 51 (!) 54 (!) 52  Resp: 17 20 14 16   Temp: 98.5 F (36.9 C) 98.4 F (36.9 C) 98.4 F (36.9 C) 98 F (36.7 C)  TempSrc: Oral Oral Oral Oral  SpO2: 97% 99% 97% 98%  Weight:      Height:        Intake/Output Summary (Last 24 hours) at 07/14/2020 0756 Last data filed at 07/14/2020 0300 Gross per 24 hour  Intake 110.28 ml  Output --  Net 110.28 ml   Filed Weights   07/12/20 1811  Weight: 99.3 kg    Examination:  General exam: Appears calm and comfortable  Respiratory system: Clear to auscultation. Respiratory effort normal. Cardiovascular system: S1 & S2 heard, RRR. No JVD, murmurs, rubs, gallops or clicks. No pedal edema. Gastrointestinal system: Abdomen is nondistended, soft and nontender. No organomegaly or masses felt. Normal bowel sounds heard. Central nervous system: Alert and oriented. No focal neurological deficits. Extremities: Symmetric 5 x 5 power.    Data Reviewed: I have personally reviewed  following labs and imaging studies  CBC: Recent Labs  Lab 07/12/20 1815 07/13/20 0242 07/14/20 0201  WBC 12.3* 13.3* 10.9*  HGB 13.1 11.9* 11.1*  HCT 38.2 34.6* 32.2*  MCV 89.5 88.5 86.6  PLT 247 241 716   Basic Metabolic Panel: Recent Labs  Lab 07/12/20 1815 07/13/20 0242 07/13/20 1621 07/14/20 0201  NA 134* 134*  --  137  K 3.3* 3.2*  --  3.9  CL 99 100  --  103  CO2 27 26  --  26  GLUCOSE 115* 132*  --  86  BUN 18 18  --  16  CREATININE 1.42* 1.35*  --  1.21*  CALCIUM 8.9 8.7*  --  8.6*  MG  --   --  1.9  --    GFR: Estimated Creatinine Clearance: 42.9 mL/min (A) (by C-G formula based on SCr of 1.21 mg/dL (H)). Liver Function Tests: Recent Labs  Lab 07/12/20 1041 07/12/20 1815 07/13/20 0242 07/14/20 0201  AST 118* 142* 127* 105*  ALT 71* 81* 73* 58*  ALKPHOS 554* 537* 455* 401*  BILITOT 13.7* 15.2* 12.7* 10.7*  PROT 6.8 7.4 6.6 6.2*  ALBUMIN 3.1* 2.7* 2.4* 2.2*   Recent Labs  Lab 07/12/20 1815 07/12/20 2004  LIPASE 25 25   No results for input(s): AMMONIA in the last 168 hours. Coagulation Profile: Recent Labs  Lab 07/10/20 0825 07/12/20 2004 07/13/20 1621 07/14/20 0201  INR 6.0* 6.2* 2.5* 2.4*   Cardiac Enzymes: No results for input(s): CKTOTAL, CKMB, CKMBINDEX, TROPONINI in the last 168 hours. BNP (last 3 results) No results for input(s): PROBNP in the last 8760 hours. HbA1C: No results for input(s): HGBA1C in the last 72 hours. CBG: No results for input(s): GLUCAP in the last 168 hours. Lipid Profile: No results for input(s): CHOL, HDL, LDLCALC, TRIG, CHOLHDL, LDLDIRECT in the last 72 hours. Thyroid Function Tests: No results for input(s): TSH, T4TOTAL, FREET4, T3FREE, THYROIDAB in the last 72 hours. Anemia Panel: No results for input(s): VITAMINB12, FOLATE, FERRITIN, TIBC, IRON, RETICCTPCT in the last 72 hours. Sepsis Labs: No results for input(s): PROCALCITON, LATICACIDVEN in the last 168 hours.  Recent Results (from the past 240  hour(s))  Resp Panel by RT-PCR (Flu A&B, Covid) Nasopharyngeal Swab     Status: None   Collection Time: 07/12/20  8:09 PM   Specimen: Nasopharyngeal Swab; Nasopharyngeal(NP) swabs in vial transport medium  Result Value Ref Range Status   SARS Coronavirus 2 by RT PCR NEGATIVE NEGATIVE Final    Comment: (NOTE) SARS-CoV-2 target nucleic acids are NOT DETECTED.  The SARS-CoV-2 RNA is generally detectable in upper respiratory specimens during the acute phase of infection. The lowest concentration of SARS-CoV-2 viral copies this assay can detect is 138 copies/mL. A negative result does not preclude SARS-Cov-2 infection and should not be used as the sole basis for treatment or other patient management decisions. A negative  result may occur with  improper specimen collection/handling, submission of specimen other than nasopharyngeal swab, presence of viral mutation(s) within the areas targeted by this assay, and inadequate number of viral copies(<138 copies/mL). A negative result must be combined with clinical observations, patient history, and epidemiological information. The expected result is Negative.  Fact Sheet for Patients:  EntrepreneurPulse.com.au  Fact Sheet for Healthcare Providers:  IncredibleEmployment.be  This test is no t yet approved or cleared by the [place] FDA and  has been authorized for detection and/or diagnosis of SARS-CoV-2 by FDA under an Emergency Use Authorization (EUA). This EUA will remain  in effect (meaning this test can be used) for the duration of the COVID-19 declaration under Section 564(b)(1) of the Act, 21 U.S.C.section 360bbb-3(b)(1), unless the authorization is terminated  or revoked sooner.       Influenza A by PCR NEGATIVE NEGATIVE Final   Influenza B by PCR NEGATIVE NEGATIVE Final    Comment: (NOTE) The Xpert Xpress SARS-CoV-2/FLU/RSV plus assay is intended as an aid in the diagnosis of influenza from  Nasopharyngeal swab specimens and should not be used as a sole basis for treatment. Nasal washings and aspirates are unacceptable for Xpert Xpress SARS-CoV-2/FLU/RSV testing.  Fact Sheet for Patients: EntrepreneurPulse.com.au  Fact Sheet for Healthcare Providers: IncredibleEmployment.be  This test is not yet approved or cleared by the [place] FDA and has been authorized for detection and/or diagnosis of SARS-CoV-2 by FDA under an Emergency Use Authorization (EUA). This EUA will remain in effect (meaning this test can be used) for the duration of the COVID-19 declaration under Section 564(b)(1) of the Act, 21 U.S.C. section 360bbb-3(b)(1), unless the authorization is terminated or revoked.  Performed at [facility], [place] [address], [place], [place] [address]          Radiology Studies: No results found.      Scheduled Meds: . sodium chloride   Intravenous Once  . flecainide  100 mg Oral BID  . indomethacin  100 mg Rectal Once  . metoprolol tartrate  25 mg Oral BID   Continuous Infusions: . sodium chloride 250 mL (07/13/20 2233)  . cefTRIAXone (ROCEPHIN)  IV 2 g (07/13/20 2235)  . lactated ringers 100 mL/hr at 07/13/20 0612     LOS: 2 days    Time spent: 35 minutes.     Elmarie Shiley, MD [facility]   If 7PM-7AM, please contact night-coverage [email or web address]  07/14/2020, 7:56 AM

## 2020-07-14 NOTE — TOC Benefit Eligibility Note (Signed)
Transition of Care [facility]) Benefit Eligibility Note    Patient Details  Name: [name] MRN: [id number] Date of Birth: 1945/09/26   Medication/Dose: Arne Cleveland  5 MG BID  Covered?: Yes  Tier: 2 Drug  Prescription Coverage Preferred Pharmacy: [facility]  , [facility]  Spoke with Person/Company/Phone Number:: TRUDY  @ [employer] # [phone]  Co-Pay: $ 40.00  Prior Approval: No  Deductible:  (NO DEDUCTIBLE WITH PLAN)       Memory Argue Phone Number: 07/14/2020, 12:12 PM

## 2020-07-14 NOTE — Op Note (Addendum)
[facility] Patient Name: [name] Procedure Date : 07/14/2020 MRN: [id number] Attending MD: Milus Banister , MD Date of Birth: January 16, 1946 CSN: [id number] Age: 75 Admit Type: Inpatient Procedure:                ERCP Indications:              Remoted complicated cholecystectomy c/b biliary                            leak requiring ERCPs 1999 with sphincterotomy and                            temporary stenting Providers:                Milus Banister, MD, Kary Kos RN, RN, Dulcy Fanny, Lesia Sago, Technician Referring MD:              Medicines:                General Anesthesia, Indomethacin 100 mg PR,                            rocephin IV in hospital Complications:            No immediate complications. Estimated blood loss:                            None Estimated Blood Loss:     Estimated blood loss: none. Procedure:                Pre-Anesthesia Assessment:                           - Prior to the procedure, a History and Physical                            was performed, and patient medications and                            allergies were reviewed. The patient's tolerance of                            previous anesthesia was also reviewed. The risks                            and benefits of the procedure and the sedation                            options and risks were discussed with the patient.                            All questions were answered, and informed consent                            was obtained. Prior  Anticoagulants: The patient has                            taken Coumadin (warfarin), last dose was 2 days                            prior to procedure. ASA Grade Assessment: III - A                            patient with severe systemic disease. After                            reviewing the risks and benefits, the patient was                            deemed in satisfactory condition to  undergo the                            procedure.                           After obtaining informed consent, the scope was                            passed under direct vision. Throughout the                            procedure, the patient's blood pressure, pulse, and                            oxygen saturations were monitored continuously. The                            TJF- Q180V ([id number]) Olympus duodenoscope was                            introduced through the mouth, and used to inject                            contrast into and used to cannulate the bile duct.                            The ERCP was accomplished without difficulty. The                            patient tolerated the procedure well. Scope In: Scope Out: Findings:      Scout film showed clips in right upper quadrant the duodenoscope was       advanced to the region of the major papilla without detailed examination       of the upper GI tract. There was a medium sized duodenal diverticulum       containing food particles about 2 cm distal to the major papilla. The       major papilla was small and showed evidence of remote biliary  sphincterotomy in 1999. I used a 44 autotome over a 0.035 Hydra wire to       cannulate the bile duct and injected contrast. Cholangiogram revealed a       nondilated biliary tree that contained some amorphous mobile filling       defects. Contrast filled the cystic duct remnant and also appeared to       locally extravasate and slightly pool in a very confined area adjacent       to the bile duct, about 1 or 2 cm proximal to the major papilla. There       were no obvious strictures. The distal intrahepatic ducts filled       normally. I extended the previous sphincterotomy somewhat and then I       used a 9 to 11 mm biliary retrieval balloon to sweep the duct several       times delivering moderate amount of amorphous green, brown stone debris.       There was no purulence. I  felt that there was a good chance that the       mechanism of her acute biliary, stone disease might be due to scarring       at her very remote sphincterotomy and so I elected to dilate the major       papilla a bit with a 6 mm Hurricaine balloon held inflated for 1 minute.       I think the local extravasation and pooling of contrast described above       is possibly related to her remote complicated cholecystectomy followed       by 2 ERCPs in 1999. This also very well may account for the "6 to 7 mm"       nodule noted in the pancreas or ampullary region on recent MRI. The main       pancreatic duct was cannulated with a wire a single time but never       injected with contrast. 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                           - Local extravation and pooling of contrast,                            described above. Recommendation:           - Return to hospital for ongoing care. Will advance                            diet.                           -  OK to change to oral antibitoics and advance diet.                           - Likely safe for d/c tomorrow, would complete 5                            more days of antibiotics and restart coumadin at                            discharge.                           - [facility] will arrange repeat labs (lfts) in 2                            weeks. Procedure Code(s):        --- Professional ---                           [phone], Endoscopic retrograde                            cholangiopancreatography (ERCP); with                            sphincterotomy/papillotomy Diagnosis Code(s):        --- Professional ---                           K80.50, Calculus of bile duct without cholangitis                             or cholecystitis without obstruction CPT copyright 2019 American [employer]. All rights reserved. The codes documented in this report are preliminary and upon coder review may  be revised to meet current compliance requirements. Milus Banister, MD 07/14/2020 2:50:14 PM This report has been signed electronically. Number of Addenda: 0

## 2020-07-14 NOTE — Op Note (Signed)
[facility] Patient Name: [name] Procedure Date : 07/14/2020 MRN: [id number] Attending MD: Milus Banister , MD Date of Birth: 02/02/1946 CSN: [id number] Age: 75 Admit Type: Inpatient Procedure:                Upper EUS Indications:              Very remote cholecystectomy c/b biliary leak that                            was treated wtih 1999 ERCP sphicterotomy/stent                            placement, repeat ERCP 2 months later for stent                            removal. Has had intermittent abd pains,                            transiently elevated liver tests. Currently with                            LFTs much higher. MRI 2 months ago reads "Mild                            intrahepatic biliary duct prominence with upper                            normal diameter of extrahepatic biliary ductal                            anatomy. No obstructing mass lesion or evidence for                            choledocholithiasis. 6-7 mm enhancing nodule                            identified in the head of pancreas in the region of                            the ampulla. This may be related to the duodenum or                            ampulla/distal common bile duct." Providers:                Milus Banister, MD, Kary Kos RN, RN, Dulcy Fanny, Lesia Sago, Technician Referring MD:              Medicines:                General Anesthesia Complications:            No immediate complications. Estimated blood loss:  None. Estimated Blood Loss:     Estimated blood loss: none. Procedure:                Pre-Anesthesia Assessment:                           - Prior to the procedure, a History and Physical                            was performed, and patient medications and                            allergies were reviewed. The patient's tolerance of                            previous anesthesia was also  reviewed. The risks                            and benefits of the procedure and the sedation                            options and risks were discussed with the patient.                            All questions were answered, and informed consent                            was obtained. Prior Anticoagulants: The patient has                            taken Coumadin (warfarin), last dose was 2 days                            prior to procedure. ASA Grade Assessment: III - A                            patient with severe systemic disease. After                            reviewing the risks and benefits, the patient was                            deemed in satisfactory condition to undergo the                            procedure.                           After obtaining informed consent, the endoscope was                            passed under direct vision. Throughout the                            procedure,  the patient's blood pressure, pulse, and                            oxygen saturations were monitored continuously. The                            GF-UE160-AL5 ([id number]) Olympus Radial EUS scope was                            introduced through the mouth, and advanced to the                            second part of duodenum. The upper EUS was                            accomplished without difficulty. The patient                            tolerated the procedure well. Scope In: Scope Out: Findings:      ENDOSCOPIC FINDING (limited views with radial echoendoscope): :      The examined esophagus was endoscopically normal.      The entire examined stomach was endoscopically normal.      Distal duodenum divertculum noted.      ENDOSONOGRAPHIC FINDING: :      1. The CBD was non-dilated (26mm) and contained mobile slude, stone       debris.      2. I did not appreciate the pancreatic/ampullary nodule that was       described on MR 2 months ago.      3. Pancreatic parenchyma was  normal throughout, limited somewhat by       distal duodenum diverticulum containing food (best seen during ERCP       examination which followed this).      4. Main pancreatic duct was normal appearing.      5. Reactive appearing periportal lymphnode (subCM, irregularly shaped) Impression:               - CBD stones, debris. No evidence of pancreatic                            masses or tumors. Recommendation:           - ERCP now. Procedure Code(s):        --- Professional ---                           [id number], Esophagogastroduodenoscopy, flexible,                            transoral; with endoscopic ultrasound examination                            limited to the esophagus, stomach or duodenum, and                            adjacent structures Diagnosis Code(s):        --- Professional ---  K80.50, Calculus of bile duct without cholangitis                            or cholecystitis without obstruction                           K83.8, Other specified diseases of biliary tract CPT copyright 2019 American Medical Association. All rights reserved. The codes documented in this report are preliminary and upon coder review may  be revised to meet current compliance requirements. Milus Banister, MD 07/14/2020 2:34:30 PM This report has been signed electronically. Number of Addenda: 0

## 2020-07-14 NOTE — Progress Notes (Signed)
Initial Nutrition Assessment  DOCUMENTATION CODES:  Not applicable  INTERVENTION:  Advance diet as able s/p ERCP and EUS  Monitor intake trends and add nutrition supplement to augment intake if not meeting estimated needs through meal trays alone.  NUTRITION DIAGNOSIS:  Inadequate oral intake related to decreased appetite as evidenced by per patient/family report.  GOAL:  Patient will meet greater than or equal to 90% of their needs  MONITOR:  PO intake,Supplement acceptance,Diet advancement  REASON FOR ASSESSMENT:  Malnutrition Screening Tool    ASSESSMENT:  Pt admitted from PCP office for abnormal labs. LFTs worsening outpatient. Pt reports ongoing abdominal pain and jaundice x 3 weeks. Pt reports low appetite due to nausea. PMH relevant for atrial fibrillation, HTN, HLD.  Attempted to assess pt at bedside, currently out of room for ERCP/EUS. Pt reported poor intake PTA due to abdominal pain and nausea. No significant weight loss is seen in the past year. Will monitor for diet advancement and recommend supplements if pt is not meeting needs through diet alone.  Medications reviewed Labs reviewed: 1.21 creatinine  NUTRITION - FOCUSED PHYSICAL EXAM: Unable to assess - patient out of room for procedure  Diet Order:   Diet Order            Diet NPO time specified  Diet effective now                 EDUCATION NEEDS:  No education needs have been identified at this time  Skin:  Skin Assessment: Reviewed RN Assessment  Last BM:  4/7 per RN documentation  Height:  Ht Readings from Last 1 Encounters:  07/14/20 4\' 11"  (1.499 m)    Weight:  Wt Readings from Last 4 Encounters:  07/14/20 99.3 kg  07/02/20 99.3 kg  06/15/20 99.3 kg  07/26/19 103.9 kg   Ideal Body Weight:  44.3 kg  BMI:  Body mass index is 44.23 kg/m.  Estimated Nutritional Needs:   Kcal:  1500-1700  Protein:  75-85  Fluid:  >1574mL/d   Ranell Patrick, RD, LDN Clinical Dietitian Pager  on Amion

## 2020-07-14 NOTE — Interval H&P Note (Signed)
History and Physical Interval Note:  07/14/2020 12:37 PM  [name]  has presented today for surgery, with the diagnosis of Elevated LFTs.  Abdominal pain.  Lesion at head of pancreas/ampulla on MR..  The various methods of treatment have been discussed with the patient and family. After consideration of risks, benefits and other options for treatment, the patient has consented to  Procedure(s): ENDOSCOPIC RETROGRADE CHOLANGIOPANCREATOGRAPHY (ERCP) WITH PROPOFOL (N/A) UPPER ESOPHAGEAL ENDOSCOPIC ULTRASOUND (EUS) (N/A) as a surgical intervention.  The patient's history has been reviewed, patient examined, no change in status, stable for surgery.  I have reviewed the patient's chart and labs.  Questions were answered to the patient's satisfaction.     Milus Banister

## 2020-07-14 NOTE — Transfer of Care (Signed)
Immediate Anesthesia Transfer of Care Note  Patient: FAATIMA TENCH  Procedure(s) Performed: ENDOSCOPIC RETROGRADE CHOLANGIOPANCREATOGRAPHY (ERCP) WITH PROPOFOL (N/A ) UPPER ESOPHAGEAL ENDOSCOPIC ULTRASOUND (EUS) (N/A ) SPHINCTEROTOMY REMOVAL OF STONES BILIARY DILATION  Patient Location: ENDO  Anesthesia Type:General  Level of Consciousness: awake, alert  and patient cooperative  Airway & Oxygen Therapy: Patient Spontanous Breathing  Post-op Assessment: Report given to RN and Post -op Vital signs reviewed and stable  Post vital signs: Reviewed and stable  Last Vitals:  Vitals Value Taken Time  BP 133/25 07/14/20 1445  Temp 36.7 C 07/14/20 1440  Pulse 72 07/14/20 1446  Resp 17 07/14/20 1446  SpO2 93 % 07/14/20 1446  Vitals shown include unvalidated device data.  Last Pain:  Vitals:   07/14/20 1440  TempSrc:   PainSc: Asleep     MDA Houser aware of pt DBP    Complications: No complications documented.

## 2020-07-15 DIAGNOSIS — K8050 Calculus of bile duct without cholangitis or cholecystitis without obstruction: Secondary | ICD-10-CM

## 2020-07-15 DIAGNOSIS — R7989 Other specified abnormal findings of blood chemistry: Secondary | ICD-10-CM

## 2020-07-15 DIAGNOSIS — R948 Abnormal results of function studies of other organs and systems: Secondary | ICD-10-CM

## 2020-07-15 DIAGNOSIS — K831 Obstruction of bile duct: Secondary | ICD-10-CM

## 2020-07-15 LAB — BPAM FFP
Blood Product Expiration Date: 202204122359
Blood Product Expiration Date: 202204122359
ISSUE DATE / TIME: 202204080615
ISSUE DATE / TIME: 202204080807
Unit Type and Rh: 5100
Unit Type and Rh: 9500

## 2020-07-15 LAB — PREPARE FRESH FROZEN PLASMA
Unit division: 0
Unit division: 0

## 2020-07-15 LAB — CBC
HCT: 30.5 % — ABNORMAL LOW (ref 36.0–46.0)
Hemoglobin: 10.5 g/dL — ABNORMAL LOW (ref 12.0–15.0)
MCH: 30.5 pg (ref 26.0–34.0)
MCHC: 34.4 g/dL (ref 30.0–36.0)
MCV: 88.7 fL (ref 80.0–100.0)
Platelets: 246 10*3/uL (ref 150–400)
RBC: 3.44 MIL/uL — ABNORMAL LOW (ref 3.87–5.11)
RDW: 17.2 % — ABNORMAL HIGH (ref 11.5–15.5)
WBC: 10.5 10*3/uL (ref 4.0–10.5)
nRBC: 0 % (ref 0.0–0.2)

## 2020-07-15 LAB — CANCER ANTIGEN 19-9: CA 19-9: 118 U/mL — ABNORMAL HIGH (ref 0–35)

## 2020-07-15 LAB — COMPREHENSIVE METABOLIC PANEL
ALT: 59 U/L — ABNORMAL HIGH (ref 0–44)
AST: 99 U/L — ABNORMAL HIGH (ref 15–41)
Albumin: 2.4 g/dL — ABNORMAL LOW (ref 3.5–5.0)
Alkaline Phosphatase: 381 U/L — ABNORMAL HIGH (ref 38–126)
Anion gap: 8 (ref 5–15)
BUN: 18 mg/dL (ref 8–23)
CO2: 27 mmol/L (ref 22–32)
Calcium: 8.7 mg/dL — ABNORMAL LOW (ref 8.9–10.3)
Chloride: 99 mmol/L (ref 98–111)
Creatinine, Ser: 1.36 mg/dL — ABNORMAL HIGH (ref 0.44–1.00)
GFR, Estimated: 41 mL/min — ABNORMAL LOW (ref >60)
Glucose, Bld: 106 mg/dL — ABNORMAL HIGH (ref 70–99)
Potassium: 3.8 mmol/L (ref 3.5–5.1)
Sodium: 134 mmol/L — ABNORMAL LOW (ref 135–145)
Total Bilirubin: 11.3 mg/dL — ABNORMAL HIGH (ref 0.3–1.2)
Total Protein: 6.3 g/dL — ABNORMAL LOW (ref 6.5–8.1)

## 2020-07-15 MED ORDER — CEFDINIR 300 MG PO CAPS: 300.0000 mg | ORAL_CAPSULE | Freq: Two times a day (BID) | ORAL | 0 refills | Status: AC

## 2020-07-15 MED ORDER — ONDANSETRON HCL 4 MG PO TABS: 4.0000 mg | ORAL_TABLET | Freq: Four times a day (QID) | ORAL | 0 refills | Status: DC | PRN

## 2020-07-15 NOTE — Discharge Summary (Signed)
Physician Discharge Summary  [name] OXB:[id number] DOB: March 04, 1946 DOA: 07/12/2020  PCP: Kelton Pillar, MD  Admit date: 07/12/2020 Discharge date: 07/15/2020  Admitted From: Home  disposition: Home  Recommendations for Outpatient Follow-up:  1. Follow up with PCP in 1-2 weeks 2. Follow-up with gastroenterology in 2 weeks 3. Please obtain CMP/CBC in one week 4. Please follow up on the following pending results: None  Home Health: No Equipment/Devices: None Discharge Condition: Stable CODE STATUS: Full Diet recommendation: Heart Healthy   Brief/Interim Summary: 75 year old with past medical history significant for paroxysmal A. fib, hypertension, hypothyroidism, hyperlipidemia status post cholecystectomy.  Patient has had 3 weeks of intermittent upper abdominal pain and worsening jaundice.  She was seen and evaluated 3 weeks ago by Dr. Ardis Hughs.  Her bilirubin was 5 , she also had transaminases at that time.  Ultrasound was done followed by MRI.  There was report of possible intrahepatic dilation with mild dilation of the extrahepatic duct as well.  No mass or stone noted. She had a small nodule about 6 to 7 mm at the head of the pancreas near the ampulla.  No pancreatic ductal dilation.  EUS as well as ERCP was scheduled for May 5 but patient presents with worsening abdominal pain to the ED.  Patient had her EUS on 07/14/2020, they did not noticed any pancreatic abnormality but her CA 19-9 is elevated.  She will get benefit for further investigation regarding a possible malignancy.  She was found to have an obstructing stone which was removed and sphincterotomy was done during procedure.  Her alkaline phosphatase and T bilirubin started trending down, she will follow-up with gastroenterology in 2 weeks for repeat labs and further recommendations.  Patient was able to tolerate diet well and denies any more abdominal pain before discharge.  Patient has an history of hyperfunctioning thyroid.   TSH was within normal limit.  Patient has an history of paroxysmal A. fib and bradycardia.  She will continue her home dose of flecainide and metoprolol.  We held her Coumadin for procedure and she can resume on discharge.  She will follow-up with her cardiologist and primary care provider for further recommendations.  Patient has stage III obesity with BMI of 44.23 kg/m , this will complicate overall prognosis and she will need extensive counseling regarding weight loss.  She will continue rest of her home medications and follow-up with her providers..   Discharge Diagnoses:  Principal Problem:   Abdominal pain Active Problems:   Hypothyroidism   Obesity, unspecified   Essential hypertension   ATRIAL FIBRILLATION, PAROXYSMAL   Abdominal pain, epigastric   Hypokalemia   Hyponatremia   Coagulopathy (HCC)   Abnormal pancreas function test   Choledocholithiasis   Discharge Instructions  Discharge Instructions    Diet - low sodium heart healthy   Complete by: As directed    Discharge instructions   Complete by: As directed    It was pleasure taking care of you. You are being given antibiotics for 5 more days, please take it as directed. Please keep yourself well-hydrated and eat softer diet for the next few days and gradually resume your normal diet. You can resume your Coumadin from tomorrow and follow-up at Coumadin clinic for further titration of your dose so you can stay at therapeutic level. Follow-up with gastroenterology in 2 weeks. Follow-up with your primary care provider within a week.   Increase activity slowly   Complete by: As directed      Allergies as  of 07/15/2020      Reactions   Chocolate Itching   Itching mouth   Erythromycin    Other reaction(s): Unknown   Other Itching   Strawberry , white potatoes, corn, soy, cat dander, fish   Shellfish Allergy    Naproxen Sodium Other (See Comments)   Hurt all over   Penicillins Rash   Has patient had a PCN  reaction causing immediate rash, facial/tongue/throat swelling, SOB or lightheadedness with hypotension: Yes Has patient had a PCN reaction causing severe rash involving mucus membranes or skin necrosis: Yes Has patient had a PCN reaction that required hospitalization: No Has patient had a PCN reaction occurring within the last 10 years: No If all of the above answers are "NO", then may proceed with Cephalosporin use.   Promethazine Hcl Other (See Comments)   Sick       Medication List    TAKE these medications   cefdinir 300 MG capsule Commonly known as: OMNICEF Take 1 capsule (300 mg total) by mouth 2 (two) times daily for 5 days.   flecainide 100 MG tablet Commonly known as: TAMBOCOR Take 1 tablet by mouth twice daily   metoprolol tartrate 50 MG tablet Commonly known as: LOPRESSOR Take 0.5 tablets (25 mg total) by mouth 2 (two) times daily. Notes to patient: You must check your Heart Rate before taking!  Do not take if heart rate is lower than 60.    omeprazole 10 MG capsule Commonly known as: PRILOSEC Take 10 mg by mouth daily as needed (acid).   ondansetron 4 MG tablet Commonly known as: ZOFRAN Take 1 tablet (4 mg total) by mouth every 6 (six) hours as needed for nausea.   warfarin 5 MG tablet Commonly known as: COUMADIN Take as directed. If you are unsure how to take this medication, talk to your nurse or doctor. Original instructions: TAKE 1/2 TO 1 (ONE-HALF TO ONE) TABLET BY MOUTH ONCE DAILY AS  DIRECTED  BY  COUMADIN  CLINIC What changed:   how much to take  how to take this  when to take this  additional instructions       Follow-up Information    Kelton Pillar, MD. Schedule an appointment as soon as possible for a visit.   Specialty: Family Medicine Contact information: [address] [place] [address] [phone]        Minus Breeding, MD .   Specialty: Cardiology Contact information: [address] [place] [address] [phone]        Milus Banister, MD. Schedule an appointment as soon as possible for a visit in 2 week(s).   Specialty: Gastroenterology Contact information: [address] [address] [phone]              Allergies  Allergen Reactions  . Chocolate Itching    Itching mouth  . Erythromycin     Other reaction(s): Unknown  . Other Itching    Strawberry , white potatoes, corn, soy, cat dander, fish  . Shellfish Allergy   . Naproxen Sodium Other (See Comments)    Hurt all over  . Penicillins Rash    Has patient had a PCN reaction causing immediate rash, facial/tongue/throat swelling, SOB or lightheadedness with hypotension: Yes Has patient had a PCN reaction causing severe rash involving mucus membranes or skin necrosis: Yes Has patient had a PCN reaction that required hospitalization: No Has patient had a PCN reaction occurring within the last 10  years: No If all of the above answers are "NO", then may proceed with Cephalosporin use.   . Promethazine Hcl Other (See Comments)    Sick     Consultations:  Gastroenterology  Procedures/Studies: DG ERCP BILIARY & PANCREATIC DUCTS  Result Date: 07/15/2020 CLINICAL DATA:  75 year old female undergoing ERCP. EXAM: ERCP TECHNIQUE: Multiple spot images obtained with the fluoroscopic device and submitted for interpretation post-procedure. FLUOROSCOPY TIME:  Number of Acquired Spot Images: 4 COMPARISON:  MRCP from 05/25/2020 FINDINGS: Duodenal scope position in the second portion the duodenum with retrograde cannulation of the common bile duct. Cholangiogram demonstrates normal caliber, patent central intrahepatic and extrahepatic bile ducts. Status post cholecystectomy. Balloon dilation of the distal common bile duct/anchors performed. IMPRESSION: Endoscopic retrograde cholangiogram with balloon dilation of the distal common bile duct. These images were submitted for radiologic  interpretation only. Please see the procedural report for the amount of contrast and the fluoroscopy time utilized. Electronically Signed   By: Ruthann Cancer MD   On: 07/15/2020 09:52   MM 3D SCREEN BREAST BILATERAL  Result Date: 06/19/2020 CLINICAL DATA:  Screening. EXAM: DIGITAL SCREENING BILATERAL MAMMOGRAM WITH TOMOSYNTHESIS AND CAD TECHNIQUE: Bilateral screening digital craniocaudal and mediolateral oblique mammograms were obtained. Bilateral screening digital breast tomosynthesis was performed. The images were evaluated with computer-aided detection. COMPARISON:  Previous exam(s). ACR Breast Density Category b: There are scattered areas of fibroglandular density. FINDINGS: There are no findings suspicious for malignancy. The images were evaluated with computer-aided detection. IMPRESSION: No mammographic evidence of malignancy. A result letter of this screening mammogram will be mailed directly to the patient. RECOMMENDATION: Screening mammogram in one year. (Code:SM-B-01Y) BI-RADS CATEGORY  1: Negative. Electronically Signed   By: Lillia Mountain M.D.   On: 06/19/2020 11:24     Subjective: Patient was seen and examined today.  Denies any abdominal pain.  No nausea or vomiting.  Able to tolerate liquids.  Willing to try solid diet-she was able to tolerate soft diet later on and willing to go home. Husband and son at bedside.  Discussed about outpatient GI follow-up in 2 weeks.  Discharge Exam: Vitals:   07/15/20 0314 07/15/20 0805  BP: (!) 140/51 (!) 157/51  Pulse: 75 (!) 53  Resp: 15 15  Temp: 97.9 F (36.6 C) 98.4 F (36.9 C)  SpO2: 95% 98%   Vitals:   07/14/20 2116 07/14/20 2314 07/15/20 0314 07/15/20 0805  BP:  (!) 137/49 (!) 140/51 (!) 157/51  Pulse: (!) 56 75 75 (!) 53  Resp:  20 15 15   Temp:  97.7 F (36.5 C) 97.9 F (36.6 C) 98.4 F (36.9 C)  TempSrc:  Oral Oral Oral  SpO2:  96% 95% 98%  Weight:      Height:        General: Pt is alert, awake, not in acute  distress Cardiovascular: RRR, S1/S2 +, no rubs, no gallops Respiratory: CTA bilaterally, no wheezing, no rhonchi Abdominal: Soft, NT, ND, bowel sounds + Extremities: no edema, no cyanosis   The results of significant diagnostics from this hospitalization (including imaging, microbiology, ancillary and laboratory) are listed below for reference.    Microbiology: Recent Results (from the past 240 hour(s))  Resp Panel by RT-PCR (Flu A&B, Covid) Nasopharyngeal Swab     Status: None   Collection Time: 07/12/20  8:09 PM   Specimen: Nasopharyngeal Swab; Nasopharyngeal(NP) swabs in vial transport medium  Result Value Ref Range Status   SARS Coronavirus 2 by RT PCR NEGATIVE NEGATIVE Final  Comment: (NOTE) SARS-CoV-2 target nucleic acids are NOT DETECTED.  The SARS-CoV-2 RNA is generally detectable in upper respiratory specimens during the acute phase of infection. The lowest concentration of SARS-CoV-2 viral copies this assay can detect is 138 copies/mL. A negative result does not preclude SARS-Cov-2 infection and should not be used as the sole basis for treatment or other patient management decisions. A negative result may occur with  improper specimen collection/handling, submission of specimen other than nasopharyngeal swab, presence of viral mutation(s) within the areas targeted by this assay, and inadequate number of viral copies(<138 copies/mL). A negative result must be combined with clinical observations, patient history, and epidemiological information. The expected result is Negative.  Fact Sheet for Patients:  EntrepreneurPulse.com.au  Fact Sheet for Healthcare Providers:  IncredibleEmployment.be  This test is no t yet approved or cleared by the [place] FDA and  has been authorized for detection and/or diagnosis of SARS-CoV-2 by FDA under an Emergency Use Authorization (EUA). This EUA will remain  in effect (meaning this test can  be used) for the duration of the COVID-19 declaration under Section 564(b)(1) of the Act, 21 U.S.C.section 360bbb-3(b)(1), unless the authorization is terminated  or revoked sooner.       Influenza A by PCR NEGATIVE NEGATIVE Final   Influenza B by PCR NEGATIVE NEGATIVE Final    Comment: (NOTE) The Xpert Xpress SARS-CoV-2/FLU/RSV plus assay is intended as an aid in the diagnosis of influenza from Nasopharyngeal swab specimens and should not be used as a sole basis for treatment. Nasal washings and aspirates are unacceptable for Xpert Xpress SARS-CoV-2/FLU/RSV testing.  Fact Sheet for Patients: EntrepreneurPulse.com.au  Fact Sheet for Healthcare Providers: IncredibleEmployment.be  This test is not yet approved or cleared by the [place] FDA and has been authorized for detection and/or diagnosis of SARS-CoV-2 by FDA under an Emergency Use Authorization (EUA). This EUA will remain in effect (meaning this test can be used) for the duration of the COVID-19 declaration under Section 564(b)(1) of the Act, 21 U.S.C. section 360bbb-3(b)(1), unless the authorization is terminated or revoked.  Performed at [facility], [place] [address] [place], [place] [address]      Labs: BNP (last 3 results) No results for input(s): BNP in the last 8760 hours. Basic Metabolic Panel: Recent Labs  Lab 07/12/20 1815 07/13/20 0242 07/13/20 1621 07/14/20 0201 07/15/20 0110  NA 134* 134*  --  137 134*  K 3.3* 3.2*  --  3.9 3.8  CL 99 100  --  103 99  CO2 27 26  --  26 27  GLUCOSE 115* 132*  --  86 106*  BUN 18 18  --  16 18  CREATININE 1.42* 1.35*  --  1.21* 1.36*  CALCIUM 8.9 8.7*  --  8.6* 8.7*  MG  --   --  1.9  --   --    Liver Function Tests: Recent Labs  Lab 07/12/20 1041 07/12/20 1815 07/13/20 0242 07/14/20 0201 07/15/20 0110  AST 118* 142* 127* 105* 99*  ALT 71* 81* 73* 58* 59*  ALKPHOS 554* 537* 455* 401* 381*  BILITOT 13.7*  15.2* 12.7* 10.7* 11.3*  PROT 6.8 7.4 6.6 6.2* 6.3*  ALBUMIN 3.1* 2.7* 2.4* 2.2* 2.4*   Recent Labs  Lab 07/12/20 1815 07/12/20 2004  LIPASE 25 25   No results for input(s): AMMONIA in the last 168 hours. CBC: Recent Labs  Lab 07/12/20 1815 07/13/20 0242 07/14/20 0201 07/15/20 0110  WBC 12.3* 13.3* 10.9*  10.5  HGB 13.1 11.9* 11.1* 10.5*  HCT 38.2 34.6* 32.2* 30.5*  MCV 89.5 88.5 86.6 88.7  PLT 247 241 241 246   Cardiac Enzymes: No results for input(s): CKTOTAL, CKMB, CKMBINDEX, TROPONINI in the last 168 hours. BNP: Invalid input(s): POCBNP CBG: No results for input(s): GLUCAP in the last 168 hours. D-Dimer No results for input(s): DDIMER in the last 72 hours. Hgb A1c No results for input(s): HGBA1C in the last 72 hours. Lipid Profile No results for input(s): CHOL, HDL, LDLCALC, TRIG, CHOLHDL, LDLDIRECT in the last 72 hours. Thyroid function studies Recent Labs    07/14/20 1043  TSH 2.186   Anemia work up No results for input(s): VITAMINB12, FOLATE, FERRITIN, TIBC, IRON, RETICCTPCT in the last 72 hours. Urinalysis    Component Value Date/Time   COLORURINE YELLOW 06/21/2018 1255   APPEARANCEUR HAZY (A) 06/21/2018 1255   LABSPEC 1.006 06/21/2018 1255   PHURINE 6.0 06/21/2018 1255   GLUCOSEU NEGATIVE 06/21/2018 1255   HGBUR SMALL (A) 06/21/2018 1255   BILIRUBINUR NEGATIVE 06/21/2018 1255   [place] 06/21/2018 1255   PROTEINUR 30 (A) 06/21/2018 1255   NITRITE NEGATIVE 06/21/2018 1255   LEUKOCYTESUR NEGATIVE 06/21/2018 1255   Sepsis Labs Invalid input(s): PROCALCITONIN,  WBC,  LACTICIDVEN Microbiology Recent Results (from the past 240 hour(s))  Resp Panel by RT-PCR (Flu A&B, Covid) Nasopharyngeal Swab     Status: None   Collection Time: 07/12/20  8:09 PM   Specimen: Nasopharyngeal Swab; Nasopharyngeal(NP) swabs in vial transport medium  Result Value Ref Range Status   SARS Coronavirus 2 by RT PCR NEGATIVE NEGATIVE Final    Comment:  (NOTE) SARS-CoV-2 target nucleic acids are NOT DETECTED.  The SARS-CoV-2 RNA is generally detectable in upper respiratory specimens during the acute phase of infection. The lowest concentration of SARS-CoV-2 viral copies this assay can detect is 138 copies/mL. A negative result does not preclude SARS-Cov-2 infection and should not be used as the sole basis for treatment or other patient management decisions. A negative result may occur with  improper specimen collection/handling, submission of specimen other than nasopharyngeal swab, presence of viral mutation(s) within the areas targeted by this assay, and inadequate number of viral copies(<138 copies/mL). A negative result must be combined with clinical observations, patient history, and epidemiological information. The expected result is Negative.  Fact Sheet for Patients:  EntrepreneurPulse.com.au  Fact Sheet for Healthcare Providers:  IncredibleEmployment.be  This test is no t yet approved or cleared by the [place] FDA and  has been authorized for detection and/or diagnosis of SARS-CoV-2 by FDA under an Emergency Use Authorization (EUA). This EUA will remain  in effect (meaning this test can be used) for the duration of the COVID-19 declaration under Section 564(b)(1) of the Act, 21 U.S.C.section 360bbb-3(b)(1), unless the authorization is terminated  or revoked sooner.       Influenza A by PCR NEGATIVE NEGATIVE Final   Influenza B by PCR NEGATIVE NEGATIVE Final    Comment: (NOTE) The Xpert Xpress SARS-CoV-2/FLU/RSV plus assay is intended as an aid in the diagnosis of influenza from Nasopharyngeal swab specimens and should not be used as a sole basis for treatment. Nasal washings and aspirates are unacceptable for Xpert Xpress SARS-CoV-2/FLU/RSV testing.  Fact Sheet for Patients: EntrepreneurPulse.com.au  Fact Sheet for Healthcare  Providers: IncredibleEmployment.be  This test is not yet approved or cleared by the [place] FDA and has been authorized for detection and/or diagnosis of SARS-CoV-2 by FDA under an Emergency Use Authorization (  EUA). This EUA will remain in effect (meaning this test can be used) for the duration of the COVID-19 declaration under Section 564(b)(1) of the Act, 21 U.S.C. section 360bbb-3(b)(1), unless the authorization is terminated or revoked.  Performed at [facility], [place] [address] [place], [place] [address]     Time coordinating discharge: Over 30 minutes  SIGNED:  Lorella Nimrod, MD  [facility] 07/15/2020, 12:27 PM  If 7PM-7AM, please contact night-coverage [email or web address]  This record has been created using Systems analyst. Errors have been sought and corrected,but may not always be located. Such creation errors do not reflect on the standard of care.

## 2020-07-15 NOTE — Progress Notes (Signed)
[name]  Chief Complaint: Jaundice  History:  [name] was seen with her husband at the bedside for the entire visit.  She reports feeling better today, with no abdominal pain.  Her appetite is good and she has been able to consume regular diet since the procedure yesterday. She denies chest pain dyspnea or dysuria.  Both she and her husband were grateful for the help she had received thus far and hoping for some further explanation about the endoscopy findings and because of her trouble.  Objective:   Current Facility-Administered Medications:  .  0.9 %  sodium chloride infusion (Manually program via Guardrails IV Fluids), , Intravenous, Once, Milus Banister, MD .  0.9 %  sodium chloride infusion, , Intravenous, PRN, Milus Banister, MD, Last Rate: 10 mL/hr at 07/13/20 2233, 250 mL at 07/13/20 2233 .  cefTRIAXone (ROCEPHIN) 2 g in sodium chloride 0.9 % 100 mL IVPB, 2 g, Intravenous, Q24H, Milus Banister, MD, Last Rate: 200 mL/hr at 07/14/20 2109, 2 g at 07/14/20 2109 .  flecainide (TAMBOCOR) tablet 100 mg, 100 mg, Oral, BID, Milus Banister, MD, 100 mg at 07/15/20 6160 .  hydrALAZINE (APRESOLINE) tablet 25 mg, 25 mg, Oral, Q6H PRN, Regalado, Belkys A, MD .  lactated ringers infusion, , Intravenous, Continuous, Milus Banister, MD, Last Rate: 100 mL/hr at 07/14/20 1308, Restarted at 07/14/20 1432 .  lactated ringers infusion, , Intravenous, Continuous, Milus Banister, MD, Last Rate: 10 mL/hr at 07/14/20 1234, New Bag at 07/14/20 1234 .  metoprolol tartrate (LOPRESSOR) tablet 12.5 mg, 12.5 mg, Oral, BID, Milus Banister, MD .  ondansetron [facility]) tablet 4 mg, 4 mg, Oral, Q6H PRN **OR** ondansetron (ZOFRAN) injection 4 mg, 4 mg, Intravenous, Q6H PRN, Milus Banister, MD  . sodium chloride 250 mL (07/13/20 2233)  . cefTRIAXone (ROCEPHIN)  IV 2 g (07/14/20 2109)  . lactated ringers 100 mL/hr at 07/14/20 1308  . lactated ringers 10 mL/hr at 07/14/20 1234     Vital  signs in last 24 hrs: Vitals:   07/15/20 0314 07/15/20 0805  BP: (!) 140/51 (!) 157/51  Pulse: 75 (!) 53  Resp: 15 15  Temp: 97.9 F (36.6 C) 98.4 F (36.9 C)  SpO2: 95% 98%    Intake/Output Summary (Last 24 hours) at 07/15/2020 1233 Last data filed at 07/14/2020 2000 Gross per 24 hour  Intake 1961.32 ml  Output --  Net 1961.32 ml     Physical Exam Laying comfortably in bed  HEENT: sclera icteric, oral mucosa without lesions  Neck: supple, no thyromegaly, JVD or lymphadenopathy  Cardiac: RRR without murmurs, S1S2 heard, no peripheral edema  Pulm: clear to auscultation bilaterally, normal RR and effort noted  Abdomen: soft, obese, no tenderness, with active bowel sounds.  Difficult to assess mass or organomegaly due to abdominal girth  Skin; warm and dry, + jaundice Mental status is normal. Recent Labs:  CBC Latest Ref Rng & Units 07/15/2020 07/14/2020 07/13/2020  WBC 4.0 - 10.5 K/uL 10.5 10.9(H) 13.3(H)  Hemoglobin 12.0 - 15.0 g/dL 10.5(L) 11.1(L) 11.9(L)  Hematocrit 36.0 - 46.0 % 30.5(L) 32.2(L) 34.6(L)  Platelets 150 - 400 K/uL 246 241 241    Recent Labs  Lab 07/14/20 1043  INR 1.6*   CMP Latest Ref Rng & Units 07/15/2020 07/14/2020 07/13/2020  Glucose 70 - 99 mg/dL 106(H) 86 132(H)  BUN 8 - 23 mg/dL 18 16 18   Creatinine 0.44 - 1.00 mg/dL 1.36(H) 1.21(H) 1.35(H)  Sodium  135 - 145 mmol/L 134(L) 137 134(L)  Potassium 3.5 - 5.1 mmol/L 3.8 3.9 3.2(L)  Chloride 98 - 111 mmol/L 99 103 100  CO2 22 - 32 mmol/L 27 26 26   Calcium 8.9 - 10.3 mg/dL 8.7(L) 8.6(L) 8.7(L)  Total Protein 6.5 - 8.1 g/dL 6.3(L) 6.2(L) 6.6  Total Bilirubin 0.3 - 1.2 mg/dL 11.3(H) 10.7(H) 12.7(H)  Alkaline Phos 38 - 126 U/L 381(H) 401(H) 455(H)  AST 15 - 41 U/L 99(H) 105(H) 127(H)  ALT 0 - 44 U/L 59(H) 58(H) 73(H)     Radiologic studies:  EUS and ERCP reports reviewed, case discussed with Dr. Ardis Hughs as well.  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 Abnormal imaging GI suggesting cystic lesion pancreatic head on MR 2 months ago, but findings from yesterday indicate these are postoperative changes and no mass or fluid collection seen on EUS.  Patient is alkaline phosphatase is down today.  Not surprising bilirubin is up just a little from the procedures yesterday.  Thus far no evidence of Po sphincterotomy bleeding, renal function stable.   She looks and feels well, and from a GI standpoint she can be discharged today if agreeable to the internal medicine service.  Dr. Ardis Hughs recommended 5 days of ciprofloxacin 500 mg twice daily  Her Coumadin can be resumed at regular dose tomorrow and she will need follow-up with the Coumadin clinic next week to check INR.  Our office will contact her next week to arrange repeat LFTs in about a week.  Thanks much to our medicine colleagues for all their assistance.  I spent a total of 25 minutes with the patient reviewing hospital notes, imaging reports, pathology (if applicable),  labs and examining the patient as well as establishing an assessment and plan that was discussed with the patient and her husband.  > 50% of time was spent in direct patient care.   All questions were answered to their [facility] Office: [phone]

## 2020-07-17 ENCOUNTER — Other Ambulatory Visit: Payer: Self-pay

## 2020-07-17 ENCOUNTER — Encounter (HOSPITAL_COMMUNITY): Payer: Self-pay | Admitting: Gastroenterology

## 2020-07-17 ENCOUNTER — Telehealth: Payer: Self-pay

## 2020-07-17 DIAGNOSIS — R7989 Other specified abnormal findings of blood chemistry: Secondary | ICD-10-CM

## 2020-07-17 DIAGNOSIS — R9389 Abnormal findings on diagnostic imaging of other specified body structures: Secondary | ICD-10-CM

## 2020-07-17 NOTE — Telephone Encounter (Signed)
-----   Message from Doran Stabler, MD sent at 07/15/2020 12:38 PM EDT ----- [name],  Patient will be discharged over this weekend.  Needs LFTs Monday, April 18  [name], patient had her EUS and ERCP as an inpatient, please take off DJs schedule from early May  - HD

## 2020-07-17 NOTE — Telephone Encounter (Signed)
Lab order and reminder in epic.  

## 2020-07-24 ENCOUNTER — Ambulatory Visit (INDEPENDENT_AMBULATORY_CARE_PROVIDER_SITE_OTHER): Payer: Medicare PPO

## 2020-07-24 ENCOUNTER — Telehealth: Payer: Self-pay

## 2020-07-24 ENCOUNTER — Other Ambulatory Visit: Payer: Self-pay

## 2020-07-24 DIAGNOSIS — I480 Paroxysmal atrial fibrillation: Secondary | ICD-10-CM

## 2020-07-24 DIAGNOSIS — Z5181 Encounter for therapeutic drug level monitoring: Secondary | ICD-10-CM

## 2020-07-24 LAB — POCT INR: INR: 1.4 — AB (ref 2.0–3.0)

## 2020-07-24 NOTE — Patient Instructions (Signed)
Take 1.5 tablets today only and  then continue with 5 mg daily except 2.5 mg each Monday, Wednesday and Friday.   Repeat INR in 10 days

## 2020-07-24 NOTE — Telephone Encounter (Signed)
Spoke with patient to remind her that she is due for repeat labs today. Patient is aware that no appt is necessary, she can stop by at her convenience before 5 PM today. Patient verbalized understanding and had no concerns at the end of the call.

## 2020-07-24 NOTE — Telephone Encounter (Signed)
-----   Message from Yevette Edwards, RN sent at 07/17/2020 11:25 AM EDT ----- Regarding: Labs Repeat hepatic function panel on 4/18. Order in epic.

## 2020-07-25 ENCOUNTER — Other Ambulatory Visit: Payer: Medicare PPO

## 2020-07-25 DIAGNOSIS — R7989 Other specified abnormal findings of blood chemistry: Secondary | ICD-10-CM

## 2020-07-25 DIAGNOSIS — R9389 Abnormal findings on diagnostic imaging of other specified body structures: Secondary | ICD-10-CM

## 2020-07-25 LAB — HEPATIC FUNCTION PANEL
ALT: 57 U/L — ABNORMAL HIGH (ref 0–35)
AST: 100 U/L — ABNORMAL HIGH (ref 0–37)
Albumin: 3.1 g/dL — ABNORMAL LOW (ref 3.5–5.2)
Alkaline Phosphatase: 485 U/L — ABNORMAL HIGH (ref 39–117)
Bilirubin, Direct: 3.4 mg/dL — ABNORMAL HIGH (ref 0.0–0.3)
Total Bilirubin: 6.4 mg/dL — ABNORMAL HIGH (ref 0.2–1.2)
Total Protein: 7.3 g/dL (ref 6.0–8.3)

## 2020-08-01 ENCOUNTER — Other Ambulatory Visit: Payer: Self-pay

## 2020-08-01 DIAGNOSIS — R948 Abnormal results of function studies of other organs and systems: Secondary | ICD-10-CM

## 2020-08-01 DIAGNOSIS — R9389 Abnormal findings on diagnostic imaging of other specified body structures: Secondary | ICD-10-CM

## 2020-08-01 DIAGNOSIS — R1013 Epigastric pain: Secondary | ICD-10-CM

## 2020-08-01 DIAGNOSIS — R7989 Other specified abnormal findings of blood chemistry: Secondary | ICD-10-CM

## 2020-08-02 NOTE — Progress Notes (Signed)
Cardiology Office Note   Date:  08/03/2020   ID:  Reis, Pienta November 05, 1945, MRN [id number]  PCP:  Kelton Pillar, MD  Cardiologist:   Minus Breeding, MD   Chief Complaint  Patient presents with  . Dizziness      History of Present Illness:  [name] is a 75 y.o. female who presents for follow up of atrial fib.   At the last visit her metoprolol was decreased because of bradycardia.  Cozaar was added because of hypertension.  Since we last saw her she did have a treadmill test just to follow-up for flecainide treatment.  There were no inducible arrhythmias or EKG changes.  It was a submaximal test as she did not reach the target heart rate.  There is no evidence of ischemia.    She returns for one year follow up.   She says that she is very dizzy.  She feels this when she is standing or going from seated to standing.  She has to hold something to get her bearing and not pass out.  She has had no syncope.  She does not have palpitations.  She does not really notice this if she is seated.  It looks like her metoprolol was decreased previously may be because of this.  I do see that Cardizem which was to be as needed is no longer on her list.  She is not having any new shortness of breath, PND or orthopnea.  She is not having any palpitations.  She denies any chest pressure, neck or arm discomfort.  She is limited by back problems.  Of note I did review GI records and recent labs.  She is been progressively anemic.  She has had some problems with cholecystitis and gallstones.  Past Medical History:  Diagnosis Date  . Borderline hyperlipidemia   . Borderline hypertension   . Complication of anesthesia    PONV  . Hypertension   . Hypothyroid    s/p PTU therapy  . PAF (paroxysmal atrial fibrillation) ([place])     Past Surgical History:  Procedure Laterality Date  . BILIARY DILATION  07/14/2020   Procedure: BILIARY DILATION;  Surgeon: Milus Banister, MD;  Location: [facility]  ENDOSCOPY;  Service: Endoscopy;;  . CESAREAN SECTION    . CHOLECYSTECTOMY    . ENDOSCOPIC RETROGRADE CHOLANGIOPANCREATOGRAPHY (ERCP) WITH PROPOFOL N/A 07/14/2020   Procedure: ENDOSCOPIC RETROGRADE CHOLANGIOPANCREATOGRAPHY (ERCP) WITH PROPOFOL;  Surgeon: Milus Banister, MD;  Location: [facility] ENDOSCOPY;  Service: Endoscopy;  Laterality: N/A;  . ESOPHAGOGASTRODUODENOSCOPY (EGD) WITH PROPOFOL N/A 07/14/2020   Procedure: ESOPHAGOGASTRODUODENOSCOPY (EGD) WITH PROPOFOL;  Surgeon: Milus Banister, MD;  Location: [facility] ENDOSCOPY;  Service: Endoscopy;  Laterality: N/A;  . KNEE SURGERY     right 2014, left 2015, arthroscipic  . LEG SURGERY     Left leg d/t  tumor (benign)  . REMOVAL OF STONES  07/14/2020   Procedure: REMOVAL OF STONES;  Surgeon: Milus Banister, MD;  Location: [facility] ENDOSCOPY;  Service: Endoscopy;;  . [name]  07/14/2020   Procedure: [name];  Surgeon: Milus Banister, MD;  Location: [place];  Service: Endoscopy;;  . THYROID SURGERY    . UPPER ESOPHAGEAL ENDOSCOPIC ULTRASOUND (EUS) N/A 07/14/2020   Procedure: UPPER ESOPHAGEAL ENDOSCOPIC ULTRASOUND (EUS);  Surgeon: Milus Banister, MD;  Location: [facility] ENDOSCOPY;  Service: Endoscopy;  Laterality: N/A;     Current Outpatient Medications  Medication Sig Dispense Refill  . flecainide (TAMBOCOR) 100 MG tablet Take 1  tablet by mouth twice daily (Patient taking differently: Take 100 mg by mouth 2 (two) times daily.) 180 tablet 0  . omeprazole (PRILOSEC) 10 MG capsule Take 10 mg by mouth daily as needed (acid).     . ondansetron (ZOFRAN) 4 MG tablet Take 1 tablet (4 mg total) by mouth every 6 (six) hours as needed for nausea. 20 tablet 0  . warfarin (COUMADIN) 5 MG tablet TAKE 1/2 TO 1 (ONE-HALF TO ONE) TABLET BY MOUTH ONCE DAILY AS  DIRECTED  BY  COUMADIN  CLINIC (Patient taking differently: Take 2.5-5 mg by mouth See admin instructions. Takes 2.5 mg  on Mondays , Wednesdays and Fridays and 5 mg on all other days) 90 tablet 0   No  current facility-administered medications for this visit.    Allergies:   Chocolate, Erythromycin, Other, Shellfish allergy, Naproxen sodium, Penicillins, and Promethazine hcl    ROS:  Please see the history of present illness.   Otherwise, review of systems are positive for none.   All other systems are reviewed and negative.    PHYSICAL EXAM: VS:  BP 130/74   Pulse (!) 56   Ht 4\' 11"  (1.499 m)   Wt 213 lb 3.2 oz (96.7 kg)   SpO2 99%   BMI 43.06 kg/m  , BMI Body mass index is 43.06 kg/m. GEN:  No distress NECK:  No jugular venous distention at 90 degrees, waveform within normal limits, carotid upstroke brisk and symmetric, no bruits, no thyromegaly LYMPHATICS:  No cervical adenopathy LUNGS:  Clear to auscultation bilaterally BACK:  No CVA tenderness CHEST:  Unremarkable HEART:  S1 and S2 within normal limits, no S3, no S4, no clicks, no rubs, no murmurs ABD:  Positive bowel sounds normal in frequency in pitch, no bruits, no rebound, no guarding, unable to assess midline mass or bruit with the patient seated. EXT:  2 plus pulses throughout, moderate edema, no cyanosis no clubbing SKIN:  No rashes no nodules NEURO:  Cranial nerves II through XII grossly intact, motor grossly intact throughout PSYCH:  Cognitively intact, oriented to person place and time  EKG:  EKG is ordered today.   Recent Labs: 07/13/2020: Magnesium 1.9 07/14/2020: TSH 2.186 07/15/2020: BUN 18; Creatinine, Ser 1.36; Hemoglobin 10.5; Platelets 246; Potassium 3.8; Sodium 134 07/25/2020: ALT 57    Lipid Panel    Component Value Date/Time   CHOL  05/27/2008 0700    149        ATP III CLASSIFICATION:  <200     mg/dL   Desirable  200-239  mg/dL   Borderline High  >=240    mg/dL   High          TRIG 54 05/27/2008 0700   HDL 51 05/27/2008 0700   CHOLHDL 2.9 05/27/2008 0700   VLDL 11 05/27/2008 0700   LDLCALC  05/27/2008 0700    87        Total Cholesterol/HDL:CHD Risk Coronary Heart Disease Risk Table                      Men   Women  1/2 Average Risk   3.4   3.3  Average Risk       5.0   4.4  2 X Average Risk   9.6   7.1  3 X Average Risk  23.4   11.0        Use the calculated Patient Ratio above and the CHD Risk Table to determine the patient's  CHD Risk.        ATP III CLASSIFICATION (LDL):  <100     mg/dL   Optimal  100-129  mg/dL   Near or Above                    Optimal  130-159  mg/dL   Borderline  160-189  mg/dL   High  >190     mg/dL   Very High      Wt Readings from Last 3 Encounters:  08/03/20 213 lb 3.2 oz (96.7 kg)  07/14/20 219 lb (99.3 kg)  07/02/20 219 lb (99.3 kg)      Other studies Reviewed: Additional studies/ records that were reviewed today include: POET (Plain Old Exercise Treadmill), labs. Review of the above records demonstrates:  Please see elsewhere in the note.     ASSESSMENT AND PLAN:  Paroxysmal atrial fibrillation:    Ms. CHELCY BOLDA has a CHA2DS2 - VASc score of 4.  I do not believe she has had any symptomatic tachyarrhythmias.  I think this is unrelated.  She has seen GI and there is been apparently no contraindication to her continue Coumadin.  I will continue this.  Essential hypertension:  Her blood pressure is controlled.  She is orthostatic today.    Dizziness: She did dropped her blood pressure when she stood up.  I am going to completely get rid of the metoprolol.  She is instructed to hydrate.  She can have her anemia corrected as apparently as planned.  We will see her back in about 1 month to see how she has progressed with this.  There could be other etiologies to her dizziness.  We will work with low blood pressure.  Current medicines are reviewed at length with the patient today.  The patient does not have concerns regarding medicines.  The following changes have been made: None  Labs/ tests ordered today include: None  Orders Placed This Encounter  Procedures  . EKG 12-Lead     Disposition:   FU with me in one month.  Ronnell Guadalajara, MD  08/03/2020 8:22 AM    [facility]

## 2020-08-03 ENCOUNTER — Other Ambulatory Visit: Payer: Self-pay

## 2020-08-03 ENCOUNTER — Encounter: Payer: Self-pay | Admitting: Cardiology

## 2020-08-03 ENCOUNTER — Ambulatory Visit (INDEPENDENT_AMBULATORY_CARE_PROVIDER_SITE_OTHER): Payer: Medicare PPO | Admitting: Pharmacist

## 2020-08-03 ENCOUNTER — Ambulatory Visit: Payer: Medicare PPO | Admitting: Cardiology

## 2020-08-03 VITALS — BP 130/74 | HR 56 | Ht 59.0 in | Wt 213.2 lb

## 2020-08-03 DIAGNOSIS — I480 Paroxysmal atrial fibrillation: Secondary | ICD-10-CM

## 2020-08-03 DIAGNOSIS — I10 Essential (primary) hypertension: Secondary | ICD-10-CM

## 2020-08-03 LAB — POCT INR: INR: 1.5 — AB (ref 2.0–3.0)

## 2020-08-03 NOTE — Patient Instructions (Addendum)
Medication Instructions:  STOP- Metoprolol  *If you need a refill on your cardiac medications before your next appointment, please call your pharmacy*   Lab Work: None Ordered   Testing/Procedures: None Ordered   Follow-Up: At [facility], you and your health needs are our priority.  As part of our continuing mission to provide you with exceptional heart care, we have created designated Provider Care Teams.  These Care Teams include your primary Cardiologist (physician) and Advanced Practice Providers (APPs -  Physician Assistants and Nurse Practitioners) who all work together to provide you with the care you need, when you need it.  We recommend signing up for the patient portal called "MyChart".  Sign up information is provided on this After Visit Summary.  MyChart is used to connect with patients for Virtual Visits (Telemedicine).  Patients are able to view lab/test results, encounter notes, upcoming appointments, etc.  Non-urgent messages can be sent to your provider as well.   To learn more about what you can do with MyChart, go to [email or web address].    Your next appointment:   Friday June 3rd @ 9:45 am  The format for your next appointment:   In Person  Provider:   Coletta Memos, FNP

## 2020-08-04 ENCOUNTER — Other Ambulatory Visit: Payer: Self-pay | Admitting: Cardiology

## 2020-08-07 ENCOUNTER — Other Ambulatory Visit (HOSPITAL_COMMUNITY): Payer: Medicare PPO

## 2020-08-09 ENCOUNTER — Ambulatory Visit (INDEPENDENT_AMBULATORY_CARE_PROVIDER_SITE_OTHER): Payer: Medicare PPO

## 2020-08-09 ENCOUNTER — Other Ambulatory Visit: Payer: Self-pay | Admitting: Gastroenterology

## 2020-08-09 ENCOUNTER — Ambulatory Visit (HOSPITAL_COMMUNITY)
Admission: RE | Admit: 2020-08-09 | Discharge: 2020-08-09 | Disposition: A | Discharge status: Home / Self Care | Payer: Medicare PPO | Source: Ambulatory Visit | Attending: Gastroenterology | Admitting: Gastroenterology

## 2020-08-09 ENCOUNTER — Other Ambulatory Visit: Payer: Self-pay

## 2020-08-09 DIAGNOSIS — R948 Abnormal results of function studies of other organs and systems: Secondary | ICD-10-CM

## 2020-08-09 DIAGNOSIS — I480 Paroxysmal atrial fibrillation: Secondary | ICD-10-CM | POA: Diagnosis not present

## 2020-08-09 DIAGNOSIS — R7989 Other specified abnormal findings of blood chemistry: Secondary | ICD-10-CM

## 2020-08-09 DIAGNOSIS — R1013 Epigastric pain: Secondary | ICD-10-CM | POA: Insufficient documentation

## 2020-08-09 DIAGNOSIS — K8050 Calculus of bile duct without cholangitis or cholecystitis without obstruction: Secondary | ICD-10-CM | POA: Diagnosis not present

## 2020-08-09 DIAGNOSIS — R9389 Abnormal findings on diagnostic imaging of other specified body structures: Secondary | ICD-10-CM | POA: Diagnosis not present

## 2020-08-09 DIAGNOSIS — Z5181 Encounter for therapeutic drug level monitoring: Secondary | ICD-10-CM

## 2020-08-09 DIAGNOSIS — N281 Cyst of kidney, acquired: Secondary | ICD-10-CM | POA: Diagnosis not present

## 2020-08-09 LAB — POCT INR: INR: 1.6 — AB (ref 2.0–3.0)

## 2020-08-09 MED ORDER — GADOBUTROL 1 MMOL/ML IV SOLN
10.0000 mL | Freq: Once | INTRAVENOUS | Status: AC | PRN
  Administered 2020-08-09: 10 mL via INTRAVENOUS

## 2020-08-09 NOTE — Patient Instructions (Signed)
TAKE 10mg  of warfarin today, then INCREASE warfarin dose to 5 mg daily except 2.5mg  each Monday.    08/03/20 *Noted recent INR > 6 , jaundice and elevated LFTs - now resolved* Continue close monitoring and warfarin titration for 2-3 more weeks until stable.

## 2020-08-10 ENCOUNTER — Ambulatory Visit (HOSPITAL_COMMUNITY): Admit: 2020-08-10 | Payer: Medicare PPO | Admitting: Gastroenterology

## 2020-08-10 ENCOUNTER — Encounter (HOSPITAL_COMMUNITY): Payer: Self-pay

## 2020-08-10 SURGERY — UPPER ENDOSCOPIC ULTRASOUND (EUS) RADIAL
Anesthesia: Monitor Anesthesia Care

## 2020-08-14 DIAGNOSIS — M25561 Pain in right knee: Secondary | ICD-10-CM | POA: Diagnosis not present

## 2020-08-15 ENCOUNTER — Telehealth: Payer: Self-pay

## 2020-08-15 NOTE — Telephone Encounter (Signed)
Spoke with patient to remind her that she is due for repeat labs at this time. No appointment is necessary. Patient is aware that she can stop by the lab in the basement at her convenience between 7:30 AM - 5 PM, Monday through Friday. Patient verbalized understanding and had no concerns at the end of the call.   

## 2020-08-15 NOTE — Telephone Encounter (Signed)
-----   Message from Yevette Edwards, RN sent at 08/01/2020  4:37 PM EDT ----- Regarding: Labs Repeat hepatic function panel. Order in epic.

## 2020-08-16 ENCOUNTER — Other Ambulatory Visit: Payer: Self-pay

## 2020-08-16 ENCOUNTER — Other Ambulatory Visit: Payer: Medicare PPO

## 2020-08-16 ENCOUNTER — Ambulatory Visit (INDEPENDENT_AMBULATORY_CARE_PROVIDER_SITE_OTHER): Payer: Medicare PPO

## 2020-08-16 DIAGNOSIS — I480 Paroxysmal atrial fibrillation: Secondary | ICD-10-CM

## 2020-08-16 DIAGNOSIS — Z5181 Encounter for therapeutic drug level monitoring: Secondary | ICD-10-CM

## 2020-08-16 DIAGNOSIS — R948 Abnormal results of function studies of other organs and systems: Secondary | ICD-10-CM

## 2020-08-16 DIAGNOSIS — R9389 Abnormal findings on diagnostic imaging of other specified body structures: Secondary | ICD-10-CM

## 2020-08-16 DIAGNOSIS — R1013 Epigastric pain: Secondary | ICD-10-CM

## 2020-08-16 DIAGNOSIS — R7989 Other specified abnormal findings of blood chemistry: Secondary | ICD-10-CM

## 2020-08-16 LAB — HEPATIC FUNCTION PANEL
ALT: 29 U/L (ref 0–35)
AST: 37 U/L (ref 0–37)
Albumin: 3.6 g/dL (ref 3.5–5.2)
Alkaline Phosphatase: 194 U/L — ABNORMAL HIGH (ref 39–117)
Bilirubin, Direct: 0.8 mg/dL — ABNORMAL HIGH (ref 0.0–0.3)
Total Bilirubin: 1.7 mg/dL — ABNORMAL HIGH (ref 0.2–1.2)
Total Protein: 7.6 g/dL (ref 6.0–8.3)

## 2020-08-16 LAB — POCT INR: INR: 2.2 (ref 2.0–3.0)

## 2020-08-16 NOTE — Patient Instructions (Signed)
Continue taking warfarin dose 5 mg daily except 2.5mg  each Monday. Repeat 4 weeks

## 2020-08-24 ENCOUNTER — Other Ambulatory Visit (INDEPENDENT_AMBULATORY_CARE_PROVIDER_SITE_OTHER): Payer: Medicare PPO

## 2020-08-24 ENCOUNTER — Encounter: Payer: Self-pay | Admitting: Gastroenterology

## 2020-08-24 ENCOUNTER — Ambulatory Visit: Payer: Medicare PPO | Admitting: Gastroenterology

## 2020-08-24 VITALS — BP 140/72 | HR 74 | Ht 59.0 in | Wt 211.2 lb

## 2020-08-24 DIAGNOSIS — R945 Abnormal results of liver function studies: Secondary | ICD-10-CM

## 2020-08-24 DIAGNOSIS — K8050 Calculus of bile duct without cholangitis or cholecystitis without obstruction: Secondary | ICD-10-CM | POA: Diagnosis not present

## 2020-08-24 DIAGNOSIS — D649 Anemia, unspecified: Secondary | ICD-10-CM

## 2020-08-24 DIAGNOSIS — R7989 Other specified abnormal findings of blood chemistry: Secondary | ICD-10-CM

## 2020-08-24 LAB — CBC WITH DIFFERENTIAL/PLATELET
Basophils Absolute: 0.1 10*3/uL (ref 0.0–0.1)
Basophils Relative: 1 % (ref 0.0–3.0)
Eosinophils Absolute: 0.1 10*3/uL (ref 0.0–0.7)
Eosinophils Relative: 1.3 % (ref 0.0–5.0)
HCT: 40.9 % (ref 36.0–46.0)
Hemoglobin: 13.8 g/dL (ref 12.0–15.0)
Lymphocytes Relative: 25.7 % (ref 12.0–46.0)
Lymphs Abs: 2.6 10*3/uL (ref 0.7–4.0)
MCHC: 33.6 g/dL (ref 30.0–36.0)
MCV: 91.9 fl (ref 78.0–100.0)
Monocytes Absolute: 0.9 10*3/uL (ref 0.1–1.0)
Monocytes Relative: 9.1 % (ref 3.0–12.0)
Neutro Abs: 6.4 10*3/uL (ref 1.4–7.7)
Neutrophils Relative %: 62.9 % (ref 43.0–77.0)
Platelets: 227 10*3/uL (ref 150.0–400.0)
RBC: 4.45 Mil/uL (ref 3.87–5.11)
RDW: 14.7 % (ref 11.5–15.5)
WBC: 10.2 10*3/uL (ref 4.0–10.5)

## 2020-08-24 LAB — HEPATIC FUNCTION PANEL
ALT: 24 U/L (ref 0–35)
AST: 26 U/L (ref 0–37)
Albumin: 3.8 g/dL (ref 3.5–5.2)
Alkaline Phosphatase: 176 U/L — ABNORMAL HIGH (ref 39–117)
Bilirubin, Direct: 0.7 mg/dL — ABNORMAL HIGH (ref 0.0–0.3)
Total Bilirubin: 1.4 mg/dL — ABNORMAL HIGH (ref 0.2–1.2)
Total Protein: 7.7 g/dL (ref 6.0–8.3)

## 2020-08-24 NOTE — Progress Notes (Signed)
[facility] Progress Note  Chief Complaint: Choledocholithiasis  Subjective  History:  [name] follows up after hospitalization last month.  She had been seen in clinic prior and had extensive lab work-up for elevated LFTs, MRCP done by primary care did not show CBD stones. However, follow-up labs prior to a planned outpatient ERCP/EUS discovered significant rise in labs, so she was admitted and underwent those procedures with Dr. Ardis Hughs. I discussed the procedures with him and reviewed imaging findings as well.  She had an abnormal distal bile duct anatomy, most likely related to distant cholecystectomy.  There was no neoplasm or cystic growth as suggested on MRCP.  Sphincterotomy was performed and balloon dilation of the ampulla to allow bile duct sweep and clearance of stones. Repeat LFTs about 2 weeks after discharge did not show near as much improvement as would be expected by that point.  Labs are really back to what they had done in February.  Dr. Ardis Hughs and I discussed it and I decided to repeat MRCP with report as below.  (Knowing that it might have limited sensitivity to pick up stone since it had not described any stones on the first study even though they were found on ERCP).  I also wanted to be sure there was no postprocedure complication. She is generally feeling well from a digestive standpoint, but has had persistent orthostatic dizziness for at least several weeks.  She saw Dr. Percival Spanish on April 28 and was found to be orthostatic, so her metoprolol was discontinued.  However, the dizziness continues occluding at today's visit.  She is due to see primary care in the near future and think she has an appointment June 3 with Dr. Percival Spanish.  ROS: Cardiovascular:  no chest pain Respiratory: no dyspnea or cough Denies abdominal pain. Sometimes feels food is "slow to pass" in the lower chest, but no stricture was found on endoscopic procedures. The patient's Past Medical,  Family and Social History were reviewed and are on file in the EMR.  Objective:  Med list reviewed  Current Outpatient Medications:  .  flecainide (TAMBOCOR) 100 MG tablet, Take 1 tablet (100 mg total) by mouth 2 (two) times daily., Disp: 180 tablet, Rfl: 3 .  omeprazole (PRILOSEC) 10 MG capsule, Take 10 mg by mouth daily as needed (acid). , Disp: , Rfl:  .  warfarin (COUMADIN) 5 MG tablet, TAKE 1/2 TO 1 (ONE-HALF TO ONE) TABLET BY MOUTH ONCE DAILY AS  DIRECTED  BY  COUMADIN  CLINIC (Patient taking differently: Take 2.5-5 mg by mouth See admin instructions. Takes 2.5 mg  on Mondays , Wednesdays and Fridays and 5 mg on all other days), Disp: 90 tablet, Rfl: 0   Vital signs in last 24 hrs: Vitals:   08/24/20 0920  BP: 140/72  Pulse: 74   Wt Readings from Last 3 Encounters:  08/24/20 211 lb 3.2 oz (95.8 kg)  08/03/20 213 lb 3.2 oz (96.7 kg)  07/14/20 219 lb (99.3 kg)   I rechecked her blood pressure on the left arm while sitting and got a similar value.  However, when standing she reports almost immediate lightheadedness but is not unsteady on her feet.  Repeat blood pressure was difficult to auscultate in order to get a confident reading.  Her pulse did not significantly rise when standing.  Physical Exam  Well-appearing  HEENT: sclera anicteric, oral mucosa moist without lesions  Neck: supple, no thyromegaly, JVD or lymphadenopathy  Cardiac: RRR without murmurs, S1S2 heard,  no peripheral edema  Pulm: clear to auscultation bilaterally, normal RR and effort noted  Abdomen: soft, obese, no tenderness, with active bowel sounds. No guarding or palpable hepatosplenomegaly (Limited exam since patient's arthritic problems prevented her from getting on the exam table)  Skin; warm and dry, no jaundice or rash  Labs:  CMP Latest Ref Rng & Units 08/16/2020 07/25/2020 07/15/2020  Glucose 70 - 99 mg/dL - - 106(H)  BUN 8 - 23 mg/dL - - 18  Creatinine 0.44 - 1.00 mg/dL - - 1.36(H)  Sodium  135 - 145 mmol/L - - 134(L)  Potassium 3.5 - 5.1 mmol/L - - 3.8  Chloride 98 - 111 mmol/L - - 99  CO2 22 - 32 mmol/L - - 27  Calcium 8.9 - 10.3 mg/dL - - 8.7(L)  Total Protein 6.0 - 8.3 g/dL 7.6 7.3 6.3(L)  Total Bilirubin 0.2 - 1.2 mg/dL 1.7(H) 6.4(H) 11.3(H)  Alkaline Phos 39 - 117 U/L 194(H) 485(H) 381(H)  AST 0 - 37 U/L 37 100(H) 99(H)  ALT 0 - 35 U/L 29 57(H) 59(H)    ___________________________________________ Radiologic studies:  CLINICAL DATA: Recent ERCP with removal of biliary stones. Persistent elevated LFTs. Evaluate for choledocholithiasis   EXAM: MRI ABDOMEN WITHOUT AND WITH CONTRAST (INCLUDING MRCP)   TECHNIQUE: Multiplanar multisequence MR imaging of the abdomen was performed both before and after the administration of intravenous contrast. Heavily T2-weighted images of the biliary and pancreatic ducts were obtained, and three-dimensional MRCP images were rendered by post processing.   CONTRAST: 33mL GADAVIST GADOBUTROL 1 MMOL/ML IV SOLN   COMPARISON: Abdominal MRI 05/25/2020   FINDINGS: Lower chest: Lung bases are clear.   Hepatobiliary: No intrahepatic duct dilatation. No extrahepatic duct dilatation. The common bile duct measures 5 mm in diameter. No filling defect within the common bile duct which tapers normally to the ampulla (image 65/series 7).   Postcholecystectomy.   No hepatic steatosis.   Pancreas: Normal pancreatic parenchymal intensity. No ductal dilatation or inflammation. Fatty replacement throughout the pancreatic parenchyma.   Spleen: Normal spleen.   Adrenals/urinary tract: Adrenal glands normal. Simple cyst of the lower pole LEFT kidney is nonenhancing. Smaller similar cortical cyst on the LEFT.   Stomach/Bowel: Stomach and limited of the small bowel is unremarkable   Vascular/Lymphatic: Abdominal aortic normal caliber. No retroperitoneal periportal lymphadenopathy.   Musculoskeletal: No aggressive osseous lesion    IMPRESSION: 1. No choledocholithiasis. No intrahepatic or extrahepatic biliary duct dilatation. 2. Normal liver parenchyma. 3. No pancreatic inflammation or duct dilatation. Benign fatty replacement throughout the pancreatic parenchyma.     Electronically Signed   By: Suzy Bouchard M.D.   On: 08/09/2020 15:16  ____________________________________________ Other:  Autoimmune liver labs normal  ERCP and EUS reports on file and discussed with Dr. Ardis Hughs _____________________________________________ Assessment & Plan  Assessment: Encounter Diagnoses  Name Primary?  Marland Kitchen LFTs abnormal Yes  . Choledocholithiasis   . Anemia, unspecified type    Elevated LFTs from choledocholithiasis and abnormal postsurgical biliary anatomy.  LFTs finally decreasing, and I think the delayed improvement was likely from some ampullary swelling due to sphincterotomy and balloon dilation and/or perhaps some residual stone disease that finally passed.  She is feeling well other than postural dizziness. Cardiology noted she had been anemic during hospitalization, though she was not at the time of admission and I think it was typical hospitalization dilutional anemia from medicines and IV fluids.  That seems unlikely to be the cause of dizziness in my opinion. She had contacted  primary care at Dr. Percival Spanish suggestion and was told to take some iron. Plan: We will recheck CBC today.  If hemoglobin is normalized, discontinue iron. Recheck hepatic function panel today with hopeful trend of improvement. I will copy this to Dr. Percival Spanish and asked that the patient be seen at their office ASAP for a nurse visit to check orthostatic vital signs.  32 minutes were spent on this encounter (including chart review, history/exam, counseling/coordination of care, and documentation) > 50% of that time was spent on counseling and coordination of care.  Topics discussed included: See above.  Nelida Meuse III

## 2020-08-24 NOTE — Patient Instructions (Signed)
If you are age 73 or older, your body mass index should be between 23-30. Your Body mass index is 42.66 kg/m. If this is out of the aforementioned range listed, please consider follow up with your Primary Care Provider.  If you are age 59 or younger, your body mass index should be between 19-25. Your Body mass index is 42.66 kg/m. If this is out of the aformentioned range listed, please consider follow up with your Primary Care Provider.   Your provider has requested that you go to the basement level for lab work before leaving today. Press "B" on the elevator. The lab is located at the first door on the left as you exit the elevator.  It was a pleasure to see you today!  Thank you for trusting me with your gastrointestinal care!

## 2020-08-28 ENCOUNTER — Telehealth: Payer: Self-pay | Admitting: Cardiology

## 2020-08-28 DIAGNOSIS — I10 Essential (primary) hypertension: Secondary | ICD-10-CM | POA: Diagnosis not present

## 2020-08-28 DIAGNOSIS — E039 Hypothyroidism, unspecified: Secondary | ICD-10-CM | POA: Diagnosis not present

## 2020-08-28 DIAGNOSIS — I4891 Unspecified atrial fibrillation: Secondary | ICD-10-CM | POA: Diagnosis not present

## 2020-08-28 DIAGNOSIS — R42 Dizziness and giddiness: Secondary | ICD-10-CM | POA: Diagnosis not present

## 2020-08-28 NOTE — Telephone Encounter (Signed)
Patient called in, stating that she see PCP and had the readings below. I advised patient that she sounded orthostatic, and it may be a good idea to get in sooner. Patient states she does have dizziness and lightheadedness. Denies chest pain, or swelling. Does mention having some SOB with exertion, but not at rest.  I advised with patient to move slowly when moving positions and trying to walk to decrease falls, staying hydrated, and to keep a BP log over the next few days to bring to appointment.   Will route to MD to make aware for upcoming appointment scheduled 05/26

## 2020-08-28 NOTE — Telephone Encounter (Signed)
Pt c/o BP issue: STAT if pt c/o blurred vision, one-sided weakness or slurred speech  1. What are your last 5 BP readings? laying down 112/68 Pulse 64 and sitting up 102/54 pulse 78 and standing 98/52 pulse 104  2. Are you having any other symptoms (ex. Dizziness, headache, blurred vision, passed out)? Dizzy and lightheaded  3. What is your BP issue? Patient went to see her  PCP today and states she needed to call our office to see if Dr. Percival Spanish would like to see her sooner.

## 2020-08-29 ENCOUNTER — Other Ambulatory Visit: Payer: Self-pay

## 2020-08-29 DIAGNOSIS — R7989 Other specified abnormal findings of blood chemistry: Secondary | ICD-10-CM

## 2020-08-29 DIAGNOSIS — R945 Abnormal results of liver function studies: Secondary | ICD-10-CM

## 2020-08-29 DIAGNOSIS — D649 Anemia, unspecified: Secondary | ICD-10-CM

## 2020-08-29 DIAGNOSIS — K8050 Calculus of bile duct without cholangitis or cholecystitis without obstruction: Secondary | ICD-10-CM

## 2020-08-30 NOTE — Progress Notes (Signed)
Cardiology Office Note   Date:  08/31/2020   ID:  Callyn, Severtson 1945/04/28, MRN [id number]  PCP:  Kelton Pillar, MD  Cardiologist:   Minus Breeding, MD   Chief Complaint  Patient presents with  . Dizziness      History of Present Illness:  [name] is a 75 y.o. female who presents for follow up of atrial fib.   At the last visit her metoprolol was decreased because of bradycardia.  Cozaar was added because of hypertension.  Since we last saw her she did have a treadmill test just to follow-up for flecainide treatment.  There were no inducible arrhythmias or EKG changes.  It was a submaximal test as she did not reach the target heart rate.  There is no evidence of ischemia.   We received a phone call the other day from her PCP because the patient was having dizziness and apparently orthostatic symptoms.   At the last visit she was taken off of metoprolol.  However, she continues to have orthostatic symptoms.  She records these and she does drop her blood pressure into the 90s with sitting.  She gets dizzy when standing and she has to go lie down.  She says she has not had any syncope.  She is not had any chest pressure, neck or arm discomfort.  She has had no new shortness of breath, PND or orthopnea.  She is really not feeling any palpitations.  There have been some lower heart rates.   Past Medical History:  Diagnosis Date  . Borderline hyperlipidemia   . Borderline hypertension   . Complication of anesthesia    PONV  . Hypertension   . Hypothyroid    s/p PTU therapy  . PAF (paroxysmal atrial fibrillation) ([place])     Past Surgical History:  Procedure Laterality Date  . BILIARY DILATION  07/14/2020   Procedure: BILIARY DILATION;  Surgeon: Milus Banister, MD;  Location: [facility] ENDOSCOPY;  Service: Endoscopy;;  . CESAREAN SECTION    . CHOLECYSTECTOMY    . ENDOSCOPIC RETROGRADE CHOLANGIOPANCREATOGRAPHY (ERCP) WITH PROPOFOL N/A 07/14/2020   Procedure: ENDOSCOPIC  RETROGRADE CHOLANGIOPANCREATOGRAPHY (ERCP) WITH PROPOFOL;  Surgeon: Milus Banister, MD;  [facility] ENDOSCOPY;  Service: Endoscopy;  Laterality: N/A;  . ESOPHAGOGASTRODUODENOSCOPY (EGD) WITH PROPOFOL N/A 07/14/2020   Procedure: ESOPHAGOGASTRODUODENOSCOPY (EGD) WITH PROPOFOL;  Surgeon: Milus Banister, MD;  Location: [facility] ENDOSCOPY;  Service: Endoscopy;  Laterality: N/A;  . KNEE SURGERY     right 2014, left 2015, arthroscipic  . LEG SURGERY     Left leg d/t  tumor (benign)  . REMOVAL OF STONES  07/14/2020   Procedure: REMOVAL OF STONES;  Surgeon: Milus Banister, MD;  Location: [facility] ENDOSCOPY;  Service: Endoscopy;;  . [name]  07/14/2020   Procedure: [name];  Surgeon: Milus Banister, MD;  Location: [place];  Service: Endoscopy;;  . THYROID SURGERY    . UPPER ESOPHAGEAL ENDOSCOPIC ULTRASOUND (EUS) N/A 07/14/2020   Procedure: UPPER ESOPHAGEAL ENDOSCOPIC ULTRASOUND (EUS);  Surgeon: Milus Banister, MD;  Location: [facility] ENDOSCOPY;  Service: Endoscopy;  Laterality: N/A;     Current Outpatient Medications  Medication Sig Dispense Refill  . flecainide (TAMBOCOR) 100 MG tablet Take 1 tablet (100 mg total) by mouth 2 (two) times daily. 180 tablet 3  . omeprazole (PRILOSEC) 10 MG capsule Take 10 mg by mouth daily as needed (acid).     . warfarin (COUMADIN) 5 MG tablet TAKE 1/2 TO 1 (ONE-HALF  TO ONE) TABLET BY MOUTH ONCE DAILY AS  DIRECTED  BY  COUMADIN  CLINIC (Patient taking differently: Take 2.5-5 mg by mouth See admin instructions. Takes 2.5 mg  on Mondays , Wednesdays and Fridays and 5 mg on all other days) 90 tablet 0   No current facility-administered medications for this visit.    Allergies:   Chocolate, Erythromycin, Other, Shellfish allergy, Naproxen sodium, Penicillins, and Promethazine hcl    ROS:  Please see the history of present illness.   Otherwise, review of systems are positive for none.   All other systems are reviewed and negative.    PHYSICAL EXAM: VS:  BP (!)  156/80   Pulse 72   Ht 4\' 11"  (1.499 m)   Wt 211 lb (95.7 kg)   SpO2 100%   BMI 42.62 kg/m  , BMI Body mass index is 42.62 kg/m. GENERAL:  Well appearing NECK:  No jugular venous distention, waveform within normal limits, carotid upstroke brisk and symmetric, no bruits, no thyromegaly LUNGS:  Clear to auscultation bilaterally CHEST:  Unremarkable HEART:  PMI not displaced or sustained,S1 and S2 within normal limits, no S3, no S4, no clicks, no rubs, no murmurs ABD:  Flat, positive bowel sounds normal in frequency in pitch, no bruits, no rebound, no guarding, no midline pulsatile mass, no hepatomegaly, no splenomegaly EXT:  2 plus pulses throughout, no edema, no cyanosis no clubbing   EKG:  EKG is  ordered today. Normal sinus rhythm, rate 72, axis within normal limits, intervals within normal limits, poor anterior R wave progression, no acute ST-T wave changes.  Recent Labs: 07/13/2020: Magnesium 1.9 07/14/2020: TSH 2.186 07/15/2020: BUN 18; Creatinine, Ser 1.36; Potassium 3.8; Sodium 134 08/24/2020: ALT 24; Hemoglobin 13.8; Platelets 227.0    Lipid Panel    Component Value Date/Time   CHOL  05/27/2008 0700    149        ATP III CLASSIFICATION:  <200     mg/dL   Desirable  200-239  mg/dL   Borderline High  >=240    mg/dL   High          TRIG 54 05/27/2008 0700   HDL 51 05/27/2008 0700   CHOLHDL 2.9 05/27/2008 0700   VLDL 11 05/27/2008 0700   LDLCALC  05/27/2008 0700    87        Total Cholesterol/HDL:CHD Risk Coronary Heart Disease Risk Table                     Men   Women  1/2 Average Risk   3.4   3.3  Average Risk       5.0   4.4  2 X Average Risk   9.6   7.1  3 X Average Risk  23.4   11.0        Use the calculated Patient Ratio above and the CHD Risk Table to determine the patient's CHD Risk.        ATP III CLASSIFICATION (LDL):  <100     mg/dL   Optimal  100-129  mg/dL   Near or Above                    Optimal  130-159  mg/dL   Borderline  160-189  mg/dL    High  >190     mg/dL   Very High      Wt Readings from Last 3 Encounters:  08/31/20 211 lb (95.7 kg)  08/24/20 211 lb 3.2 oz (95.8 kg)  08/03/20 213 lb 3.2 oz (96.7 kg)      Other studies Reviewed: Additional studies/ records that were reviewed today include: Primary care records Review of the above records demonstrates:  Please see elsewhere in the note.     ASSESSMENT AND PLAN:  Paroxysmal atrial fibrillation:    [name] has a CHA2DS2 - VASc score of 4.    I do not think she is having any symptomatic paroxysms or bradycardia arrhythmias but I am going to apply a 3-day monitor.   Essential hypertension:  Her blood pressure is dropping with standing and supine it is in the 140s to 150s.  She is not on any blood pressure medications.  She needs thigh compression stockings for abdominal compression stockings and we discussed these and I reviewed some of the items that she can purchase at [facility].  She is going to look into these.  We talked about precautions such as not standing for prolonged period, recognizing the symptoms and lying down with this happens can also avoiding rapid changes in position.   Dizziness:   This was related to orthostasis and addressed as above.  Current medicines are reviewed at length with the patient today.  The patient does not have concerns regarding medicines.  The following changes have been made: None  Labs/ tests ordered today include: None  Orders Placed This Encounter  Procedures  . LONG TERM MONITOR (3-14 DAYS)  . EKG 12-Lead     Disposition:   FU with APP in 3 months.    Signed, Minus Breeding, MD  08/31/2020 1:29 PM    [facility]

## 2020-08-31 ENCOUNTER — Ambulatory Visit (INDEPENDENT_AMBULATORY_CARE_PROVIDER_SITE_OTHER): Payer: Medicare PPO | Admitting: Cardiology

## 2020-08-31 ENCOUNTER — Encounter: Payer: Self-pay | Admitting: Cardiology

## 2020-08-31 ENCOUNTER — Other Ambulatory Visit: Payer: Self-pay

## 2020-08-31 ENCOUNTER — Ambulatory Visit (INDEPENDENT_AMBULATORY_CARE_PROVIDER_SITE_OTHER): Payer: Medicare PPO

## 2020-08-31 VITALS — BP 156/80 | HR 72 | Ht 59.0 in | Wt 211.0 lb

## 2020-08-31 DIAGNOSIS — I10 Essential (primary) hypertension: Secondary | ICD-10-CM

## 2020-08-31 DIAGNOSIS — R42 Dizziness and giddiness: Secondary | ICD-10-CM

## 2020-08-31 DIAGNOSIS — I480 Paroxysmal atrial fibrillation: Secondary | ICD-10-CM

## 2020-08-31 NOTE — Patient Instructions (Addendum)
Medication Instructions:  No Changes In Medications at this time.  *If you need a refill on your cardiac medications before your next appointment, please call your pharmacy*  Testing/Procedures:  [place] Monitor Instructions   Your physician has requested you wear your ZIO patch monitor 3 days.   This is a single patch monitor.  [employer] supplies one patch monitor per enrollment.  Additional stickers are not available.   Please do not apply patch if you will be having a Nuclear Stress Test, Echocardiogram, Cardiac CT, MRI, or Chest Xray during the time frame you would be wearing the monitor. The patch cannot be worn during these tests.  You cannot remove and re-apply the ZIO XT patch monitor.   Your ZIO patch monitor will be sent USPS Priority mail from [facility] directly to your home address. The monitor may also be mailed to a PO BOX if home delivery is not available.   It may take 3-5 days to receive your monitor after you have been enrolled.   Once you have received you monitor, please review enclosed instructions.  Your monitor has already been registered assigning a specific monitor serial # to you.   Applying the monitor   Shave hair from upper left chest.   Hold abrader disc by orange tab.  Rub abrader in 40 strokes over left upper chest as indicated in your monitor instructions.   Clean area with 4 enclosed alcohol pads .  Use all pads to assure are is cleaned thoroughly.  Let dry.   Apply patch as indicated in monitor instructions.  Patch will be place under collarbone on left side of chest with arrow pointing upward.   Rub patch adhesive wings for 2 minutes.Remove white label marked "1".  Remove white label marked "2".  Rub patch adhesive wings for 2 additional minutes.   While looking in a mirror, press and release button in center of patch.  A small green light will flash 3-4 times .  This will be your only indicator the monitor has been turned on.      Do not shower for the first 24 hours.  You may shower after the first 24 hours.   Press button if you feel a symptom. You will hear a small click.  Record Date, Time and Symptom in the Patient Log Book.   When you are ready to remove patch, follow instructions on last 2 pages of Patient Log Book.  Stick patch monitor onto last page of Patient Log Book.   Place Patient Log Book in [place] box.  Use locking tab on box and tape box closed securely.  The [employer] and [employer] has [employer] on it.  Please place in mailbox as soon as possible.  Your physician should have your test results approximately 7 days after the monitor has been mailed back to [facility].   Call [place] at [phone] if you have questions regarding your ZIO XT patch monitor.  Call them immediately if you see an orange light blinking on your monitor.   If your monitor falls off in less than 4 days contact our Monitor department at [phone].  If your monitor becomes loose or falls off after 4 days call [employer] at [phone] for suggestions on securing your monitor.   Follow-Up: At [facility], you and your health needs are our priority.  As part of our continuing mission to provide you with exceptional heart care, we have created designated Provider Care  Teams.  These Care Teams include your primary Cardiologist (physician) and Advanced Practice Providers (APPs -  Physician Assistants and Nurse Practitioners) who all work together to provide you with the care you need, when you need it.  Your next appointment:   3 month(s)  The format for your next appointment:   In Person  Provider:   APP  Other Instructions Thigh Wraps or Spanx- these can be purchased at [place].

## 2020-08-31 NOTE — Progress Notes (Unsigned)
Enrolled patient for a 3 day Zio XT monitor to be mailed to patients home  

## 2020-09-02 DIAGNOSIS — R42 Dizziness and giddiness: Secondary | ICD-10-CM

## 2020-09-02 DIAGNOSIS — I10 Essential (primary) hypertension: Secondary | ICD-10-CM | POA: Diagnosis not present

## 2020-09-02 DIAGNOSIS — I480 Paroxysmal atrial fibrillation: Secondary | ICD-10-CM

## 2020-09-08 ENCOUNTER — Ambulatory Visit: Payer: Medicare PPO | Admitting: General Practice

## 2020-09-12 DIAGNOSIS — I10 Essential (primary) hypertension: Secondary | ICD-10-CM | POA: Diagnosis not present

## 2020-09-12 DIAGNOSIS — R42 Dizziness and giddiness: Secondary | ICD-10-CM | POA: Diagnosis not present

## 2020-09-12 DIAGNOSIS — I480 Paroxysmal atrial fibrillation: Secondary | ICD-10-CM | POA: Diagnosis not present

## 2020-09-13 ENCOUNTER — Ambulatory Visit (INDEPENDENT_AMBULATORY_CARE_PROVIDER_SITE_OTHER): Payer: Medicare PPO

## 2020-09-13 ENCOUNTER — Other Ambulatory Visit: Payer: Self-pay

## 2020-09-13 DIAGNOSIS — Z5181 Encounter for therapeutic drug level monitoring: Secondary | ICD-10-CM | POA: Diagnosis not present

## 2020-09-13 DIAGNOSIS — I480 Paroxysmal atrial fibrillation: Secondary | ICD-10-CM | POA: Diagnosis not present

## 2020-09-13 LAB — POCT INR: INR: 1.6 — AB (ref 2.0–3.0)

## 2020-09-13 NOTE — Patient Instructions (Signed)
Take 2 tablets today only and then Continue taking warfarin dose 5 mg daily except 2.5mg  each Monday. Repeat 6 weeks

## 2020-09-14 ENCOUNTER — Telehealth: Payer: Self-pay | Admitting: Cardiology

## 2020-09-14 NOTE — Telephone Encounter (Signed)
Chriss Driver, RN  09/14/2020  2:06 PM EDT      Attempted to call patient, left message for patient to call back to office.    Minus Breeding, MD  09/13/2020  9:13 PM EDT      No evidence of bradycardia or reason for dizziness.  Call Ms. Fahrner with theresults and send results to Kelton Pillar, MD   Patient called w/results She reports dizziness had resolved but has come back -- occurs when she goes from sitting to standing Advised patient she could be orthostatic, should change position slowly Advised she should obtain compression stockings -- she was advised on local places to go for these

## 2020-09-14 NOTE — Telephone Encounter (Signed)
Pt returning call from earlier today, pt states she doesn't have an upcoming appt so she's not sure why she received a call. Please advise

## 2020-09-26 ENCOUNTER — Telehealth: Payer: Self-pay

## 2020-09-26 NOTE — Telephone Encounter (Signed)
Left detailed message for patient to remind her that she is due for repeat labs at this time. Advised patient that no appt is necessary and she can stop by at her convenience between 7:30 am - 5 pm this week. Advised patient that I will send this information to her my chart as well. Patient advised to call or send [place] a message if she has any further questions.    My Chart message sent to patient as well.

## 2020-09-26 NOTE — Telephone Encounter (Signed)
-----   Message from Yevette Edwards, RN sent at 08/29/2020  8:20 AM EDT ----- Regarding: Labs Hepatic function panel, order in epic.

## 2020-09-27 ENCOUNTER — Other Ambulatory Visit: Payer: Medicare PPO

## 2020-09-27 DIAGNOSIS — R7989 Other specified abnormal findings of blood chemistry: Secondary | ICD-10-CM

## 2020-09-27 DIAGNOSIS — K8050 Calculus of bile duct without cholangitis or cholecystitis without obstruction: Secondary | ICD-10-CM

## 2020-09-27 DIAGNOSIS — D649 Anemia, unspecified: Secondary | ICD-10-CM

## 2020-09-27 LAB — HEPATIC FUNCTION PANEL
ALT: 15 U/L (ref 0–35)
AST: 21 U/L (ref 0–37)
Albumin: 3.6 g/dL (ref 3.5–5.2)
Alkaline Phosphatase: 110 U/L (ref 39–117)
Bilirubin, Direct: 0.2 mg/dL (ref 0.0–0.3)
Total Bilirubin: 0.6 mg/dL (ref 0.2–1.2)
Total Protein: 6.9 g/dL (ref 6.0–8.3)

## 2020-10-23 ENCOUNTER — Ambulatory Visit (INDEPENDENT_AMBULATORY_CARE_PROVIDER_SITE_OTHER): Payer: Medicare PPO

## 2020-10-23 ENCOUNTER — Other Ambulatory Visit: Payer: Self-pay

## 2020-10-23 DIAGNOSIS — Z5181 Encounter for therapeutic drug level monitoring: Secondary | ICD-10-CM

## 2020-10-23 DIAGNOSIS — I480 Paroxysmal atrial fibrillation: Secondary | ICD-10-CM

## 2020-10-23 LAB — POCT INR: INR: 1.9 — AB (ref 2.0–3.0)

## 2020-10-23 MED ORDER — WARFARIN SODIUM 5 MG PO TABS: ORAL_TABLET | ORAL | 1 refills | Status: DC

## 2020-10-23 NOTE — Patient Instructions (Signed)
Take 2 tablets today only and then increase warfarin dose to 5 mg daily. Repeat 3 weeks

## 2020-11-09 ENCOUNTER — Other Ambulatory Visit: Payer: Self-pay

## 2020-11-09 ENCOUNTER — Ambulatory Visit
Admission: RE | Admit: 2020-11-09 | Discharge: 2020-11-09 | Disposition: A | Discharge status: Home / Self Care | Payer: Medicare PPO | Source: Ambulatory Visit | Attending: Family Medicine | Admitting: Family Medicine

## 2020-11-09 DIAGNOSIS — M8580 Other specified disorders of bone density and structure, unspecified site: Secondary | ICD-10-CM

## 2020-11-09 DIAGNOSIS — M8589 Other specified disorders of bone density and structure, multiple sites: Secondary | ICD-10-CM | POA: Diagnosis not present

## 2020-11-09 DIAGNOSIS — Z780 Asymptomatic menopausal state: Secondary | ICD-10-CM | POA: Diagnosis not present

## 2020-11-17 ENCOUNTER — Encounter: Payer: Self-pay | Admitting: Physician Assistant

## 2020-11-17 ENCOUNTER — Ambulatory Visit: Payer: Medicare PPO | Admitting: Physician Assistant

## 2020-11-17 ENCOUNTER — Ambulatory Visit (INDEPENDENT_AMBULATORY_CARE_PROVIDER_SITE_OTHER): Payer: Medicare PPO

## 2020-11-17 ENCOUNTER — Other Ambulatory Visit: Payer: Self-pay

## 2020-11-17 VITALS — BP 148/76 | HR 70 | Ht 59.0 in | Wt 218.0 lb

## 2020-11-17 DIAGNOSIS — R42 Dizziness and giddiness: Secondary | ICD-10-CM

## 2020-11-17 DIAGNOSIS — I10 Essential (primary) hypertension: Secondary | ICD-10-CM

## 2020-11-17 DIAGNOSIS — Z5181 Encounter for therapeutic drug level monitoring: Secondary | ICD-10-CM

## 2020-11-17 DIAGNOSIS — I480 Paroxysmal atrial fibrillation: Secondary | ICD-10-CM

## 2020-11-17 LAB — POCT INR: INR: 2.3 (ref 2.0–3.0)

## 2020-11-17 NOTE — Patient Instructions (Signed)
Medication Instructions:  Continue current medications  *If you need a refill on your cardiac medications before your next appointment, please call your pharmacy*   Lab Work: None Ordered   Testing/Procedures: None Ordered   Follow-Up: At [facility], you and your health needs are our priority.  As part of our continuing mission to provide you with exceptional heart care, we have created designated Provider Care Teams.  These Care Teams include your primary Cardiologist (physician) and Advanced Practice Providers (APPs -  Physician Assistants and Nurse Practitioners) who all work together to provide you with the care you need, when you need it.  We recommend signing up for the patient portal called "MyChart".  Sign up information is provided on this After Visit Summary.  MyChart is used to connect with patients for Virtual Visits (Telemedicine).  Patients are able to view lab/test results, encounter notes, upcoming appointments, etc.  Non-urgent messages can be sent to your provider as well.   To learn more about what you can do with MyChart, go to [email or web address].    Your next appointment:   6 month(s)  The format for your next appointment:   In Person  Provider:   You may see Minus Breeding, MD or one of the following Advanced Practice Providers on your designated Care Team:   Rosaria Ferries, PA-C Caron Presume, PA-C Jory Sims, DNP, ANP

## 2020-11-17 NOTE — Patient Instructions (Signed)
Continue taking 5 mg daily. Repeat 6 weeks

## 2020-11-17 NOTE — Progress Notes (Signed)
Cardiology Office Note:    Date:  11/19/2020   ID:  [name], DOB 25-Feb-1946, MRN [id number]  PCP:  Kelton Pillar, MD   [place] Providers Cardiologist:  Minus Breeding, MD {  Referring MD: Kelton Pillar, MD   No chief complaint on file.   History of Present Illness:    [name] is a 75 y.o. female with a hx of hypertension, borderline hyperlipidemia, hypothyroidism and PAF on coumadin. She was in the ED in December 2017 for palpitation, EKG showed atrial fibrillation. She was not on systemic anticoagulation at the time. She eventually self converted to sinus rhythm on IV Lopressor. During the follow-up on 04/03/2016, she was agreeable to start on eliquis. However due to the inability to afford eliquis, her compliance was very limited. Therefore she was transitioned to Coumadin in February 2018.  Although she was initially hesitant to consider flecainide, she was eventually agreeable to be placed on flecainide therapy.  Last echocardiogram obtained on 04/18/2016 showed EF 60-65%, grade 2 DD. Myoview obtained on 05/22/2016 showed EF 62%, low risk study, no ischemia.  She returned to the ED in August 2018 with recurrent atrial fibrillation.  TSH and free T4 were normal.  During previous visit in April 2022, her beta-blocker was discontinued due to bradycardia.  Unfortunately she developed orthostatic dizziness despite discontinuation of her beta-blocker.  Dr. Percival Spanish recommended a thigh compression stocking.  During the last office visit in May, Dr. Percival Spanish recommended a 3-day heart monitor to assess for bradycardia.  No bradycardia was seen that can contribute to her dizziness.  Patient presents today for follow-up.  Blood pressure is borderline high, she still have occasional orthostatic dizziness when she suddenly change body positions.  She is learning to change body position slowly.  Blood pressure is borderline high today, I am hesitant to add any blood pressure  medication.  She denies any chest pain or worsening dyspnea.  Heart rate is very regular on physical exam, suggesting of sinus rhythm.  We will continue on flecainide and Coumadin therapy.  Otherwise since she is doing so well, she can follow-up in 6 months.    Past Medical History:  Diagnosis Date   Borderline hyperlipidemia    Borderline hypertension    Complication of anesthesia    PONV   Hypertension    Hypothyroid    s/p PTU therapy   PAF (paroxysmal atrial fibrillation) ([name])     Past Surgical History:  Procedure Laterality Date   BILIARY DILATION  07/14/2020   Procedure: BILIARY DILATION;  Surgeon: Milus Banister, MD;  Location: [facility] ENDOSCOPY;  Service: Endoscopy;;   CESAREAN SECTION     CHOLECYSTECTOMY     ENDOSCOPIC RETROGRADE CHOLANGIOPANCREATOGRAPHY (ERCP) WITH PROPOFOL N/A 07/14/2020   Procedure: ENDOSCOPIC RETROGRADE CHOLANGIOPANCREATOGRAPHY (ERCP) WITH PROPOFOL;  Surgeon: Milus Banister, MD;  Location: [facility] ENDOSCOPY;  Service: Endoscopy;  Laterality: N/A;   ESOPHAGOGASTRODUODENOSCOPY (EGD) WITH PROPOFOL N/A 07/14/2020   Procedure: ESOPHAGOGASTRODUODENOSCOPY (EGD) WITH PROPOFOL;  Surgeon: Milus Banister, MD;  Location: [facility] ENDOSCOPY;  Service: Endoscopy;  Laterality: N/A;   KNEE SURGERY     right 2014, left 2015, arthroscipic   LEG SURGERY     Left leg d/t  tumor (benign)   REMOVAL OF STONES  07/14/2020   Procedure: REMOVAL OF STONES;  Surgeon: Milus Banister, MD;  Location: [facility] ENDOSCOPY;  Service: Endoscopy;;   SPHINCTEROTOMY  07/14/2020   Procedure: [name];  Surgeon: Milus Banister, MD;  Location: [place];  Service: Endoscopy;;   THYROID SURGERY     UPPER ESOPHAGEAL ENDOSCOPIC ULTRASOUND (EUS) N/A 07/14/2020   Procedure: UPPER ESOPHAGEAL ENDOSCOPIC ULTRASOUND (EUS);  Surgeon: Milus Banister, MD;  Location[facility] ENDOSCOPY;  Service: Endoscopy;  Laterality: N/A;    Current Medications: Current Meds  Medication Sig   flecainide (TAMBOCOR) 100 MG tablet  Take 1 tablet (100 mg total) by mouth 2 (two) times daily.   omeprazole (PRILOSEC) 10 MG capsule Take 10 mg by mouth daily as needed (acid).    warfarin (COUMADIN) 5 MG tablet TAKE 1 to 2 TABLETS BY MOUTH ONCE DAILY OR AS  DIRECTED  BY  COUMADIN  CLINIC     Allergies:   Chocolate, Erythromycin, Other, Shellfish allergy, Naproxen sodium, Penicillins, and Promethazine hcl   Social History   Socioeconomic History   Marital status: Married    Spouse name: Not on file   Number of children: Not on file   Years of education: Not on file   Highest education level: Not on file  Occupational History   Not on file  Tobacco Use   Smoking status: Former    Packs/day: 0.80    Years: 5.00    Pack years: 4.00    Types: Cigarettes    Quit date: 04/08/1982    Years since quitting: 38.6   Smokeless tobacco: Never  Vaping Use   Vaping Use: Never used  Substance and Sexual Activity   Alcohol use: No   Drug use: No   Sexual activity: Not on file  Other Topics Concern   Not on file  Social History Narrative   Not on file   Social Determinants of Health   Financial Resource Strain: Not on file  Food Insecurity: Not on file  Transportation Needs: Not on file  Physical Activity: Not on file  Stress: Not on file  Social Connections: Not on file     Family History: The patient's family history includes Breast cancer in her mother; Breast cancer (age of onset: 49) in her sister; Cirrhosis in her brother; Diabetes in her brother; Lung cancer in her father. There is no history of Colon cancer or Colon polyps.  ROS:   Please see the history of present illness.     All other systems reviewed and are negative.  EKGs/Labs/Other Studies Reviewed:    The following studies were reviewed today:  Echo 04/18/2016 LV EF: 60% -   65%  Study Conclusions   - Left ventricle: The cavity size was normal. Wall thickness was    normal. Systolic function was normal. The estimated ejection    fraction was  in the range of 60% to 65%. Wall motion was normal;    there were no regional wall motion abnormalities. Features are    consistent with a pseudonormal left ventricular filling pattern,    with concomitant abnormal relaxation and increased filling    pressure (grade 2 diastolic dysfunction).    EKG:  EKG is not ordered today.    Recent Labs: 07/13/2020: Magnesium 1.9 07/14/2020: TSH 2.186 07/15/2020: BUN 18; Creatinine, Ser 1.36; Potassium 3.8; Sodium 134 08/24/2020: Hemoglobin 13.8; Platelets 227.0 09/27/2020: ALT 15  Recent Lipid Panel    Component Value Date/Time   CHOL  05/27/2008 0700    149        ATP III CLASSIFICATION:  <200     mg/dL   Desirable  200-239  mg/dL   Borderline High  >=240    mg/dL   High  TRIG 54 05/27/2008 0700   HDL 51 05/27/2008 0700   CHOLHDL 2.9 05/27/2008 0700   VLDL 11 05/27/2008 0700   LDLCALC  05/27/2008 0700    87        Total Cholesterol/HDL:CHD Risk Coronary Heart Disease Risk Table                     Men   Women  1/2 Average Risk   3.4   3.3  Average Risk       5.0   4.4  2 X Average Risk   9.6   7.1  3 X Average Risk  23.4   11.0        Use the calculated Patient Ratio above and the CHD Risk Table to determine the patient's CHD Risk.        ATP III CLASSIFICATION (LDL):  <100     mg/dL   Optimal  100-129  mg/dL   Near or Above                    Optimal  130-159  mg/dL   Borderline  160-189  mg/dL   High  >190     mg/dL   Very High     Risk Assessment/Calculations:    CHA2DS2-VASc Score = 3  This indicates a 3.2% annual risk of stroke. The patient's score is based upon: CHF History: No HTN History: Yes Diabetes History: No Stroke History: No Vascular Disease History: No Age Score: 1 Gender Score: 1          Physical Exam:    VS:  BP (!) 148/76   Pulse 70   Ht '4\' 11"'$  (1.499 m)   Wt 218 lb (98.9 kg)   SpO2 99%   BMI 44.03 kg/m     Wt Readings from Last 3 Encounters:  11/17/20 218 lb (98.9 kg)   08/31/20 211 lb (95.7 kg)  08/24/20 211 lb 3.2 oz (95.8 kg)     GEN:  Well nourished, well developed in no acute distress HEENT: Normal NECK: No JVD; No carotid bruits LYMPHATICS: No lymphadenopathy CARDIAC: RRR, no murmurs, rubs, gallops RESPIRATORY:  Clear to auscultation without rales, wheezing or rhonchi  ABDOMEN: Soft, non-tender, non-distended MUSCULOSKELETAL:  No edema; No deformity  SKIN: Warm and dry NEUROLOGIC:  Alert and oriented x 3 PSYCHIATRIC:  Normal affect   ASSESSMENT:    1. Orthostatic dizziness   2. Essential hypertension   3. PAF (paroxysmal atrial fibrillation) (HCC)    PLAN:    In order of problems listed above:  Orthostatic dizziness: Symptom has been stable.  We will keep systolic blood pressure higher than average.  Fall precaution discussed.  Keep adequate hydration  Hypertension: Blood pressure borderline elevated, however with significant orthostatic dizziness, we will try to keep blood pressure higher than normal  PAF: Continue flecainide and Coumadin.  Not on AV nodal blocking agent given significant orthostatic dizziness.        Medication Adjustments/Labs and Tests Ordered: Current medicines are reviewed at length with the patient today.  Concerns regarding medicines are outlined above.  No orders of the defined types were placed in this encounter.  No orders of the defined types were placed in this encounter.   Patient Instructions  Medication Instructions:  Continue current medications  *If you need a refill on your cardiac medications before your next appointment, please call your pharmacy*   Lab Work: None Ordered   Testing/Procedures: None Ordered  Follow-Up: At [facility], you and your health needs are our priority.  As part of our continuing mission to provide you with exceptional heart care, we have created designated Provider Care Teams.  These Care Teams include your primary Cardiologist (physician) and  Advanced Practice Providers (APPs -  Physician Assistants and Nurse Practitioners) who all work together to provide you with the care you need, when you need it.  We recommend signing up for the patient portal called "MyChart".  Sign up information is provided on this After Visit Summary.  MyChart is used to connect with patients for Virtual Visits (Telemedicine).  Patients are able to view lab/test results, encounter notes, upcoming appointments, etc.  Non-urgent messages can be sent to your provider as well.   To learn more about what you can do with MyChart, go to [email or web address].    Your next appointment:   6 month(s)  The format for your next appointment:   In Person  Provider:   You may see Minus Breeding, MD or one of the following Advanced Practice Providers on your designated Care Team:   Rosaria Ferries, PA-C Caron Presume, PA-C Jory Sims, DNP, ANP     Signed, Almyra Deforest, [place]  11/19/2020 10:41 PM    [place]

## 2020-11-28 DIAGNOSIS — H5203 Hypermetropia, bilateral: Secondary | ICD-10-CM | POA: Diagnosis not present

## 2020-12-12 DIAGNOSIS — H0015 Chalazion left lower eyelid: Secondary | ICD-10-CM | POA: Diagnosis not present

## 2020-12-12 DIAGNOSIS — H0014 Chalazion left upper eyelid: Secondary | ICD-10-CM | POA: Diagnosis not present

## 2020-12-29 ENCOUNTER — Ambulatory Visit (INDEPENDENT_AMBULATORY_CARE_PROVIDER_SITE_OTHER): Payer: Medicare PPO

## 2020-12-29 ENCOUNTER — Other Ambulatory Visit: Payer: Self-pay

## 2020-12-29 DIAGNOSIS — Z5181 Encounter for therapeutic drug level monitoring: Secondary | ICD-10-CM | POA: Diagnosis not present

## 2020-12-29 DIAGNOSIS — I480 Paroxysmal atrial fibrillation: Secondary | ICD-10-CM | POA: Diagnosis not present

## 2020-12-29 LAB — POCT INR: INR: 2.9 (ref 2.0–3.0)

## 2020-12-29 NOTE — Patient Instructions (Signed)
Continue taking 5 mg daily. Repeat 6 weeks [phone]

## 2021-02-08 ENCOUNTER — Other Ambulatory Visit: Payer: Self-pay

## 2021-02-08 ENCOUNTER — Ambulatory Visit (INDEPENDENT_AMBULATORY_CARE_PROVIDER_SITE_OTHER): Payer: Medicare PPO

## 2021-02-08 DIAGNOSIS — I480 Paroxysmal atrial fibrillation: Secondary | ICD-10-CM

## 2021-02-08 DIAGNOSIS — Z5181 Encounter for therapeutic drug level monitoring: Secondary | ICD-10-CM

## 2021-02-08 LAB — POCT INR: INR: 2.4 (ref 2.0–3.0)

## 2021-02-08 NOTE — Patient Instructions (Signed)
Continue taking 5 mg daily. Repeat 6 weeks [phone]

## 2021-02-26 ENCOUNTER — Emergency Department (HOSPITAL_COMMUNITY)
Admission: EM | Admit: 2021-02-26 | Discharge: 2021-02-26 | Disposition: A | Discharge status: Home / Self Care | Payer: Medicare PPO | Attending: Emergency Medicine | Admitting: Emergency Medicine

## 2021-02-26 ENCOUNTER — Emergency Department (HOSPITAL_COMMUNITY): Payer: Medicare PPO

## 2021-02-26 DIAGNOSIS — I480 Paroxysmal atrial fibrillation: Secondary | ICD-10-CM | POA: Insufficient documentation

## 2021-02-26 DIAGNOSIS — Z87891 Personal history of nicotine dependence: Secondary | ICD-10-CM | POA: Insufficient documentation

## 2021-02-26 DIAGNOSIS — N289 Disorder of kidney and ureter, unspecified: Secondary | ICD-10-CM | POA: Insufficient documentation

## 2021-02-26 DIAGNOSIS — E876 Hypokalemia: Secondary | ICD-10-CM | POA: Diagnosis not present

## 2021-02-26 DIAGNOSIS — Z7901 Long term (current) use of anticoagulants: Secondary | ICD-10-CM | POA: Diagnosis not present

## 2021-02-26 DIAGNOSIS — R0789 Other chest pain: Secondary | ICD-10-CM | POA: Insufficient documentation

## 2021-02-26 DIAGNOSIS — R1013 Epigastric pain: Secondary | ICD-10-CM | POA: Diagnosis not present

## 2021-02-26 DIAGNOSIS — R42 Dizziness and giddiness: Secondary | ICD-10-CM | POA: Diagnosis not present

## 2021-02-26 DIAGNOSIS — I959 Hypotension, unspecified: Secondary | ICD-10-CM | POA: Diagnosis not present

## 2021-02-26 DIAGNOSIS — E039 Hypothyroidism, unspecified: Secondary | ICD-10-CM | POA: Insufficient documentation

## 2021-02-26 DIAGNOSIS — I10 Essential (primary) hypertension: Secondary | ICD-10-CM | POA: Insufficient documentation

## 2021-02-26 DIAGNOSIS — R079 Chest pain, unspecified: Secondary | ICD-10-CM | POA: Diagnosis not present

## 2021-02-26 DIAGNOSIS — R0602 Shortness of breath: Secondary | ICD-10-CM | POA: Diagnosis not present

## 2021-02-26 LAB — PROTIME-INR
INR: 2.2 — ABNORMAL HIGH (ref 0.8–1.2)
Prothrombin Time: 24.6 seconds — ABNORMAL HIGH (ref 11.4–15.2)

## 2021-02-26 LAB — CBC WITH DIFFERENTIAL/PLATELET
Abs Immature Granulocytes: 0.07 10*3/uL (ref 0.00–0.07)
Basophils Absolute: 0.1 10*3/uL (ref 0.0–0.1)
Basophils Relative: 0 %
Eosinophils Absolute: 0 10*3/uL (ref 0.0–0.5)
Eosinophils Relative: 0 %
HCT: 40.9 % (ref 36.0–46.0)
Hemoglobin: 13.1 g/dL (ref 12.0–15.0)
Immature Granulocytes: 1 %
Lymphocytes Relative: 9 %
Lymphs Abs: 1.3 10*3/uL (ref 0.7–4.0)
MCH: 30 pg (ref 26.0–34.0)
MCHC: 32 g/dL (ref 30.0–36.0)
MCV: 93.6 fL (ref 80.0–100.0)
Monocytes Absolute: 1.1 10*3/uL — ABNORMAL HIGH (ref 0.1–1.0)
Monocytes Relative: 8 %
Neutro Abs: 11.5 10*3/uL — ABNORMAL HIGH (ref 1.7–7.7)
Neutrophils Relative %: 82 %
Platelets: 227 10*3/uL (ref 150–400)
RBC: 4.37 MIL/uL (ref 3.87–5.11)
RDW: 14.6 % (ref 11.5–15.5)
WBC: 14.1 10*3/uL — ABNORMAL HIGH (ref 4.0–10.5)
nRBC: 0 % (ref 0.0–0.2)

## 2021-02-26 LAB — TROPONIN I (HIGH SENSITIVITY)
Troponin I (High Sensitivity): 14 ng/L (ref <18)
Troponin I (High Sensitivity): 12 ng/L (ref <18)

## 2021-02-26 LAB — BASIC METABOLIC PANEL
Anion gap: 10 (ref 5–15)
BUN: 17 mg/dL (ref 8–23)
CO2: 27 mmol/L (ref 22–32)
Calcium: 8.6 mg/dL — ABNORMAL LOW (ref 8.9–10.3)
Chloride: 101 mmol/L (ref 98–111)
Creatinine, Ser: 1.1 mg/dL — ABNORMAL HIGH (ref 0.44–1.00)
GFR, Estimated: 53 mL/min — ABNORMAL LOW (ref >60)
Glucose, Bld: 122 mg/dL — ABNORMAL HIGH (ref 70–99)
Potassium: 3.3 mmol/L — ABNORMAL LOW (ref 3.5–5.1)
Sodium: 138 mmol/L (ref 135–145)

## 2021-02-26 MED ORDER — ASPIRIN 81 MG PO CHEW
324.0000 mg | CHEWABLE_TABLET | Freq: Once | ORAL | Status: AC
  Administered 2021-02-26: 324 mg via ORAL
  Filled 2021-02-26: qty 4

## 2021-02-26 MED ORDER — POTASSIUM CHLORIDE CRYS ER 20 MEQ PO TBCR: 20.0000 meq | EXTENDED_RELEASE_TABLET | Freq: Two times a day (BID) | ORAL | 0 refills | Status: DC

## 2021-02-26 MED ORDER — ALUM & MAG HYDROXIDE-SIMETH 200-200-20 MG/5ML PO SUSP
30.0000 mL | Freq: Once | ORAL | Status: AC
  Administered 2021-02-26: 30 mL via ORAL
  Filled 2021-02-26: qty 30

## 2021-02-26 MED ORDER — POTASSIUM CHLORIDE CRYS ER 20 MEQ PO TBCR
40.0000 meq | EXTENDED_RELEASE_TABLET | Freq: Once | ORAL | Status: AC
  Administered 2021-02-26: 40 meq via ORAL
  Filled 2021-02-26: qty 2

## 2021-02-26 MED ORDER — NITROGLYCERIN 0.4 MG SL SUBL
0.4000 mg | SUBLINGUAL_TABLET | SUBLINGUAL | Status: DC | PRN
  Administered 2021-02-26: 0.4 mg via SUBLINGUAL
  Filled 2021-02-26: qty 1

## 2021-02-26 NOTE — Discharge Instructions (Signed)
Your blood tests and ECG did not show any sign of any heart injury.  I suspect your pain is from acid reflux.  Please take your omeprazole (Prilosec) every day for the next 2 weeks.  You may take antacids as needed.  Return if you are having any new or concerning symptoms.  Please make a follow-up appointment with your cardiologist to see if he might want to do a stress test.

## 2021-02-26 NOTE — ED Provider Notes (Signed)
[facility] EMERGENCY DEPARTMENT Provider Note   CSN: [id number] Arrival date & time: 02/26/21  0032     History Chief Complaint  Patient presents with   Chest Pain    [name] is a 75 y.o. female.  The history is provided by the patient.  Chest Pain She has history of hypertension, atrial fibrillation anticoagulated on warfarin and comes in complaining of chest pain which started at about 8:30 PM.  Pain is over the lower sternal area with radiation to the back.  Onset was at rest.  She describes the pain as if somebody was pushing on her chest.  She did notice some difficulty breathing and some mild nausea as well as diaphoresis.  She has has had similar pain in the past, but not as severe.  In the past, similar pain was related to acid reflux.  She took a dose of omeprazole, which usually helps this pain, but it did not help tonight.  Pain is initially rated at 10/10, now down to 8/10.   Past Medical History:  Diagnosis Date   Borderline hyperlipidemia    Borderline hypertension    Complication of anesthesia    PONV   Hypertension    Hypothyroid    s/p PTU therapy   PAF (paroxysmal atrial fibrillation) [facility])     Patient Active Problem List   Diagnosis Date Noted   Abnormal pancreas function test    Choledocholithiasis    Abdominal pain 07/12/2020   Hypokalemia 07/12/2020   Hyponatremia 07/12/2020   Coagulopathy ([place]) 07/12/2020   Abdominal pain, epigastric 06/15/2020   Elevated LFTs 06/15/2020   Abnormal finding on imaging 06/15/2020   Educated about COVID-19 virus infection 05/04/2019   Allergic asthma 06/12/2010   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) 06/12/2010   Perennial allergic rhinitis with seasonal variation 05/04/2010   UNSPECIFIED TACHYCARDIA 12/19/2009   Obesity, unspecified 09/16/2008   Hypothyroidism 09/15/2008   DYSLIPIDEMIA 09/15/2008   Essential hypertension 09/15/2008   ATRIAL FIBRILLATION, PAROXYSMAL 09/15/2008     Past Surgical History:  Procedure Laterality Date   BILIARY DILATION  07/14/2020   Procedure: BILIARY DILATION;  Surgeon: Milus Banister, MD;  Location: [facility] ENDOSCOPY;  Service: Endoscopy;;   CESAREAN SECTION     CHOLECYSTECTOMY     ENDOSCOPIC RETROGRADE CHOLANGIOPANCREATOGRAPHY (ERCP) WITH PROPOFOL N/A 07/14/2020   Procedure: ENDOSCOPIC RETROGRADE CHOLANGIOPANCREATOGRAPHY (ERCP) WITH PROPOFOL;  Surgeon: Milus Banister, MD;  Location: [facility] ENDOSCOPY;  Service: Endoscopy;  Laterality: N/A;   ESOPHAGOGASTRODUODENOSCOPY (EGD) WITH PROPOFOL N/A 07/14/2020   Procedure: ESOPHAGOGASTRODUODENOSCOPY (EGD) WITH PROPOFOL;  Surgeon: Milus Banister, MD;  Location: [facility] ENDOSCOPY;  Service: Endoscopy;  Laterality: N/A;   KNEE SURGERY     right 2014, left 2015, arthroscipic   LEG SURGERY     Left leg d/t  tumor (benign)   REMOVAL OF STONES  07/14/2020   Procedure: REMOVAL OF STONES;  Surgeon: Milus Banister, MD;  Location: [facility] ENDOSCOPY;  Service: Endoscopy;;   SPHINCTEROTOMY  07/14/2020   Procedure: [name];  Surgeon: Milus Banister, MD;  Location: [place];  Service: Endoscopy;;   THYROID SURGERY     UPPER ESOPHAGEAL ENDOSCOPIC ULTRASOUND (EUS) N/A 07/14/2020   Procedure: UPPER ESOPHAGEAL ENDOSCOPIC ULTRASOUND (EUS);  Surgeon: Milus Banister, MD;  Location: [facility] ENDOSCOPY;  Service: Endoscopy;  Laterality: N/A;     OB History   No obstetric history on file.     Family History  Problem Relation Age of Onset   Lung  cancer Father    Breast cancer Mother        in 76's   Cirrhosis Brother    Diabetes Brother    Breast cancer Sister 30   Colon cancer Neg Hx    Colon polyps Neg Hx     Social History   Tobacco Use   Smoking status: Former    Packs/day: 0.80    Years: 5.00    Pack years: 4.00    Types: Cigarettes    Quit date: 04/08/1982    Years since quitting: 38.9   Smokeless tobacco: Never  Vaping Use   Vaping Use: Never used  Substance Use Topics   Alcohol use: No   Drug  use: No    Home Medications Prior to Admission medications   Medication Sig Start Date End Date Taking? Authorizing Provider  flecainide (TAMBOCOR) 100 MG tablet Take 1 tablet (100 mg total) by mouth 2 (two) times daily. 08/04/20   Minus Breeding, MD  omeprazole (PRILOSEC) 10 MG capsule Take 10 mg by mouth daily as needed (acid).     [provider]  warfarin (COUMADIN) 5 MG tablet TAKE 1 to 2 TABLETS BY MOUTH ONCE DAILY OR AS  DIRECTED  BY  COUMADIN  CLINIC 10/23/20   Minus Breeding, MD    Allergies    Chocolate, Erythromycin, Other, Shellfish allergy, Naproxen sodium, Penicillins, and Promethazine hcl  Review of Systems   Review of Systems  Cardiovascular:  Positive for chest pain.  All other systems reviewed and are negative.  Physical Exam Updated Vital Signs BP (!) 159/79   Pulse 61   Temp 98 F (36.7 C)   Resp 16   SpO2 100%   Physical Exam Vitals and nursing note reviewed.  75 year old female, resting comfortably and in no acute distress. Vital signs are significant for elevated blood pressure. Oxygen saturation is 100%, which is normal. Head is normocephalic and atraumatic. PERRLA, EOMI. Oropharynx is clear. Neck is nontender and supple without adenopathy or JVD. Back is nontender and there is no CVA tenderness. Lungs are clear without rales, wheezes, or rhonchi. Chest is moderately tender over the lower sternal area without crepitus. Heart has regular rate and rhythm without murmur. Abdomen is soft, flat, with moderate tenderness in the epigastric area.  There is no rebound or guarding. Extremities have no cyanosis or edema, full range of motion is present. Skin is warm and dry without rash. Neurologic: Mental status is normal, cranial nerves are intact, moves all extremities equally.  ED Results / Procedures / Treatments   Labs (all labs ordered are listed, but only abnormal results are displayed) Labs Reviewed  BASIC METABOLIC PANEL - Abnormal;  Notable for the following components:      Result Value   Potassium 3.3 (*)    Glucose, Bld 122 (*)    Creatinine, Ser 1.10 (*)    Calcium 8.6 (*)    GFR, Estimated 53 (*)    All other components within normal limits  CBC WITH DIFFERENTIAL/PLATELET - Abnormal; Notable for the following components:   WBC 14.1 (*)    Neutro Abs 11.5 (*)    Monocytes Absolute 1.1 (*)    All other components within normal limits  PROTIME-INR - Abnormal; Notable for the following components:   Prothrombin Time 24.6 (*)    INR 2.2 (*)    All other components within normal limits  TROPONIN I (HIGH SENSITIVITY)  TROPONIN I (HIGH SENSITIVITY)    EKG EKG Interpretation  Date/Time:  Monday February 26 2021 00:51:53 EST Ventricular Rate:  63 PR Interval:  53 QRS Duration: 95 QT Interval:  472 QTC Calculation: 484 R Axis:   21 Text Interpretation: Sinus rhythm Short PR interval Anterior infarct, old Borderline ST depression, lateral leads Minimal ST elevation, inferior leads When compared with ECG of 07/14/2020, No significant change was found Confirmed by Delora Fuel ([id number]) on 02/26/2021 1:12:18 AM  Radiology DG Chest Port 1 View  Result Date: 02/26/2021 CLINICAL DATA:  Chest pain and shortness of breath EXAM: PORTABLE CHEST 1 VIEW COMPARISON:  06/21/2018 FINDINGS: Cardiac shadow is enlarged but accentuated by the portable technique. Lungs are clear. No bony abnormality is noted. IMPRESSION: No active disease. Electronically Signed   By: Inez Catalina M.D.   On: 02/26/2021 01:43    Procedures Procedures   Medications Ordered in ED Medications  nitroGLYCERIN (NITROSTAT) SL tablet 0.4 mg (0.4 mg Sublingual Given 02/26/21 0208)  aspirin chewable tablet 324 mg (324 mg Oral Given 02/26/21 0208)  alum & mag hydroxide-simeth (MAALOX/MYLANTA) 200-200-20 MG/5ML suspension 30 mL (30 mLs Oral Given 02/26/21 0321)    ED Course  I have reviewed the triage vital signs and the nursing notes.  Pertinent labs &  imaging results that were available during my care of the patient were reviewed by me and considered in my medical decision making (see chart for details).   MDM Rules/Calculators/A&P                         Chest pain which seems most likely to be GI in origin, but need to rule out cardiac etiology.  ECG shows no change from prior.  Review of old records shows that she has been evaluated by cardiology for paroxysmal atrial fibrillation.  Myoview study in 2018 was normal, exercise tolerance test in 2021 was nondiagnostic because of failure to achieve maximal heart rate.  Upper endoscopy and endoscopic ultrasound on 07/14/2020 showed normal stomach and esophagus, presence of duodenal diverticulum, presence of common bile duct stone and sludge treated with endoscopic sphincterotomy.  Last INR was therapeutic at 2.4 on 02/08/2021.  She will be given aspirin and nitroglycerin and will check troponin levels.  She had no relief with nitroglycerin.  She is given a dose of an antacid with good relief of symptoms.  Laboratory work-up shows stable, mild, renal insufficiency as well as mild hypokalemia.  She is given a dose of oral potassium.  Troponin is normal x2.  Chest x-ray shows no active disease.  She is felt to be safe for discharge.  She currently takes omeprazole on an as-needed basis and states he averages taking it about every other day.  She is advised to take omeprazole daily for the next 2 weeks, follow-up with PCP.  Also, recommended follow-up with cardiologist to consider stress test as an outpatient.  Return precautions discussed.  Prescription for short course of K-Dur given.  Final Clinical Impression(s) / ED Diagnoses Final diagnoses:  Atypical chest pain  Hypokalemia  Renal insufficiency  Anticoagulated on warfarin    Rx / DC Orders ED Discharge Orders          Ordered    potassium chloride SA (KLOR-CON) 20 MEQ tablet  2 times daily        02/26/21 8144             Delora Fuel,  MD 81/85/63 [id number]

## 2021-02-26 NOTE — ED Triage Notes (Signed)
EMS arrival. Sxs onset 02/26/21 2000. Describes a tightness center chest. SOB, which has dissipated since on arrival. Rates pain an 8. AxO4. On room air at %100.

## 2021-03-13 ENCOUNTER — Emergency Department (HOSPITAL_COMMUNITY): Payer: Medicare PPO

## 2021-03-13 ENCOUNTER — Inpatient Hospital Stay (HOSPITAL_COMMUNITY)
Admission: EM | Admit: 2021-03-13 | Discharge: 2021-03-16 | DRG: 445 | Disposition: A | Discharge status: Home / Self Care | Payer: Medicare PPO | Attending: Internal Medicine | Admitting: Internal Medicine

## 2021-03-13 ENCOUNTER — Encounter (HOSPITAL_COMMUNITY): Payer: Self-pay | Admitting: Internal Medicine

## 2021-03-13 DIAGNOSIS — Z7901 Long term (current) use of anticoagulants: Secondary | ICD-10-CM

## 2021-03-13 DIAGNOSIS — I10 Essential (primary) hypertension: Secondary | ICD-10-CM

## 2021-03-13 DIAGNOSIS — Z91014 Allergy to mammalian meats: Secondary | ICD-10-CM

## 2021-03-13 DIAGNOSIS — E039 Hypothyroidism, unspecified: Secondary | ICD-10-CM | POA: Diagnosis present

## 2021-03-13 DIAGNOSIS — Z20822 Contact with and (suspected) exposure to covid-19: Secondary | ICD-10-CM | POA: Diagnosis not present

## 2021-03-13 DIAGNOSIS — R9431 Abnormal electrocardiogram [ECG] [EKG]: Secondary | ICD-10-CM | POA: Diagnosis not present

## 2021-03-13 DIAGNOSIS — K8051 Calculus of bile duct without cholangitis or cholecystitis with obstruction: Secondary | ICD-10-CM | POA: Diagnosis not present

## 2021-03-13 DIAGNOSIS — I700 Atherosclerosis of aorta: Secondary | ICD-10-CM | POA: Diagnosis not present

## 2021-03-13 DIAGNOSIS — I110 Hypertensive heart disease with heart failure: Secondary | ICD-10-CM | POA: Diagnosis present

## 2021-03-13 DIAGNOSIS — K8050 Calculus of bile duct without cholangitis or cholecystitis without obstruction: Secondary | ICD-10-CM | POA: Diagnosis present

## 2021-03-13 DIAGNOSIS — Z91018 Allergy to other foods: Secondary | ICD-10-CM | POA: Diagnosis not present

## 2021-03-13 DIAGNOSIS — Z881 Allergy status to other antibiotic agents status: Secondary | ICD-10-CM | POA: Diagnosis not present

## 2021-03-13 DIAGNOSIS — Z91048 Other nonmedicinal substance allergy status: Secondary | ICD-10-CM

## 2021-03-13 DIAGNOSIS — Z91013 Allergy to seafood: Secondary | ICD-10-CM

## 2021-03-13 DIAGNOSIS — N281 Cyst of kidney, acquired: Secondary | ICD-10-CM | POA: Diagnosis not present

## 2021-03-13 DIAGNOSIS — R112 Nausea with vomiting, unspecified: Secondary | ICD-10-CM | POA: Diagnosis not present

## 2021-03-13 DIAGNOSIS — Z888 Allergy status to other drugs, medicaments and biological substances status: Secondary | ICD-10-CM

## 2021-03-13 DIAGNOSIS — Z880 Allergy status to penicillin: Secondary | ICD-10-CM | POA: Diagnosis not present

## 2021-03-13 DIAGNOSIS — Z886 Allergy status to analgesic agent status: Secondary | ICD-10-CM | POA: Diagnosis not present

## 2021-03-13 DIAGNOSIS — E669 Obesity, unspecified: Secondary | ICD-10-CM | POA: Diagnosis not present

## 2021-03-13 DIAGNOSIS — R17 Unspecified jaundice: Secondary | ICD-10-CM | POA: Diagnosis not present

## 2021-03-13 DIAGNOSIS — R935 Abnormal findings on diagnostic imaging of other abdominal regions, including retroperitoneum: Secondary | ICD-10-CM | POA: Diagnosis not present

## 2021-03-13 DIAGNOSIS — R1011 Right upper quadrant pain: Secondary | ICD-10-CM | POA: Diagnosis not present

## 2021-03-13 DIAGNOSIS — E782 Mixed hyperlipidemia: Secondary | ICD-10-CM | POA: Diagnosis not present

## 2021-03-13 DIAGNOSIS — R42 Dizziness and giddiness: Secondary | ICD-10-CM | POA: Diagnosis not present

## 2021-03-13 DIAGNOSIS — R1010 Upper abdominal pain, unspecified: Secondary | ICD-10-CM | POA: Diagnosis not present

## 2021-03-13 DIAGNOSIS — I480 Paroxysmal atrial fibrillation: Secondary | ICD-10-CM | POA: Diagnosis present

## 2021-03-13 DIAGNOSIS — R932 Abnormal findings on diagnostic imaging of liver and biliary tract: Secondary | ICD-10-CM | POA: Diagnosis not present

## 2021-03-13 DIAGNOSIS — N200 Calculus of kidney: Secondary | ICD-10-CM | POA: Diagnosis not present

## 2021-03-13 DIAGNOSIS — R079 Chest pain, unspecified: Secondary | ICD-10-CM | POA: Diagnosis not present

## 2021-03-13 DIAGNOSIS — Z87891 Personal history of nicotine dependence: Secondary | ICD-10-CM | POA: Diagnosis not present

## 2021-03-13 DIAGNOSIS — R0789 Other chest pain: Secondary | ICD-10-CM | POA: Diagnosis not present

## 2021-03-13 DIAGNOSIS — I959 Hypotension, unspecified: Secondary | ICD-10-CM | POA: Diagnosis not present

## 2021-03-13 DIAGNOSIS — Z9889 Other specified postprocedural states: Secondary | ICD-10-CM | POA: Diagnosis not present

## 2021-03-13 DIAGNOSIS — I5032 Chronic diastolic (congestive) heart failure: Secondary | ICD-10-CM | POA: Diagnosis not present

## 2021-03-13 DIAGNOSIS — Z79899 Other long term (current) drug therapy: Secondary | ICD-10-CM

## 2021-03-13 DIAGNOSIS — R933 Abnormal findings on diagnostic imaging of other parts of digestive tract: Secondary | ICD-10-CM

## 2021-03-13 DIAGNOSIS — Z419 Encounter for procedure for purposes other than remedying health state, unspecified: Secondary | ICD-10-CM

## 2021-03-13 DIAGNOSIS — K838 Other specified diseases of biliary tract: Secondary | ICD-10-CM | POA: Diagnosis not present

## 2021-03-13 HISTORY — DX: Failed or difficult intubation, initial encounter: T88.4XXA

## 2021-03-13 LAB — CBC WITH DIFFERENTIAL/PLATELET
Abs Immature Granulocytes: 0.07 10*3/uL (ref 0.00–0.07)
Basophils Absolute: 0 10*3/uL (ref 0.0–0.1)
Basophils Relative: 0 %
Eosinophils Absolute: 0 10*3/uL (ref 0.0–0.5)
Eosinophils Relative: 0 %
HCT: 37.7 % (ref 36.0–46.0)
Hemoglobin: 12.2 g/dL (ref 12.0–15.0)
Immature Granulocytes: 1 %
Lymphocytes Relative: 7 %
Lymphs Abs: 1 10*3/uL (ref 0.7–4.0)
MCH: 29.8 pg (ref 26.0–34.0)
MCHC: 32.4 g/dL (ref 30.0–36.0)
MCV: 92 fL (ref 80.0–100.0)
Monocytes Absolute: 1.1 10*3/uL — ABNORMAL HIGH (ref 0.1–1.0)
Monocytes Relative: 8 %
Neutro Abs: 10.9 10*3/uL — ABNORMAL HIGH (ref 1.7–7.7)
Neutrophils Relative %: 84 %
Platelets: 222 10*3/uL (ref 150–400)
RBC: 4.1 MIL/uL (ref 3.87–5.11)
RDW: 15.2 % (ref 11.5–15.5)
WBC: 13.1 10*3/uL — ABNORMAL HIGH (ref 4.0–10.5)
nRBC: 0 % (ref 0.0–0.2)

## 2021-03-13 LAB — COMPREHENSIVE METABOLIC PANEL
ALT: 122 U/L — ABNORMAL HIGH (ref 0–44)
AST: 205 U/L — ABNORMAL HIGH (ref 15–41)
Albumin: 2.8 g/dL — ABNORMAL LOW (ref 3.5–5.0)
Alkaline Phosphatase: 642 U/L — ABNORMAL HIGH (ref 38–126)
Anion gap: 10 (ref 5–15)
BUN: 26 mg/dL — ABNORMAL HIGH (ref 8–23)
CO2: 25 mmol/L (ref 22–32)
Calcium: 8.6 mg/dL — ABNORMAL LOW (ref 8.9–10.3)
Chloride: 100 mmol/L (ref 98–111)
Creatinine, Ser: 1.28 mg/dL — ABNORMAL HIGH (ref 0.44–1.00)
GFR, Estimated: 44 mL/min — ABNORMAL LOW (ref >60)
Glucose, Bld: 124 mg/dL — ABNORMAL HIGH (ref 70–99)
Potassium: 4.3 mmol/L (ref 3.5–5.1)
Sodium: 135 mmol/L (ref 135–145)
Total Bilirubin: 5.8 mg/dL — ABNORMAL HIGH (ref 0.3–1.2)
Total Protein: 7.2 g/dL (ref 6.5–8.1)

## 2021-03-13 LAB — RESP PANEL BY RT-PCR (FLU A&B, COVID) ARPGX2
Influenza A by PCR: NEGATIVE
Influenza B by PCR: NEGATIVE
SARS Coronavirus 2 by RT PCR: NEGATIVE

## 2021-03-13 LAB — PROTIME-INR
INR: 2.1 — ABNORMAL HIGH (ref 0.8–1.2)
Prothrombin Time: 23.6 seconds — ABNORMAL HIGH (ref 11.4–15.2)

## 2021-03-13 LAB — TROPONIN I (HIGH SENSITIVITY)
Troponin I (High Sensitivity): 8 ng/L (ref <18)
Troponin I (High Sensitivity): 9 ng/L (ref <18)

## 2021-03-13 LAB — LIPASE, BLOOD: Lipase: 30 U/L (ref 11–51)

## 2021-03-13 MED ORDER — FLECAINIDE ACETATE 100 MG PO TABS
100.0000 mg | ORAL_TABLET | Freq: Two times a day (BID) | ORAL | Status: DC
  Administered 2021-03-14: 100 mg via ORAL
  Filled 2021-03-13 (×3): qty 1

## 2021-03-13 MED ORDER — MORPHINE SULFATE (PF) 4 MG/ML IV SOLN
4.0000 mg | Freq: Once | INTRAVENOUS | Status: AC
  Administered 2021-03-13: 4 mg via INTRAVENOUS
  Filled 2021-03-13: qty 1

## 2021-03-13 MED ORDER — FAMOTIDINE IN NACL 20-0.9 MG/50ML-% IV SOLN
20.0000 mg | Freq: Once | INTRAVENOUS | Status: AC
  Administered 2021-03-13: 20 mg via INTRAVENOUS
  Filled 2021-03-13: qty 50

## 2021-03-13 MED ORDER — SODIUM CHLORIDE 0.9 % IV SOLN
2.0000 g | Freq: Two times a day (BID) | INTRAVENOUS | Status: DC
  Administered 2021-03-14: 2 g via INTRAVENOUS
  Filled 2021-03-13: qty 2

## 2021-03-13 MED ORDER — IOHEXOL 300 MG/ML  SOLN
80.0000 mL | Freq: Once | INTRAMUSCULAR | Status: AC | PRN
  Administered 2021-03-13: 80 mL via INTRAVENOUS

## 2021-03-13 MED ORDER — SODIUM CHLORIDE 0.9 % IV BOLUS
1000.0000 mL | Freq: Once | INTRAVENOUS | Status: AC
  Administered 2021-03-13: 1000 mL via INTRAVENOUS

## 2021-03-13 MED ORDER — MORPHINE SULFATE (PF) 4 MG/ML IV SOLN: 4.0000 mg | Freq: Once | INTRAVENOUS | Status: DC

## 2021-03-13 MED ORDER — METRONIDAZOLE 500 MG/100ML IV SOLN
500.0000 mg | Freq: Two times a day (BID) | INTRAVENOUS | Status: DC
  Administered 2021-03-14: 500 mg via INTRAVENOUS
  Filled 2021-03-13: qty 100

## 2021-03-13 MED ORDER — ENOXAPARIN SODIUM 40 MG/0.4ML IJ SOSY: 40.0000 mg | PREFILLED_SYRINGE | INTRAMUSCULAR | Status: DC

## 2021-03-13 MED ORDER — ACETAMINOPHEN 650 MG RE SUPP: 650.0000 mg | Freq: Four times a day (QID) | RECTAL | Status: DC | PRN

## 2021-03-13 MED ORDER — ACETAMINOPHEN 325 MG PO TABS: 650.0000 mg | ORAL_TABLET | Freq: Four times a day (QID) | ORAL | Status: DC | PRN

## 2021-03-13 MED ORDER — POLYETHYLENE GLYCOL 3350 17 G PO PACK: 17.0000 g | PACK | Freq: Every day | ORAL | Status: DC | PRN

## 2021-03-13 MED ORDER — LACTATED RINGERS IV SOLN: INTRAVENOUS | Status: DC

## 2021-03-13 MED ORDER — PANTOPRAZOLE SODIUM 40 MG IV SOLR
40.0000 mg | INTRAVENOUS | Status: DC
  Administered 2021-03-14 – 2021-03-15 (×2): 40 mg via INTRAVENOUS
  Filled 2021-03-13 (×2): qty 40

## 2021-03-13 NOTE — H&P (Signed)
History and Physical    [name] WFU:[id number] DOB: [birth year] DOA: 03/13/2021  PCP: Kelton Pillar, MD  Patient coming from: Home via EMS   Chief Complaint: Abdominal Pain, N/V   HPI:    75 year old female with past medical history of paroxysmal atrial fibrillation (on coumadin and flecanide),  diastolic CHF (Echo 05/252 EF 60-65% with G2DD), 11/hypothyroidism, hypertension, hyperlipidemia obesity and previous episode of choledocholithiasis 07/2020 who presents to [facility] emergency department via EMS with complaints of epigastric pain nausea and vomiting.  Patient explains that she woke up feeling well this morning.  She proceeded to eat some peppers and eggs with gravy on top at approximately 11 AM for breakfast.  Within the next hour the patient began to experience abdominal pain, epigastric in location associated with chest discomfort and heartburn.  Patient attempted to take a Prilosec with no improvement in symptoms.  Symptoms continue to worsen over the next several hours and became associated with severe nausea and several bouts of nonbilious nonbloody vomiting.  This is additionally associated with intense nausea and several bouts of vomiting.  Patient is complaining of associated generalized weakness and lightheadedness.    Due to patient's severe unrelenting symptoms over the next several hours EMS was eventually contacted who promptly came to evaluate the patient and brought her to [facility] emergency department for evaluation.  Upon evaluation in the emergency department initial laboratory work-up revealed an AST of 205, ALT of 122 and alkaline phosphatase of 642 with markedly elevated total bilirubin of 5.8.  Patient was additionally identified to have a leukocytosis of 13.1.  CT imaging of the abdomen and pelvis with contrast revealed pneumobilia but no obvious evidence of a common bile duct stone.  No other acute disease was noted.  Due to continued  clinical concern for hepatobiliary disease ER provider called and spoke to Dr. Silverio Decamp with gastroenterology who recommended obtaining an MRCP and initiation of empiric intravenous antibiotics in case of cholangitis with gastroenterology to formally consult on the patient in the morning.  Patient was additionally administered intravenous morphine Pepcid and 1 L of normal saline.  The hospitalist group was then called to assess the patient for admission to the hospital.  Review of Systems:   Review of Systems  Constitutional:  Positive for malaise/fatigue.  Gastrointestinal:  Positive for abdominal pain, nausea and vomiting.  Neurological:  Positive for dizziness and weakness.  All other systems reviewed and are negative.  Past Medical History:  Diagnosis Date   Borderline hyperlipidemia    Borderline hypertension    Complication of anesthesia    PONV   Difficult intubation    Hypertension    Hypothyroid    s/p PTU therapy   Hypothyroidism 09/15/2008   Qualifier: Diagnosis of  By: Percival Spanish, MD, Farrel Gordon     PAF (paroxysmal atrial fibrillation) [facility])     Past Surgical History:  Procedure Laterality Date   BILIARY DILATION  07/14/2020   Procedure: BILIARY DILATION;  Surgeon: Milus Banister, MD;  Location: [facility] ENDOSCOPY;  Service: Endoscopy;;   CESAREAN SECTION     CHOLECYSTECTOMY     ENDOSCOPIC RETROGRADE CHOLANGIOPANCREATOGRAPHY (ERCP) WITH PROPOFOL N/A 07/14/2020   Procedure: ENDOSCOPIC RETROGRADE CHOLANGIOPANCREATOGRAPHY (ERCP) WITH PROPOFOL;  Surgeon: Milus Banister, MD;  Location: [facility] ENDOSCOPY;  Service: Endoscopy;  Laterality: N/A;   ESOPHAGOGASTRODUODENOSCOPY (EGD) WITH PROPOFOL N/A 07/14/2020   Procedure: ESOPHAGOGASTRODUODENOSCOPY (EGD) WITH PROPOFOL;  Surgeon: Milus Banister, MD;  Location: [facility] ENDOSCOPY;  Service: Endoscopy;  Laterality: N/A;   KNEE SURGERY     right 2014, left 2015, arthroscipic   LEG SURGERY     Left leg d/t  tumor (benign)   REMOVAL OF STONES   07/14/2020   Procedure: REMOVAL OF STONES;  Surgeon: Milus Banister, MD;  Location: [facility] ENDOSCOPY;  Service: Endoscopy;;   SPHINCTEROTOMY  07/14/2020   Procedure: [name];  Surgeon: Milus Banister, MD;  Location: [facility];  Service: Endoscopy;;   THYROID SURGERY     UPPER ESOPHAGEAL ENDOSCOPIC ULTRASOUND (EUS) N/A 07/14/2020   Procedure: UPPER ESOPHAGEAL ENDOSCOPIC ULTRASOUND (EUS);  Surgeon: Milus Banister, MD;  Location: [facility] ENDOSCOPY;  Service: Endoscopy;  Laterality: N/A;     reports that she quit smoking about 38 years ago. Her smoking use included cigarettes. She has a 4.00 pack-year smoking history. She has never used smokeless tobacco. She reports that she does not drink alcohol and does not use drugs.  Allergies  Allergen Reactions   Chocolate Itching    Itching mouth   Beef-Derived Products Itching   Cat Hair Extract Itching   Erythromycin     Other reaction(s): Unknown   Fish Allergy Itching   Other Itching    white potatoes, corn   Phenergan [Promethazine] Other (See Comments)    Pt and family states her nausea worsens with phenergan rather than improving to the point she states she cant have it.    Pork-Derived Products Itching   Shellfish Allergy    Soybean Extract Allergy Skin Test Itching   Strawberry Extract Itching   Naproxen Sodium Other (See Comments)    Hurt all over   Penicillins Rash    Has patient had a PCN reaction causing immediate rash, facial/tongue/throat swelling, SOB or lightheadedness with hypotension: Yes Has patient had a PCN reaction causing severe rash involving mucus membranes or skin necrosis: Yes Has patient had a PCN reaction that required hospitalization: No Has patient had a PCN reaction occurring within the last 10 years: No If all of the above answers are "NO", then may proceed with Cephalosporin use.    Promethazine Hcl Other (See Comments)    Sick     Family History  Problem Relation Age of Onset   Lung cancer Father     Breast cancer Mother        in 74's   Cirrhosis Brother    Diabetes Brother    Breast cancer Sister 48   Colon cancer Neg Hx    Colon polyps Neg Hx      Prior to Admission medications   Medication Sig Start Date End Date Taking? Authorizing Provider  flecainide (TAMBOCOR) 100 MG tablet Take 1 tablet (100 mg total) by mouth 2 (two) times daily. 08/04/20   Minus Breeding, MD  omeprazole (PRILOSEC) 10 MG capsule Take 10 mg by mouth daily as needed (acid).     [provider]  potassium chloride SA (KLOR-CON) 20 MEQ tablet Take 1 tablet (20 mEq total) by mouth 2 (two) times daily. 44/96/75   Delora Fuel, MD  warfarin (COUMADIN) 5 MG tablet TAKE 1 to 2 TABLETS BY MOUTH ONCE DAILY OR AS  DIRECTED  BY  COUMADIN  CLINIC 10/23/20   Minus Breeding, MD    Physical Exam: Vitals:   03/14/21 0415 03/14/21 0500 03/14/21 0730 03/14/21 0800  BP: (!) 134/55 (!) 140/58 (!) 131/51 (!) 156/57  Pulse: 63 61 (!) 55 (!) 54  Resp: 16 19 19 13   Temp:  SpO2: 96% 94% 95% 96%  Weight:      Height:        Constitutional: Awake alert and oriented x3, no associated distress.   Skin: no rashes, no lesions, good skin turgor noted. Eyes: Pupils are equally reactive to light.  No evidence of scleral icterus or conjunctival pallor.  ENMT: Moist mucous membranes noted.  Posterior pharynx clear of any exudate or lesions.   Neck: normal, supple, no masses, no thyromegaly.  No evidence of jugular venous distension.   Respiratory: clear to auscultation bilaterally, no wheezing, no crackles. Normal respiratory effort. No accessory muscle use.  Cardiovascular: Regular rate and rhythm, no murmurs / rubs / gallops. No extremity edema. 2+ pedal pulses. No carotid bruits.  Chest:   Nontender without crepitus or deformity.   Back:   Nontender without crepitus or deformity. Abdomen: Generalized abdominal tenderness.  Abdomen is soft.  No evidence of intra-abdominal masses.  Positive bowel sounds noted in all  quadrants.   Musculoskeletal: No joint deformity upper and lower extremities. Good ROM, no contractures. Normal muscle tone.  Neurologic: CN 2-12 grossly intact. Sensation intact.  Patient moving all 4 extremities spontaneously.  Patient is following all commands.  Patient is responsive to verbal stimuli.   Psychiatric: Patient exhibits normal mood with appropriate affect.  Patient seems to possess insight as to their current situation.     Labs on Admission: I have personally reviewed following labs and imaging studies -   CBC: Recent Labs  Lab 03/13/21 1834 03/14/21 0419  WBC 13.1* 21.2*  NEUTROABS 10.9* 18.3*  HGB 12.2 11.9*  HCT 37.7 35.3*  MCV 92.0 89.8  PLT 222 914   Basic Metabolic Panel: Recent Labs  Lab 03/13/21 1834 03/14/21 0000 03/14/21 0419  NA 135  --  133*  K 4.3  --  4.3  CL 100  --  101  CO2 25  --  23  GLUCOSE 124*  --  105*  BUN 26*  --  23  CREATININE 1.28*  --  1.15*  CALCIUM 8.6*  --  7.9*  MG  --  1.8 1.8   GFR: Estimated Creatinine Clearance: 42.6 mL/min (A) (by C-G formula based on SCr of 1.15 mg/dL (H)). Liver Function Tests: Recent Labs  Lab 03/13/21 1834 03/14/21 0419  AST 205* 185*  ALT 122* 115*  ALKPHOS 642* 611*  BILITOT 5.8* 5.8*  PROT 7.2 6.2*  ALBUMIN 2.8* 2.4*   Recent Labs  Lab 03/13/21 1834  LIPASE 30   No results for input(s): AMMONIA in the last 168 hours. Coagulation Profile: Recent Labs  Lab 03/13/21 1834 03/14/21 0419  INR 2.1* 2.4*   Cardiac Enzymes: No results for input(s): CKTOTAL, CKMB, CKMBINDEX, TROPONINI in the last 168 hours. BNP (last 3 results) No results for input(s): PROBNP in the last 8760 hours. HbA1C: No results for input(s): HGBA1C in the last 72 hours. CBG: No results for input(s): GLUCAP in the last 168 hours. Lipid Profile: No results for input(s): CHOL, HDL, LDLCALC, TRIG, CHOLHDL, LDLDIRECT in the last 72 hours. Thyroid Function Tests: No results for input(s): TSH, T4TOTAL,  FREET4, T3FREE, THYROIDAB in the last 72 hours. Anemia Panel: No results for input(s): VITAMINB12, FOLATE, FERRITIN, TIBC, IRON, RETICCTPCT in the last 72 hours. Urine analysis:    Component Value Date/Time   COLORURINE YELLOW 06/21/2018 1255   APPEARANCEUR HAZY (A) 06/21/2018 1255   LABSPEC 1.006 06/21/2018 1255   PHURINE 6.0 06/21/2018 1255   GLUCOSEU NEGATIVE 06/21/2018 1255  HGBUR SMALL (A) 06/21/2018 1255   [place] 06/21/2018 [place] 06/21/2018 1255   PROTEINUR 30 (A) 06/21/2018 1255   NITRITE NEGATIVE 06/21/2018 1255   LEUKOCYTESUR NEGATIVE 06/21/2018 1255    Radiological Exams on Admission - Personally Reviewed: CT ABDOMEN PELVIS W CONTRAST  Result Date: 03/13/2021 CLINICAL DATA:  Abdominal distension. EXAM: CT ABDOMEN AND PELVIS WITH CONTRAST TECHNIQUE: Multidetector CT imaging of the abdomen and pelvis was performed using the standard protocol following bolus administration of intravenous contrast. CONTRAST:  26mL OMNIPAQUE IOHEXOL 300 MG/ML  SOLN COMPARISON:  CT abdomen and pelvis 12/10/2005. FINDINGS: Lower chest: No acute abnormality. Hepatobiliary: Status post cholecystectomy. There is no biliary ductal dilatation. Pneumobilia has increased when compared to the prior study. There is no focal liver lesion identified. Pancreas: Fatty replaced, unchanged. Spleen: Normal in size without focal abnormality. Adrenals/Urinary Tract: Left renal cysts are present measuring up to 2.6 cm. These are new from the prior examination. There is a nonobstructing 5 mm left renal calculus. There is no hydronephrosis or perinephric stranding. Adrenal glands and bladder are within normal limits. Stomach/Bowel: Stomach is within normal limits. no evidence of bowel wall thickening, distention, or inflammatory changes. There is sigmoid colon diverticulosis without evidence for diverticulitis. The appendix is not seen. Vascular/Lymphatic: Aortic atherosclerosis. No enlarged  abdominal or pelvic lymph nodes. Reproductive: Status post hysterectomy. No adnexal masses. Other: No abdominal wall hernia or abnormality. No abdominopelvic ascites. Musculoskeletal: No acute or significant osseous findings. IMPRESSION: 1.  No acute localizing process in the abdomen or pelvis. 2. Sigmoid colon diverticulosis without evidence for diverticulitis. 3. Cholecystectomy. Pneumobilia is present likely related to prior sphincterotomy. Please correlate clinically. 4.  Aortic Atherosclerosis (ICD10-I70.0). 5.  Nonobstructing left renal calculus.  New left renal cysts. Electronically Signed   By: Ronney Asters M.D.   On: 03/13/2021 21:28   DG Chest Port 1 View  Result Date: 03/13/2021 CLINICAL DATA:  Nausea and vomiting EXAM: PORTABLE CHEST 1 VIEW COMPARISON:  02/26/2021 FINDINGS: The heart size and mediastinal contours are within normal limits. Both lungs are clear. The visualized skeletal structures are unremarkable. IMPRESSION: No active disease. Electronically Signed   By: Inez Catalina M.D.   On: 03/13/2021 19:07   MR ABDOMEN MRCP W WO CONTAST  Result Date: 03/14/2021 CLINICAL DATA:  75 year old female with history of abdominal pain. Evaluate for biliary tract obstruction. EXAM: MRI ABDOMEN WITHOUT AND WITH CONTRAST (INCLUDING MRCP) TECHNIQUE: Multiplanar multisequence MR imaging of the abdomen was performed both before and after the administration of intravenous contrast. Heavily T2-weighted images of the biliary and pancreatic ducts were obtained, and three-dimensional MRCP images were rendered by post processing. CONTRAST:  68mL GADAVIST GADOBUTROL 1 MMOL/ML IV SOLN COMPARISON:  Abdominal MRI 08/09/2020. CT the abdomen and pelvis 03/13/2021. FINDINGS: Lower chest: Unremarkable. Hepatobiliary: No suspicious cystic or solid hepatic lesions. Status post cholecystectomy. MRCP images demonstrate very mild intra and extrahepatic biliary ductal dilatation. Common bile duct measures 8 mm in the porta  hepatis, which may be within normal limits for this post cholecystectomy patient. There is a small filling defect in the distal common bile duct (coronal MRCP image 20 of series 14) measuring 5 mm. Several other signal voids are noted within the intrahepatic biliary tree, likely reflective of pneumobilia given the findings on recent CT examination. Pancreas: No pancreatic mass. No pancreatic ductal dilatation noted on MRCP images. No pancreatic or peripancreatic fluid collections or inflammatory changes. Spleen:  Unremarkable. Adrenals/Urinary Tract: T1 hypointense, T2 hyperintense, nonenhancing  lesions in the left kidney, compatible with simple cysts, largest of which measures 3.5 cm in the lower pole. Right kidney and bilateral adrenal glands are normal in appearance. No hydroureteronephrosis in the visualized portions of the abdomen. Stomach/Bowel: Visualized portions are unremarkable. Vascular/Lymphatic: Aortic atherosclerosis, without evidence of aneurysm in the visualized abdominal vasculature. No lymphadenopathy noted in the abdomen. Other: No significant volume of ascites noted in the visualized portions of the peritoneal cavity. Musculoskeletal: No aggressive appearing osseous lesions are noted in the visualized portions of the skeleton. IMPRESSION: 1. There is a small filling defect in the distal common bile duct suspicious for potential choledocholithiasis. However, given the presence of pneumobilia on the recent CT examination, the possibility that this filling defect is susceptibility artifact from pneumobilia is not excluded. There is some mild intra and extrahepatic biliary ductal dilatation. This could simply be reflective of benign post cholecystectomy physiology. However, if there is clinical concern for biliary tract obstruction, further evaluation with ERCP should be considered. 2. Simple (Bosniak class 1) cysts in the left kidney, as above. 3. Aortic atherosclerosis. Electronically Signed   By:  Vinnie Langton M.D.   On: 03/14/2021 07:30    EKG: Personally reviewed.  Rhythm is normal sinus rhythm with heart rate of 63 bpm.  So the prolonged QTc interval of 493.  No dynamic ST segment changes appreciated.  Assessment/Plan  * Choledocholithiasis Patient presenting with epigastric pain nausea and vomiting after eating breakfast Patient reports having similar episodes that been happening approximately once weekly although patient describes this episode is the worst she has ever had Abnormal LFTs concerning for hepatobiliary obstruction such as choledocholithiasis CT imaging however did not reveal a definitive stone however patient is being treated as such due to high clinical suspicion ER provider has already discussed case with Dr. Silverio Decamp with gastroenterology who will ensure that gastroenterology sees patient in consultation in the morning.  Dr. Silverio Decamp is requesting an MRCP be performed and that patient be provided with empiric intravenous antibiotic therapy considering concurrent leukocytosis. MRCP ordered per gastroenterology recommendation Cefepime and Flagyl initiated. Gentle intravenous hydration As needed antiemetics and analgesics N.p.o. in case of GI intervention  AF (paroxysmal atrial fibrillation) (HCC) Currently rate controlled on flecainide Review of EKG reveals slightly elevated QTc, slight increase compared to previous EKG in the system.  May be beneficial to discuss any adjustments to Flecanide therapy with cardiology prior to discharge Temporarily holding Coumadin in preparation for ERCP and any potential biopsies  INR will be repeated in the morning and if above 2.0 can consider administration of correction prior to procedure Monitoring patient on telemetry  Hypothyroidism Documented history of hypothyroidism however patient is no longer on Synthroid\ TSH obtained several months ago was 2.1  Prolonged QT interval QTc interval noted to be 496, up from 472  several weeks ago Considering patient is on flecainide therapy, will discuss other the slightly elevated QTC warrants adjustment of flecainide dosing with cardiology prior to discharge Monitoring electrolytes closely and replacing as necessary Monitoring on telemetry Minimizing use of QT prolonging agent  Essential hypertension Resume patients home regimen of oral antihypertensives Titrate antihypertensive regimen as necessary to achieve adequate BP control PRN intravenous antihypertensives for excessively elevated blood pressure    Mixed hyperlipidemia       Code Status:  Full code  code status decision has been confirmed with: patient Family Communication: Husband is at bedside who has been updated on plan of care.  Status is: Inpatient  Remains inpatient appropriate  because:   Choledocholithiasis with concern for cholangitis requiring intravenous fluids, empiric intravenous antibiotics, close clinical monitoring with expert consultation and serial abdominal examinations.        Vernelle Emerald MD [facility] Pager [phone]  If 7PM-7AM, please contact night-coverage [email or web address] Use universal [place] password for that web site. If you do not have the password, please call the hospital operator.  03/14/2021, 8:25 AM

## 2021-03-13 NOTE — ED Provider Notes (Signed)
[place] EMERGENCY DEPARTMENT Provider Note   CSN: [id number] Arrival date & time: 03/13/21  1825     History No chief complaint on file.   [name] is a 75 y.o. female history of hypertension, A. fib on Coumadin, here presenting with vomiting.  Patient states that she ate some peppers gravy with eggs around 11 AM.  She states that shortly afterwards she began having nausea and vomiting.  She was noted to be orthostatic per EMS.  She was given 250 mL of IV fluid and 4 mg of Zofran prior to arrival.  Patient was complaining of some chest pain and burning sensation as well.  Denies any CAD or cardiac stents.  Patient had previous cholecystectomy.  Denies any previous bowel obstructions.  The history is provided by the patient.      Past Medical History:  Diagnosis Date   Borderline hyperlipidemia    Borderline hypertension    Complication of anesthesia    PONV   Hypertension    Hypothyroid    s/p PTU therapy   PAF (paroxysmal atrial fibrillation) [facility])     Patient Active Problem List   Diagnosis Date Noted   Abnormal pancreas function test    Choledocholithiasis    Abdominal pain 07/12/2020   Hypokalemia 07/12/2020   Hyponatremia 07/12/2020   Coagulopathy ([place]) 07/12/2020   Abdominal pain, epigastric 06/15/2020   Elevated LFTs 06/15/2020   Abnormal finding on imaging 06/15/2020   Educated about COVID-19 virus infection 05/04/2019   Allergic asthma 06/12/2010   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) 06/12/2010   Perennial allergic rhinitis with seasonal variation 05/04/2010   UNSPECIFIED TACHYCARDIA 12/19/2009   Obesity, unspecified 09/16/2008   Hypothyroidism 09/15/2008   DYSLIPIDEMIA 09/15/2008   Essential hypertension 09/15/2008   ATRIAL FIBRILLATION, PAROXYSMAL 09/15/2008    Past Surgical History:  Procedure Laterality Date   BILIARY DILATION  07/14/2020   Procedure: BILIARY DILATION;  Surgeon: Milus Banister, MD;  Location: [facility]  ENDOSCOPY;  Service: Endoscopy;;   CESAREAN SECTION     CHOLECYSTECTOMY     ENDOSCOPIC RETROGRADE CHOLANGIOPANCREATOGRAPHY (ERCP) WITH PROPOFOL N/A 07/14/2020   Procedure: ENDOSCOPIC RETROGRADE CHOLANGIOPANCREATOGRAPHY (ERCP) WITH PROPOFOL;  Surgeon: Milus Banister, MD;  Location: [facility] ENDOSCOPY;  Service: Endoscopy;  Laterality: N/A;   ESOPHAGOGASTRODUODENOSCOPY (EGD) WITH PROPOFOL N/A 07/14/2020   Procedure: ESOPHAGOGASTRODUODENOSCOPY (EGD) WITH PROPOFOL;  Surgeon: Milus Banister, MD;  Location: [facility] ENDOSCOPY;  Service: Endoscopy;  Laterality: N/A;   KNEE SURGERY     right 2014, left 2015, arthroscipic   LEG SURGERY     Left leg d/t  tumor (benign)   REMOVAL OF STONES  07/14/2020   Procedure: REMOVAL OF STONES;  Surgeon: Milus Banister, MD;  Location: [facility] ENDOSCOPY;  Service: Endoscopy;;   SPHINCTEROTOMY  07/14/2020   Procedure: [name];  Surgeon: Milus Banister, MD;  Location: [place];  Service: Endoscopy;;   THYROID SURGERY     UPPER ESOPHAGEAL ENDOSCOPIC ULTRASOUND (EUS) N/A 07/14/2020   Procedure: UPPER ESOPHAGEAL ENDOSCOPIC ULTRASOUND (EUS);  Surgeon: Milus Banister, MD;  Location: [facility] ENDOSCOPY;  Service: Endoscopy;  Laterality: N/A;     OB History   No obstetric history on file.     Family History  Problem Relation Age of Onset   Lung cancer Father    Breast cancer Mother        in 26's   Cirrhosis Brother    Diabetes Brother    Breast cancer Sister 68  Colon cancer Neg Hx    Colon polyps Neg Hx     Social History   Tobacco Use   Smoking status: Former    Packs/day: 0.80    Years: 5.00    Pack years: 4.00    Types: Cigarettes    Quit date: 04/08/1982    Years since quitting: 38.9   Smokeless tobacco: Never  Vaping Use   Vaping Use: Never used  Substance Use Topics   Alcohol use: No   Drug use: No    Home Medications Prior to Admission medications   Medication Sig Start Date End Date Taking? Authorizing Provider  flecainide (TAMBOCOR) 100 MG  tablet Take 1 tablet (100 mg total) by mouth 2 (two) times daily. 08/04/20   Minus Breeding, MD  omeprazole (PRILOSEC) 10 MG capsule Take 10 mg by mouth daily as needed (acid).     [provider]  potassium chloride SA (KLOR-CON) 20 MEQ tablet Take 1 tablet (20 mEq total) by mouth 2 (two) times daily. 59/74/16   Delora Fuel, MD  warfarin (COUMADIN) 5 MG tablet TAKE 1 to 2 TABLETS BY MOUTH ONCE DAILY OR AS  DIRECTED  BY  COUMADIN  CLINIC 10/23/20   Minus Breeding, MD    Allergies    Chocolate, Erythromycin, Other, Phenergan [promethazine], Shellfish allergy, Naproxen sodium, Penicillins, and Promethazine hcl  Review of Systems   Review of Systems  Gastrointestinal:  Positive for abdominal pain and vomiting.  All other systems reviewed and are negative.  Physical Exam Updated Vital Signs BP (!) 174/63   Pulse 64   Temp 97.8 F (36.6 C)   Resp (!) 24   Ht 4' 11"  (1.499 m)   Wt 90.3 kg   SpO2 99%   BMI 40.19 kg/m   Physical Exam Vitals and nursing note reviewed.  Constitutional:      Comments: Uncomfortable, vomiting  HENT:     Head: Normocephalic.     Nose: Nose normal.     Mouth/Throat:     Mouth: Mucous membranes are dry.  Eyes:     Extraocular Movements: Extraocular movements intact.     Pupils: Pupils are equal, round, and reactive to light.  Cardiovascular:     Rate and Rhythm: Normal rate and regular rhythm.     Pulses: Normal pulses.     Heart sounds: Normal heart sounds.  Pulmonary:     Effort: Pulmonary effort is normal.     Breath sounds: Normal breath sounds.  Abdominal:     General: Abdomen is flat.     Comments: + Epigastric tenderness and mild guarding  Musculoskeletal:        General: Normal range of motion.     Cervical back: Normal range of motion and neck supple.  Skin:    General: Skin is warm.     Capillary Refill: Capillary refill takes less than 2 seconds.  Neurological:     General: No focal deficit present.     Mental Status:  She is oriented to person, place, and time.  Psychiatric:        Mood and Affect: Mood normal.        Behavior: Behavior normal.    ED Results / Procedures / Treatments   Labs (all labs ordered are listed, but only abnormal results are displayed) Labs Reviewed  CBC WITH DIFFERENTIAL/PLATELET - Abnormal; Notable for the following components:      Result Value   WBC 13.1 (*)    Neutro Abs 10.9 (*)  Monocytes Absolute 1.1 (*)    All other components within normal limits  COMPREHENSIVE METABOLIC PANEL - Abnormal; Notable for the following components:   Glucose, Bld 124 (*)    BUN 26 (*)    Creatinine, Ser 1.28 (*)    Calcium 8.6 (*)    Albumin 2.8 (*)    AST 205 (*)    ALT 122 (*)    Alkaline Phosphatase 642 (*)    Total Bilirubin 5.8 (*)    GFR, Estimated 44 (*)    All other components within normal limits  PROTIME-INR - Abnormal; Notable for the following components:   Prothrombin Time 23.6 (*)    INR 2.1 (*)    All other components within normal limits  RESP PANEL BY RT-PCR (FLU A&B, COVID) ARPGX2  CULTURE, BLOOD (ROUTINE X 2)  CULTURE, BLOOD (ROUTINE X 2)  LIPASE, BLOOD  LACTIC ACID, PLASMA  LACTIC ACID, PLASMA  TROPONIN I (HIGH SENSITIVITY)  TROPONIN I (HIGH SENSITIVITY)    EKG EKG Interpretation  Date/Time:  Tuesday March 13 2021 18:31:39 EST Ventricular Rate:  63 PR Interval:  195 QRS Duration: 103 QT Interval:  484 QTC Calculation: 496 R Axis:   -9 Text Interpretation: Sinus rhythm Left ventricular hypertrophy Borderline prolonged QT interval No significant change since last tracing Confirmed by Wandra Arthurs [phone]) on 03/13/2021 6:35:37 PM  Radiology CT ABDOMEN PELVIS W CONTRAST  Result Date: 03/13/2021 CLINICAL DATA:  Abdominal distension. EXAM: CT ABDOMEN AND PELVIS WITH CONTRAST TECHNIQUE: Multidetector CT imaging of the abdomen and pelvis was performed using the standard protocol following bolus administration of intravenous contrast. CONTRAST:   35m OMNIPAQUE IOHEXOL 300 MG/ML  SOLN COMPARISON:  CT abdomen and pelvis 12/10/2005. FINDINGS: Lower chest: No acute abnormality. Hepatobiliary: Status post cholecystectomy. There is no biliary ductal dilatation. Pneumobilia has increased when compared to the prior study. There is no focal liver lesion identified. Pancreas: Fatty replaced, unchanged. Spleen: Normal in size without focal abnormality. Adrenals/Urinary Tract: Left renal cysts are present measuring up to 2.6 cm. These are new from the prior examination. There is a nonobstructing 5 mm left renal calculus. There is no hydronephrosis or perinephric stranding. Adrenal glands and bladder are within normal limits. Stomach/Bowel: Stomach is within normal limits. no evidence of bowel wall thickening, distention, or inflammatory changes. There is sigmoid colon diverticulosis without evidence for diverticulitis. The appendix is not seen. Vascular/Lymphatic: Aortic atherosclerosis. No enlarged abdominal or pelvic lymph nodes. Reproductive: Status post hysterectomy. No adnexal masses. Other: No abdominal wall hernia or abnormality. No abdominopelvic ascites. Musculoskeletal: No acute or significant osseous findings. IMPRESSION: 1.  No acute localizing process in the abdomen or pelvis. 2. Sigmoid colon diverticulosis without evidence for diverticulitis. 3. Cholecystectomy. Pneumobilia is present likely related to prior sphincterotomy. Please correlate clinically. 4.  Aortic Atherosclerosis (ICD10-I70.0). 5.  Nonobstructing left renal calculus.  New left renal cysts. Electronically Signed   By: ARonney AstersM.D.   On: 03/13/2021 21:28   DG Chest Port 1 View  Result Date: 03/13/2021 CLINICAL DATA:  Nausea and vomiting EXAM: PORTABLE CHEST 1 VIEW COMPARISON:  02/26/2021 FINDINGS: The heart size and mediastinal contours are within normal limits. Both lungs are clear. The visualized skeletal structures are unremarkable. IMPRESSION: No active disease. Electronically  Signed   By: MInez CatalinaM.D.   On: 03/13/2021 19:07    Procedures Procedures   Medications Ordered in ED Medications  morphine 4 MG/ML injection 4 mg (has no administration in time range)  sodium  chloride 0.9 % bolus 1,000 mL (0 mLs Intravenous Stopped 03/13/21 2016)  morphine 4 MG/ML injection 4 mg (4 mg Intravenous Given 03/13/21 1909)  famotidine (PEPCID) IVPB 20 mg premix (0 mg Intravenous Stopped 03/13/21 2016)  iohexol (OMNIPAQUE) 300 MG/ML solution 80 mL (80 mLs Intravenous Contrast Given 03/13/21 2059)    ED Course  I have reviewed the triage vital signs and the nursing notes.  Pertinent labs & imaging results that were available during my care of the patient were reviewed by me and considered in my medical decision making (see chart for details).    MDM Rules/Calculators/A&P                           [name] is a 75 y.o. female here presenting with abdominal pain and vomiting.  Patient ate some peppered eggs and then had vomiting.  Consider gastritis versus SBO versus pancreatitis.  Plan to get CBC and CMP and lipase and CT abdomen pelvis.  We will give IV fluids and Pepcid and Zofran.   10:42 PM Patient's labs showed elevated AST ALT and alk phos of 600 and bili of 5.8.  Her CT showed pneumobilia and no obvious obstructing stone.  However clinically I think she likely has an obstructing stone.  I discussed case with the [place] doctor, Dr. Silverio Decamp.  She recommend IV antibiotics in case she is developing cholangitis.  She also recommend MRCP. Hospitalist to admit   Final Clinical Impression(s) / ED Diagnoses Final diagnoses:  None    Rx / DC Orders ED Discharge Orders     None        Drenda Freeze, MD 03/13/21 2243

## 2021-03-13 NOTE — ED Notes (Signed)
Accidentally documented meds being given via IV in right AC.  That was old documentation. Current IV is in left [facility] and has been updated in computer.

## 2021-03-13 NOTE — Progress Notes (Signed)
Pharmacy Antibiotic Note  [name] is a 75 y.o. female admitted on 03/13/2021 with  intra-abdominal infection .  Pharmacy has been consulted for cefepime dosing. Also on metronidazole for anaerobic coverage.   WBC mildly elevated. SCr 1.28.   Plan: -Cefepime 2 gm IV Q 12 hours -Flagyl 500 mg IV Q 12 hours -Monitor CBC, renal fx, cultures and clinical progress  Height: 4\' 11"  (149.9 cm) Weight: 90.3 kg (199 lb) IBW/kg (Calculated) : 43.2  Temp (24hrs), Avg:97.8 F (36.6 C), Min:97.8 F (36.6 C), Max:97.8 F (36.6 C)  Recent Labs  Lab 03/13/21 1834  WBC 13.1*  CREATININE 1.28*    Estimated Creatinine Clearance: 38.3 mL/min (A) (by C-G formula based on SCr of 1.28 mg/dL (H)).    Allergies  Allergen Reactions   Chocolate Itching    Itching mouth   Erythromycin     Other reaction(s): Unknown   Other Itching    Strawberry , white potatoes, corn, soy, cat dander, fish   Phenergan [Promethazine] Other (See Comments)    Pt and family states her nausea worsens with phenergan rather than improving to the point she states she cant have it.    Shellfish Allergy    Naproxen Sodium Other (See Comments)    Hurt all over   Penicillins Rash    Has patient had a PCN reaction causing immediate rash, facial/tongue/throat swelling, SOB or lightheadedness with hypotension: Yes Has patient had a PCN reaction causing severe rash involving mucus membranes or skin necrosis: Yes Has patient had a PCN reaction that required hospitalization: No Has patient had a PCN reaction occurring within the last 10 years: No If all of the above answers are "NO", then may proceed with Cephalosporin use.    Promethazine Hcl Other (See Comments)    Sick     Antimicrobials this admission: Cefepime 12/6 >>  Metronidazole 12/6 >>   Dose adjustments this admission:   Microbiology results: 12/6 BCx:    Thank you for allowing pharmacy to be a part of this patient's care.  Albertina Parr,  PharmD., BCPS, BCCCP Clinical Pharmacist Please refer to [facility] for unit-specific pharmacist

## 2021-03-13 NOTE — ED Triage Notes (Addendum)
PT BIB [facility] for CP/epigatric pain.  PT ate some peppered gravy with eggs around 11am.  She hasnt eaten the gravy before.  The pain, accompanied by N/V began about an hr later.  EMS reports orthostatic changes and began fluid.  Pt received approx. 266mL prior to arrival and 4mg  of IV zofran on arrival. PT is A&O x4 with 20 G L. AC.  Pt is on Coumadin so she was not given ASA.

## 2021-03-14 ENCOUNTER — Encounter (HOSPITAL_COMMUNITY): Payer: Self-pay | Admitting: Internal Medicine

## 2021-03-14 ENCOUNTER — Inpatient Hospital Stay (HOSPITAL_COMMUNITY): Payer: Medicare PPO

## 2021-03-14 ENCOUNTER — Other Ambulatory Visit: Payer: Self-pay

## 2021-03-14 DIAGNOSIS — R1011 Right upper quadrant pain: Secondary | ICD-10-CM | POA: Diagnosis not present

## 2021-03-14 DIAGNOSIS — K8050 Calculus of bile duct without cholangitis or cholecystitis without obstruction: Secondary | ICD-10-CM

## 2021-03-14 LAB — COMPREHENSIVE METABOLIC PANEL
ALT: 115 U/L — ABNORMAL HIGH (ref 0–44)
AST: 185 U/L — ABNORMAL HIGH (ref 15–41)
Albumin: 2.4 g/dL — ABNORMAL LOW (ref 3.5–5.0)
Alkaline Phosphatase: 611 U/L — ABNORMAL HIGH (ref 38–126)
Anion gap: 9 (ref 5–15)
BUN: 23 mg/dL (ref 8–23)
CO2: 23 mmol/L (ref 22–32)
Calcium: 7.9 mg/dL — ABNORMAL LOW (ref 8.9–10.3)
Chloride: 101 mmol/L (ref 98–111)
Creatinine, Ser: 1.15 mg/dL — ABNORMAL HIGH (ref 0.44–1.00)
GFR, Estimated: 50 mL/min — ABNORMAL LOW (ref >60)
Glucose, Bld: 105 mg/dL — ABNORMAL HIGH (ref 70–99)
Potassium: 4.3 mmol/L (ref 3.5–5.1)
Sodium: 133 mmol/L — ABNORMAL LOW (ref 135–145)
Total Bilirubin: 5.8 mg/dL — ABNORMAL HIGH (ref 0.3–1.2)
Total Protein: 6.2 g/dL — ABNORMAL LOW (ref 6.5–8.1)

## 2021-03-14 LAB — CBC WITH DIFFERENTIAL/PLATELET
Abs Immature Granulocytes: 0.11 10*3/uL — ABNORMAL HIGH (ref 0.00–0.07)
Basophils Absolute: 0.1 10*3/uL (ref 0.0–0.1)
Basophils Relative: 0 %
Eosinophils Absolute: 0 10*3/uL (ref 0.0–0.5)
Eosinophils Relative: 0 %
HCT: 35.3 % — ABNORMAL LOW (ref 36.0–46.0)
Hemoglobin: 11.9 g/dL — ABNORMAL LOW (ref 12.0–15.0)
Immature Granulocytes: 1 %
Lymphocytes Relative: 7 %
Lymphs Abs: 1.5 10*3/uL (ref 0.7–4.0)
MCH: 30.3 pg (ref 26.0–34.0)
MCHC: 33.7 g/dL (ref 30.0–36.0)
MCV: 89.8 fL (ref 80.0–100.0)
Monocytes Absolute: 1.3 10*3/uL — ABNORMAL HIGH (ref 0.1–1.0)
Monocytes Relative: 6 %
Neutro Abs: 18.3 10*3/uL — ABNORMAL HIGH (ref 1.7–7.7)
Neutrophils Relative %: 86 %
Platelets: 250 10*3/uL (ref 150–400)
RBC: 3.93 MIL/uL (ref 3.87–5.11)
RDW: 15.3 % (ref 11.5–15.5)
WBC: 21.2 10*3/uL — ABNORMAL HIGH (ref 4.0–10.5)
nRBC: 0 % (ref 0.0–0.2)

## 2021-03-14 LAB — MAGNESIUM
Magnesium: 1.8 mg/dL (ref 1.7–2.4)
Magnesium: 1.8 mg/dL (ref 1.7–2.4)

## 2021-03-14 LAB — LACTIC ACID, PLASMA
Lactic Acid, Venous: 2.6 mmol/L (ref 0.5–1.9)
Lactic Acid, Venous: 1.3 mmol/L (ref 0.5–1.9)

## 2021-03-14 LAB — PROTIME-INR
INR: 2.4 — ABNORMAL HIGH (ref 0.8–1.2)
Prothrombin Time: 25.8 seconds — ABNORMAL HIGH (ref 11.4–15.2)

## 2021-03-14 MED ORDER — FLECAINIDE ACETATE 100 MG PO TABS
100.0000 mg | ORAL_TABLET | Freq: Two times a day (BID) | ORAL | Status: DC
  Administered 2021-03-14 – 2021-03-16 (×4): 100 mg via ORAL
  Filled 2021-03-14 (×6): qty 1

## 2021-03-14 MED ORDER — VANCOMYCIN HCL 1000 MG/200ML IV SOLN
1000.0000 mg | INTRAVENOUS | Status: DC
  Administered 2021-03-14 – 2021-03-15 (×2): 1000 mg via INTRAVENOUS
  Filled 2021-03-14 (×2): qty 200

## 2021-03-14 MED ORDER — WARFARIN - PHARMACIST DOSING INPATIENT: Freq: Every day | Status: DC

## 2021-03-14 MED ORDER — LACTATED RINGERS IV SOLN: INTRAVENOUS | Status: DC

## 2021-03-14 MED ORDER — PHYTONADIONE 5 MG PO TABS
5.0000 mg | ORAL_TABLET | Freq: Once | ORAL | Status: AC
  Administered 2021-03-14: 5 mg via ORAL
  Filled 2021-03-14: qty 1

## 2021-03-14 MED ORDER — METRONIDAZOLE 500 MG/100ML IV SOLN
500.0000 mg | Freq: Three times a day (TID) | INTRAVENOUS | Status: DC
  Administered 2021-03-14 – 2021-03-16 (×6): 500 mg via INTRAVENOUS
  Filled 2021-03-14 (×6): qty 100

## 2021-03-14 MED ORDER — GADOBUTROL 1 MMOL/ML IV SOLN
9.0000 mL | Freq: Once | INTRAVENOUS | Status: AC | PRN
  Administered 2021-03-14: 9 mL via INTRAVENOUS

## 2021-03-14 MED ORDER — HYDRALAZINE HCL 20 MG/ML IJ SOLN: 10.0000 mg | Freq: Four times a day (QID) | INTRAMUSCULAR | Status: DC | PRN

## 2021-03-14 MED ORDER — LACTATED RINGERS IV BOLUS: 1000.0000 mL | Freq: Once | INTRAVENOUS | Status: DC

## 2021-03-14 MED ORDER — SODIUM CHLORIDE 0.9 % IV SOLN
2.0000 g | INTRAVENOUS | Status: DC
  Administered 2021-03-14 – 2021-03-15 (×2): 2 g via INTRAVENOUS
  Filled 2021-03-14 (×2): qty 20

## 2021-03-14 MED ORDER — LACTATED RINGERS IV BOLUS
1000.0000 mL | Freq: Once | INTRAVENOUS | Status: AC
  Administered 2021-03-14: 1000 mL via INTRAVENOUS

## 2021-03-14 NOTE — H&P (View-Only) (Signed)
[facility] Consult: 9:42 AM 03/14/2021  LOS: 1 day    Referring Provider: Dr. Raelyn Mora Primary Care Physician:  Kelton Pillar, MD Primary Gastroenterologist:  Dr. Ardis Hughs    Reason for Consultation: Recurrent choledocholithiasis.   HPI: [name] is a 75 y.o. female.  PMH HTN.  HLD.  A. fib on chronic Coumadin.  PTU therapy to thyroid.  Cholecystectomy.  C-section.  Abdominal hysterectomy with unilateral oophorectomy for what sounds like fibroids. 2 separate ERCPs in 1999 following cholecystectomy. Seen as inpatient in April regarding upward trending T bili over 4 to 6 weeks. Hepatitis A IgM nonreactive.  Hepatitis B surface Ag nonreactive.  Hepatitis B core IgM nonreactive.  HCV antibody nonreactive. Ceruloplasmin normal.  IgG normal.  A1 AT elevated 240.  ANA positive with cytoplasmic pattern, 1:80 titer.  Mitochondrial antibody 20 which is in normal range.  IgG 1460 WBCs 11.3 >> 12.3 over 2 weeks INR is 6.2 today.  Hb 11.9. 05/11/2020 abdominal ultrasound: Gallbladder surgically absent.  3.9 mm CBD.  Normal liver.  Normal portal vein.  Normal spleen.  Cysts in the left kidney.  No explanation for abnormal LFTs. 05/25/2020 MRI abdomen/pelvis: Mild intrahepatic biliary ductal prominence.  Upper normal diameter of extrahepatic biliary duct.  No obstructing mass, lesion or choledocholithiasis.  6.7 mm nodule at head of pancreas/ampullary region.  PD normal.  Tiny foci in both kidneys, too small to characterize but likely benign. 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  EUS findings showed sludge, stone debris in the CBD.  No  pancreatic/ampullary nodule.  Normal pancreatic parenchyma but imaging limited by diverticulum containing food at distal duodenum, main PD normal, reactive looking periportal lymph node.  3 weeks nonradiating epigastric pain, sweats.Martin Majestic to ED 11/21 for this, labeled as "atypical chest pain: and was prescribed daily omeprazole.  No abdominal imaging performed.  Her potassium was 3.3.  LFTs not obtained.  Troponin I not elevated.  WBCs were 14.1.  INR 2.2. Since then, despite the omeprazole she has had intermittent epigastric discomfort.  Late yesterday morning the pain became very severe, associated with sweats and clear, nonbloody emesis, nausea.  She threw up and had persistent pain for several hours and finally early yesterday evening came to the ED.    T bili 5.8.  Alkaline phosphatase 611.  AST/ALT 185/115.  WBCs 21.2.  Hb 11.9.  Normal platelets.  Troponin I not elevated.  BUN normal, creatinine slightly elevated at 1.15.  Na 133. CTAP w contrast: Pneumobilia.  Sigmoid diverticulosis.  Nonobstructing left kidney stone.  Nothing acute or localizing to explain symptoms. MR abdomen/MRCP: Small filling defect, potentially choledocholithiasis, at the distal CBD.  Mild intra and extrahepatic biliary ductal dilatation.  Left kidney cysts.  Aortic atherosclerosis.  Patient lives with her husband.  Does not smoke or drink.  Past Medical History:  Diagnosis Date   Borderline hyperlipidemia    Borderline hypertension    Complication  of anesthesia    PONV   Difficult intubation    Hypertension    Hypothyroid    s/p PTU therapy   Hypothyroidism 09/15/2008   Qualifier: Diagnosis of  By: Percival Spanish, MD, Farrel Gordon     PAF (paroxysmal atrial fibrillation) [facility])     Past Surgical History:  Procedure Laterality Date   BILIARY DILATION  07/14/2020   Procedure: BILIARY DILATION;  Surgeon: Milus Banister, MD;  Location: [facility] ENDOSCOPY;  Service: Endoscopy;;   CESAREAN SECTION     CHOLECYSTECTOMY      ENDOSCOPIC RETROGRADE CHOLANGIOPANCREATOGRAPHY (ERCP) WITH PROPOFOL N/A 07/14/2020   Procedure: ENDOSCOPIC RETROGRADE CHOLANGIOPANCREATOGRAPHY (ERCP) WITH PROPOFOL;  Surgeon: Milus Banister, MD;  Location: [facility] ENDOSCOPY;  Service: Endoscopy;  Laterality: N/A;   ESOPHAGOGASTRODUODENOSCOPY (EGD) WITH PROPOFOL N/A 07/14/2020   Procedure: ESOPHAGOGASTRODUODENOSCOPY (EGD) WITH PROPOFOL;  Surgeon: Milus Banister, MD;  Location: [facility] ENDOSCOPY;  Service: Endoscopy;  Laterality: N/A;   KNEE SURGERY     right 2014, left 2015, arthroscipic   LEG SURGERY     Left leg d/t  tumor (benign)   REMOVAL OF STONES  07/14/2020   Procedure: REMOVAL OF STONES;  Surgeon: Milus Banister, MD;  Location: [facility] ENDOSCOPY;  Service: Endoscopy;;   SPHINCTEROTOMY  07/14/2020   Procedure: [name];  Surgeon: Milus Banister, MD;  Location: [place];  Service: Endoscopy;;   THYROID SURGERY     UPPER ESOPHAGEAL ENDOSCOPIC ULTRASOUND (EUS) N/A 07/14/2020   Procedure: UPPER ESOPHAGEAL ENDOSCOPIC ULTRASOUND (EUS);  Surgeon: Milus Banister, MD;  Location: [facility] ENDOSCOPY;  Service: Endoscopy;  Laterality: N/A;    Prior to Admission medications   Medication Sig Start Date End Date Taking? Authorizing Provider  flecainide (TAMBOCOR) 100 MG tablet Take 1 tablet (100 mg total) by mouth 2 (two) times daily. 08/04/20  Yes Minus Breeding, MD  omeprazole (PRILOSEC) 10 MG capsule Take 10 mg by mouth daily as needed (acid).    Yes [provider]  warfarin (COUMADIN) 5 MG tablet TAKE 1 to 2 TABLETS BY MOUTH ONCE DAILY OR AS  DIRECTED  BY  COUMADIN  CLINIC Patient taking differently: Take 5 mg by mouth daily. 10/23/20  Yes Minus Breeding, MD  potassium chloride SA (KLOR-CON) 20 MEQ tablet Take 1 tablet (20 mEq total) by mouth 2 (two) times daily. Patient not taking: Reported on 82/07/2351 61/44/31   Delora Fuel, MD    Scheduled Meds:  flecainide  100 mg Oral BID   pantoprazole (PROTONIX) IV  40 mg Intravenous Q24H    Infusions:  lactated ringers 100 mL/hr at 03/14/21 0406   PRN Meds: acetaminophen **OR** acetaminophen, hydrALAZINE, polyethylene glycol   Allergies as of 03/13/2021 - Review Complete 03/13/2021  Allergen Reaction Noted   Chocolate Itching 06/09/2019   Erythromycin  05/09/2020   Other Itching 07/12/2020   Phenergan [promethazine] Other (See Comments) 03/13/2021   Shellfish allergy  07/12/2020   Naproxen sodium Other (See Comments)    Penicillins Rash    Promethazine hcl Other (See Comments)     Family History  Problem Relation Age of Onset   Lung cancer Father    Breast cancer Mother        in 1's   Cirrhosis Brother    Diabetes Brother    Breast cancer Sister 66   Colon cancer Neg Hx    Colon polyps Neg Hx     Social History   Socioeconomic History   Marital status: Married    Spouse name: Not  on file   Number of children: Not on file   Years of education: Not on file   Highest education level: Not on file  Occupational History   Not on file  Tobacco Use   Smoking status: Former    Packs/day: 0.80    Years: 5.00    Pack years: 4.00    Types: Cigarettes    Quit date: 04/08/1982    Years since quitting: 38.9   Smokeless tobacco: Never  Vaping Use   Vaping Use: Never used  Substance and Sexual Activity   Alcohol use: No   Drug use: No   Sexual activity: Not on file  Other Topics Concern   Not on file  Social History Narrative   Not on file   Social Determinants of Health   Financial Resource Strain: Not on file  Food Insecurity: Not on file  Transportation Needs: Not on file  Physical Activity: Not on file  Stress: Not on file  Social Connections: Not on file  Intimate Partner Violence: Not on file    REVIEW OF SYSTEMS: Constitutional: Describes orthostatic hypotension for many weeks.  Has not had syncope. ENT:  No nose bleeds Pulm: No shortness of breath or cough CV:  No palpitations, no LE edema.  No angina. GU: Urine has been dark  intermittently for the past 3 weeks.  No hematuria, no frequency GI: See HPI. Heme: Develops purpura and small bruises fairly easily but no excessive bleeding. Transfusions: Received platelets in April 2022 Neuro: Orthostatic dizziness as above.  No headaches, no peripheral tingling or numbness Derm:  No itching, no rash or sores.  Endocrine:  No sweats or chills.  No polyuria or dysuria Immunization: Reviewed. Travel:  None beyond local counties in last few months.    PHYSICAL EXAM: Vital signs in last 24 hours: Vitals:   03/14/21 0800 03/14/21 0900  BP: (!) 156/57 (!) 165/57  Pulse: (!) 54 (!) 54  Resp: 13 20  Temp:    SpO2: 96% 97%   Wt Readings from Last 3 Encounters:  03/13/21 90.3 kg  11/17/20 98.9 kg  08/31/20 95.7 kg    General: Patient is icteric.  Resting comfortably and alert/awake on the stretcher. Head: No facial asymmetry or swelling.  No signs of head trauma. Eyes: Slight scleral icterus Ears: Not hard of hearing Nose: No congestion or discharge Mouth: Dentures in place on top.  She not removed for exam.  She is edentulous.  Tongue midline.  Mucosa moist, pink, clear. Neck: No JVD, no masses, no thyromegaly Lungs: Clear bilaterally without labored breathing or cough. Heart: RRR.  No MRG.  S1, S2 present Abdomen: Soft without distention.  Tender diffusely but more so in the epigastric area.  No guarding or rebound.  No HSM, masses, bruits, hernias.   Rectal: Deferred Musc/Skeltl: No joint redness, swelling or gross deformity. Extremities: No CCE. Neurologic: Alert.  Appropriate.  Oriented x3.  Able to provide good history.  Moves all 4 limbs without tremor, strength not tested. Skin: No obvious jaundice. Tattoos: None Nodes: No cervical adenopathy Psych: Calm, pleasant, cooperative.  Intake/Output from previous day: 12/06 0701 - 12/07 0700 In: 2000 [IV Piggyback:2000] Out: -  Intake/Output this shift: No intake/output data recorded.  LAB  RESULTS: Recent Labs    03/13/21 1834 03/14/21 0419  WBC 13.1* 21.2*  HGB 12.2 11.9*  HCT 37.7 35.3*  PLT 222 250   BMET Lab Results  Component Value Date   NA 133 (L) 03/14/2021  NA 135 03/13/2021   NA 138 02/26/2021   K 4.3 03/14/2021   K 4.3 03/13/2021   K 3.3 (L) 02/26/2021   CL 101 03/14/2021   CL 100 03/13/2021   CL 101 02/26/2021   CO2 23 03/14/2021   CO2 25 03/13/2021   CO2 27 02/26/2021   GLUCOSE 105 (H) 03/14/2021   GLUCOSE 124 (H) 03/13/2021   GLUCOSE 122 (H) 02/26/2021   BUN 23 03/14/2021   BUN 26 (H) 03/13/2021   BUN 17 02/26/2021   CREATININE 1.15 (H) 03/14/2021   CREATININE 1.28 (H) 03/13/2021   CREATININE 1.10 (H) 02/26/2021   CALCIUM 7.9 (L) 03/14/2021   CALCIUM 8.6 (L) 03/13/2021   CALCIUM 8.6 (L) 02/26/2021   LFT Recent Labs    03/13/21 1834 03/14/21 0419  PROT 7.2 6.2*  ALBUMIN 2.8* 2.4*  AST 205* 185*  ALT 122* 115*  ALKPHOS 642* 611*  BILITOT 5.8* 5.8*   PT/INR Lab Results  Component Value Date   INR 2.4 (H) 03/14/2021   INR 2.1 (H) 03/13/2021   INR 2.2 (H) 02/26/2021   Hepatitis Panel No results for input(s): HEPBSAG, HCVAB, HEPAIGM, HEPBIGM in the last 72 hours. C-Diff No components found for: CDIFF Lipase     Component Value Date/Time   LIPASE 30 03/13/2021 1834    Drugs of Abuse  No results found for: LABOPIA, COCAINSCRNUR, LABBENZ, AMPHETMU, THCU, LABBARB   RADIOLOGY STUDIES: CT ABDOMEN PELVIS W CONTRAST  Result Date: 03/13/2021 CLINICAL DATA:  Abdominal distension. EXAM: CT ABDOMEN AND PELVIS WITH CONTRAST TECHNIQUE: Multidetector CT imaging of the abdomen and pelvis was performed using the standard protocol following bolus administration of intravenous contrast. CONTRAST:  93mL OMNIPAQUE IOHEXOL 300 MG/ML  SOLN COMPARISON:  CT abdomen and pelvis 12/10/2005. FINDINGS: Lower chest: No acute abnormality. Hepatobiliary: Status post cholecystectomy. There is no biliary ductal dilatation. Pneumobilia has increased when  compared to the prior study. There is no focal liver lesion identified. Pancreas: Fatty replaced, unchanged. Spleen: Normal in size without focal abnormality. Adrenals/Urinary Tract: Left renal cysts are present measuring up to 2.6 cm. These are new from the prior examination. There is a nonobstructing 5 mm left renal calculus. There is no hydronephrosis or perinephric stranding. Adrenal glands and bladder are within normal limits. Stomach/Bowel: Stomach is within normal limits. no evidence of bowel wall thickening, distention, or inflammatory changes. There is sigmoid colon diverticulosis without evidence for diverticulitis. The appendix is not seen. Vascular/Lymphatic: Aortic atherosclerosis. No enlarged abdominal or pelvic lymph nodes. Reproductive: Status post hysterectomy. No adnexal masses. Other: No abdominal wall hernia or abnormality. No abdominopelvic ascites. Musculoskeletal: No acute or significant osseous findings. IMPRESSION: 1.  No acute localizing process in the abdomen or pelvis. 2. Sigmoid colon diverticulosis without evidence for diverticulitis. 3. Cholecystectomy. Pneumobilia is present likely related to prior sphincterotomy. Please correlate clinically. 4.  Aortic Atherosclerosis (ICD10-I70.0). 5.  Nonobstructing left renal calculus.  New left renal cysts. Electronically Signed   By: Ronney Asters M.D.   On: 03/13/2021 21:28   DG Chest Port 1 View  Result Date: 03/13/2021 CLINICAL DATA:  Nausea and vomiting EXAM: PORTABLE CHEST 1 VIEW COMPARISON:  02/26/2021 FINDINGS: The heart size and mediastinal contours are within normal limits. Both lungs are clear. The visualized skeletal structures are unremarkable. IMPRESSION: No active disease. Electronically Signed   By: Inez Catalina M.D.   On: 03/13/2021 19:07   MR ABDOMEN MRCP W WO CONTAST  Result Date: 03/14/2021 CLINICAL DATA:  75 year old female with history  of abdominal pain. Evaluate for biliary tract obstruction. EXAM: MRI ABDOMEN  WITHOUT AND WITH CONTRAST (INCLUDING MRCP) TECHNIQUE: Multiplanar multisequence MR imaging of the abdomen was performed both before and after the administration of intravenous contrast. Heavily T2-weighted images of the biliary and pancreatic ducts were obtained, and three-dimensional MRCP images were rendered by post processing. CONTRAST:  25mL GADAVIST GADOBUTROL 1 MMOL/ML IV SOLN COMPARISON:  Abdominal MRI 08/09/2020. CT the abdomen and pelvis 03/13/2021. FINDINGS: Lower chest: Unremarkable. Hepatobiliary: No suspicious cystic or solid hepatic lesions. Status post cholecystectomy. MRCP images demonstrate very mild intra and extrahepatic biliary ductal dilatation. Common bile duct measures 8 mm in the porta hepatis, which may be within normal limits for this post cholecystectomy patient. There is a small filling defect in the distal common bile duct (coronal MRCP image 20 of series 14) measuring 5 mm. Several other signal voids are noted within the intrahepatic biliary tree, likely reflective of pneumobilia given the findings on recent CT examination. Pancreas: No pancreatic mass. No pancreatic ductal dilatation noted on MRCP images. No pancreatic or peripancreatic fluid collections or inflammatory changes. Spleen:  Unremarkable. Adrenals/Urinary Tract: T1 hypointense, T2 hyperintense, nonenhancing lesions in the left kidney, compatible with simple cysts, largest of which measures 3.5 cm in the lower pole. Right kidney and bilateral adrenal glands are normal in appearance. No hydroureteronephrosis in the visualized portions of the abdomen. Stomach/Bowel: Visualized portions are unremarkable. Vascular/Lymphatic: Aortic atherosclerosis, without evidence of aneurysm in the visualized abdominal vasculature. No lymphadenopathy noted in the abdomen. Other: No significant volume of ascites noted in the visualized portions of the peritoneal cavity. Musculoskeletal: No aggressive appearing osseous lesions are noted in the  visualized portions of the skeleton. IMPRESSION: 1. There is a small filling defect in the distal common bile duct suspicious for potential choledocholithiasis. However, given the presence of pneumobilia on the recent CT examination, the possibility that this filling defect is susceptibility artifact from pneumobilia is not excluded. There is some mild intra and extrahepatic biliary ductal dilatation. This could simply be reflective of benign post cholecystectomy physiology. However, if there is clinical concern for biliary tract obstruction, further evaluation with ERCP should be considered. 2. Simple (Bosniak class 1) cysts in the left kidney, as above. 3. Aortic atherosclerosis. Electronically Signed   By: Vinnie Langton M.D.   On: 03/14/2021 07:30      IMPRESSION:   Choledocholithiasis, recurrent.  Has had choledocholithiasis dating back to 2009 when she had 2 ERCPs.  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  Underwent extension of sphincterotomy, balloon dilation and balloon sweep/clearance of recurrent CBD stones.  W elevated WBCs, lactic acid of 2.6, reports of sweats but no documented fevers, can not r/O cholangitis.  Blood cultures in process.  Received 1 dose of cefepime and Flagyl early this morning  Chronic Coumadin.  Now on hold.  INR is 2.4.    PLAN:     ERCP. Will need INR corrected before proceeding.  In the meantime she can eat as tolerated so ordering clear liquids.    Should begin antibiotics, hx rash from pcn listed.  My understanding is the attending will be consulting pharmacy re abx.     Azucena Freed  03/14/2021, 9:42 AM Phone [phone]  I have reviewed the entire case in detail with the above APP and discussed the plan in detail.  Therefore, I agree with the diagnoses recorded above. In addition,  I have personally interviewed and examined  the patient and have personally reviewed any  abdominal/pelvic CT scan images.  My additional thoughts are as follows:  This patient is known to me from office follow-up after her hospitalization and procedures for choledocholithiasis earlier this year.  At that time she was seen by Dr. Ardis Hughs who did EUS and ERCP.  Stones and sludge removed in the bile duct after extending a prior sphincterotomy. She has return of similar symptoms, elevated LFTs and imaging consistent with choledocholithiasis.  Image interpretation made somewhat more challenging by air bubbles in the biliary tree from prior sphincterotomy.  Elevated and rising WBC raise concern for possible cholangitis.  However, patient does not appear septic, looks well with normal vital signs.  She has moderate epigastric and right upper quadrant tenderness.  Zosyn was started in the ED, then changed to cefepime.  I communicated with hospitalist earlier today and asked them to consider either change back to Zosyn or consideration of additional agent for enterococcal coverage, and vancomycin was added.  She needs an ERCP, which might also include additional sphincterotomy or sphincter dilation to aid with stone/sludge extraction.  INR is 2.4 today on Coumadin, that medicine has been held.  We are holding a slot for ERCP tomorrow in hopes that her INR will be improved enough to allow an ERCP.  If not, FFP may be needed preprocedure. I will discussed the case with my partner Dr. Carlean Purl who would be performing the ERCP. [name] is familiar with ERCP, having had it done in the past.  She was agreeable after discussion procedure and risks.  The benefits and risks of the planned procedure were described in detail with the patient or (when appropriate) their health care proxy.  Risks were outlined as including, but not limited to, bleeding, infection, perforation, adverse medication reaction leading to cardiac or pulmonary decompensation, pancreatitis (if ERCP).  The limitation of incomplete mucosal  visualization was also discussed.  No guarantees or warranties were given. Patient at increased risk for cardiopulmonary complications of procedure due to medical comorbidities.  Her husband was at the bedside, and all their questions were answered.   Nelida Meuse III Office:[phone]

## 2021-03-14 NOTE — Progress Notes (Signed)
Pharmacy Antibiotic Note  [name] is a 75 y.o. female admitted on 03/13/2021 with  intra-abdominal infection .  Pharmacy has been asked to add vancomycin for enterococcal coverage, in addition to ceftriaxone and metronidazole.  WBC elevated. SCr 1.28 > 1.15  Plan: -Vancomycin 1g IV q 36 hrs.  Calculated AUC 508. F/u cultures, renal function and clinical course.  Height: 4\' 11"  (149.9 cm) Weight: 90.3 kg (199 lb) IBW/kg (Calculated) : 43.2  Temp (24hrs), Avg:98.2 F (36.8 C), Min:97.8 F (36.6 C), Max:98.5 F (36.9 C)  Recent Labs  Lab 03/13/21 1834 03/13/21 2240 03/14/21 0419  WBC 13.1*  --  21.2*  CREATININE 1.28*  --  1.15*  LATICACIDVEN  --  2.6* 1.3     Estimated Creatinine Clearance: 42.6 mL/min (A) (by C-G formula based on SCr of 1.15 mg/dL (H)).    Allergies  Allergen Reactions   Chocolate Itching    Itching mouth   Beef-Derived Products Itching   Cat Hair Extract Itching   Erythromycin     Other reaction(s): Unknown   Fish Allergy Itching   Other Itching    white potatoes, corn   Phenergan [Promethazine] Other (See Comments)    Pt and family states her nausea worsens with phenergan rather than improving to the point she states she cant have it.    Pork-Derived Products Itching   Shellfish Allergy    Soybean Extract Allergy Skin Test Itching   Strawberry Extract Itching   Naproxen Sodium Other (See Comments)    Hurt all over   Penicillins Rash    Has patient had a PCN reaction causing immediate rash, facial/tongue/throat swelling, SOB or lightheadedness with hypotension: Yes Has patient had a PCN reaction causing severe rash involving mucus membranes or skin necrosis: Yes Has patient had a PCN reaction that required hospitalization: No Has patient had a PCN reaction occurring within the last 10 years: No If all of the above answers are "NO", then may proceed with Cephalosporin use.    Promethazine Hcl Other (See Comments)    Sick      Antimicrobials this admission: Cefepime 12/6 >> 12/7 Metronidazole 12/6 >>  Ceftriaxone 12/7 >  Vancomycin 12/7 >   Dose adjustments this admission:   Microbiology results: 12/6 BCx:    Thank you for allowing pharmacy to be a part of this patient's care.  Albertina Parr, PharmD., BCPS, BCCCP Clinical Pharmacist Please refer to [facility] for unit-specific pharmacist

## 2021-03-14 NOTE — Assessment & Plan Note (Signed)
   QTc interval noted to be 496, up from 472 several weeks ago  Considering patient is on flecainide therapy, will discuss other the slightly elevated QTC warrants adjustment of flecainide dosing with cardiology prior to discharge  Monitoring electrolytes closely and replacing as necessary  Monitoring on telemetry  Minimizing use of QT prolonging agent

## 2021-03-14 NOTE — Assessment & Plan Note (Signed)
.   Resume patients home regimen of oral antihypertensives . Titrate antihypertensive regimen as necessary to achieve adequate BP control . PRN intravenous antihypertensives for excessively elevated blood pressure   

## 2021-03-14 NOTE — Progress Notes (Signed)
ANTICOAGULATION CONSULT NOTE - Initial Consult  Pharmacy Consult for Coumadin Indication: atrial fibrillation  Allergies  Allergen Reactions   Chocolate Itching    Itching mouth   Erythromycin     Other reaction(s): Unknown   Other Itching    Strawberry , white potatoes, corn, soy, cat dander, fish   Phenergan [Promethazine] Other (See Comments)    Pt and family states her nausea worsens with phenergan rather than improving to the point she states she cant have it.    Shellfish Allergy    Naproxen Sodium Other (See Comments)    Hurt all over   Penicillins Rash    Has patient had a PCN reaction causing immediate rash, facial/tongue/throat swelling, SOB or lightheadedness with hypotension: Yes Has patient had a PCN reaction causing severe rash involving mucus membranes or skin necrosis: Yes Has patient had a PCN reaction that required hospitalization: No Has patient had a PCN reaction occurring within the last 10 years: No If all of the above answers are "NO", then may proceed with Cephalosporin use.    Promethazine Hcl Other (See Comments)    Sick     Patient Measurements: Height: 4\' 11"  (149.9 cm) Weight: 90.3 kg (199 lb) IBW/kg (Calculated) : 43.2  Vital Signs: Temp: 98.5 F (36.9 C) (12/06 2248) BP: 170/62 (12/06 2245) Pulse Rate: 84 (12/06 2245)  Labs: Recent Labs    03/13/21 1834 03/13/21 2034  HGB 12.2  --   HCT 37.7  --   PLT 222  --   LABPROT 23.6*  --   INR 2.1*  --   CREATININE 1.28*  --   TROPONINIHS 8 9    Estimated Creatinine Clearance: 38.3 mL/min (A) (by C-G formula based on SCr of 1.28 mg/dL (H)).   Medical History: Past Medical History:  Diagnosis Date   Borderline hyperlipidemia    Borderline hypertension    Complication of anesthesia    PONV   Difficult intubation    Hypertension    Hypothyroid    s/p PTU therapy   PAF (paroxysmal atrial fibrillation) (HCC)     Medications:  No current facility-administered medications on file  prior to encounter.   Current Outpatient Medications on File Prior to Encounter  Medication Sig Dispense Refill   flecainide (TAMBOCOR) 100 MG tablet Take 1 tablet (100 mg total) by mouth 2 (two) times daily. 180 tablet 3   omeprazole (PRILOSEC) 10 MG capsule Take 10 mg by mouth daily as needed (acid).      potassium chloride SA (KLOR-CON) 20 MEQ tablet Take 1 tablet (20 mEq total) by mouth 2 (two) times daily. 10 tablet 0   warfarin (COUMADIN) 5 MG tablet TAKE 1 to 2 TABLETS BY MOUTH ONCE DAILY OR AS  DIRECTED  BY  COUMADIN  CLINIC 90 tablet 1     Assessment: 75 y.o. female admitted with abdominal pain and N/V, h/o Afib, to continue Coumadin.  Current outpatient regimen Coumadin 5 mg daily.  Goal of Therapy:  INR 2-3 Monitor platelets by anticoagulation protocol: Yes   Plan:  F/U daily INR  Tenasia Aull, Bronson Curb 03/14/2021,12:16 AM

## 2021-03-14 NOTE — Consult Note (Addendum)
[facility] Consult: 9:42 AM 03/14/2021  LOS: 1 day    Referring Provider: Dr. Raelyn Mora Primary Care Physician:  Kelton Pillar, MD Primary Gastroenterologist:  Dr. Ardis Hughs    Reason for Consultation: Recurrent choledocholithiasis.   HPI: [name] is a 75 y.o. female.  PMH HTN.  HLD.  A. fib on chronic Coumadin.  PTU therapy to thyroid.  Cholecystectomy.  C-section.  Abdominal hysterectomy with unilateral oophorectomy for what sounds like fibroids. 2 separate ERCPs in 1999 following cholecystectomy. Seen as inpatient in April regarding upward trending T bili over 4 to 6 weeks. Hepatitis A IgM nonreactive.  Hepatitis B surface Ag nonreactive.  Hepatitis B core IgM nonreactive.  HCV antibody nonreactive. Ceruloplasmin normal.  IgG normal.  A1 AT elevated 240.  ANA positive with cytoplasmic pattern, 1:80 titer.  Mitochondrial antibody 20 which is in normal range.  IgG 1460 WBCs 11.3 >> 12.3 over 2 weeks INR is 6.2 today.  Hb 11.9. 05/11/2020 abdominal ultrasound: Gallbladder surgically absent.  3.9 mm CBD.  Normal liver.  Normal portal vein.  Normal spleen.  Cysts in the left kidney.  No explanation for abnormal LFTs. 05/25/2020 MRI abdomen/pelvis: Mild intrahepatic biliary ductal prominence.  Upper normal diameter of extrahepatic biliary duct.  No obstructing mass, lesion or choledocholithiasis.  6.7 mm nodule at head of pancreas/ampullary region.  PD normal.  Tiny foci in both kidneys, too small to characterize but likely benign. 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  EUS findings showed sludge, stone debris in the CBD.  No  pancreatic/ampullary nodule.  Normal pancreatic parenchyma but imaging limited by diverticulum containing food at distal duodenum, main PD normal, reactive looking periportal lymph node.  3 weeks nonradiating epigastric pain, sweats.Martin Majestic to ED 11/21 for this, labeled as "atypical chest pain: and was prescribed daily omeprazole.  No abdominal imaging performed.  Her potassium was 3.3.  LFTs not obtained.  Troponin I not elevated.  WBCs were 14.1.  INR 2.2. Since then, despite the omeprazole she has had intermittent epigastric discomfort.  Late yesterday morning the pain became very severe, associated with sweats and clear, nonbloody emesis, nausea.  She threw up and had persistent pain for several hours and finally early yesterday evening came to the ED.    T bili 5.8.  Alkaline phosphatase 611.  AST/ALT 185/115.  WBCs 21.2.  Hb 11.9.  Normal platelets.  Troponin I not elevated.  BUN normal, creatinine slightly elevated at 1.15.  Na 133. CTAP w contrast: Pneumobilia.  Sigmoid diverticulosis.  Nonobstructing left kidney stone.  Nothing acute or localizing to explain symptoms. MR abdomen/MRCP: Small filling defect, potentially choledocholithiasis, at the distal CBD.  Mild intra and extrahepatic biliary ductal dilatation.  Left kidney cysts.  Aortic atherosclerosis.  Patient lives with her husband.  Does not smoke or drink.  Past Medical History:  Diagnosis Date   Borderline hyperlipidemia    Borderline hypertension    Complication  of anesthesia    PONV   Difficult intubation    Hypertension    Hypothyroid    s/p PTU therapy   Hypothyroidism 09/15/2008   Qualifier: Diagnosis of  By: Percival Spanish, MD, Farrel Gordon     PAF (paroxysmal atrial fibrillation) [facility])     Past Surgical History:  Procedure Laterality Date   BILIARY DILATION  07/14/2020   Procedure: BILIARY DILATION;  Surgeon: Milus Banister, MD;  Location: [facility] ENDOSCOPY;  Service: Endoscopy;;   CESAREAN SECTION     CHOLECYSTECTOMY      ENDOSCOPIC RETROGRADE CHOLANGIOPANCREATOGRAPHY (ERCP) WITH PROPOFOL N/A 07/14/2020   Procedure: ENDOSCOPIC RETROGRADE CHOLANGIOPANCREATOGRAPHY (ERCP) WITH PROPOFOL;  Surgeon: Milus Banister, MD;  Location: [facility] ENDOSCOPY;  Service: Endoscopy;  Laterality: N/A;   ESOPHAGOGASTRODUODENOSCOPY (EGD) WITH PROPOFOL N/A 07/14/2020   Procedure: ESOPHAGOGASTRODUODENOSCOPY (EGD) WITH PROPOFOL;  Surgeon: Milus Banister, MD;  Location: [facility] ENDOSCOPY;  Service: Endoscopy;  Laterality: N/A;   KNEE SURGERY     right 2014, left 2015, arthroscipic   LEG SURGERY     Left leg d/t  tumor (benign)   REMOVAL OF STONES  07/14/2020   Procedure: REMOVAL OF STONES;  Surgeon: Milus Banister, MD;  Location: [facility] ENDOSCOPY;  Service: Endoscopy;;   SPHINCTEROTOMY  07/14/2020   Procedure: [name];  Surgeon: Milus Banister, MD;  Location: [place];  Service: Endoscopy;;   THYROID SURGERY     UPPER ESOPHAGEAL ENDOSCOPIC ULTRASOUND (EUS) N/A 07/14/2020   Procedure: UPPER ESOPHAGEAL ENDOSCOPIC ULTRASOUND (EUS);  Surgeon: Milus Banister, MD;  Location: [facility] ENDOSCOPY;  Service: Endoscopy;  Laterality: N/A;    Prior to Admission medications   Medication Sig Start Date End Date Taking? Authorizing Provider  flecainide (TAMBOCOR) 100 MG tablet Take 1 tablet (100 mg total) by mouth 2 (two) times daily. 08/04/20  Yes Minus Breeding, MD  omeprazole (PRILOSEC) 10 MG capsule Take 10 mg by mouth daily as needed (acid).    Yes [provider]  warfarin (COUMADIN) 5 MG tablet TAKE 1 to 2 TABLETS BY MOUTH ONCE DAILY OR AS  DIRECTED  BY  COUMADIN  CLINIC Patient taking differently: Take 5 mg by mouth daily. 10/23/20  Yes Minus Breeding, MD  potassium chloride SA (KLOR-CON) 20 MEQ tablet Take 1 tablet (20 mEq total) by mouth 2 (two) times daily. Patient not taking: Reported on 30/11/6576 46/96/29   Delora Fuel, MD    Scheduled Meds:  flecainide  100 mg Oral BID   pantoprazole (PROTONIX) IV  40 mg Intravenous Q24H    Infusions:  lactated ringers 100 mL/hr at 03/14/21 0406   PRN Meds: acetaminophen **OR** acetaminophen, hydrALAZINE, polyethylene glycol   Allergies as of 03/13/2021 - Review Complete 03/13/2021  Allergen Reaction Noted   Chocolate Itching 06/09/2019   Erythromycin  05/09/2020   Other Itching 07/12/2020   Phenergan [promethazine] Other (See Comments) 03/13/2021   Shellfish allergy  07/12/2020   Naproxen sodium Other (See Comments)    Penicillins Rash    Promethazine hcl Other (See Comments)     Family History  Problem Relation Age of Onset   Lung cancer Father    Breast cancer Mother        in 63's   Cirrhosis Brother    Diabetes Brother    Breast cancer Sister 54   Colon cancer Neg Hx    Colon polyps Neg Hx     Social History   Socioeconomic History   Marital status: Married    Spouse name: Not  on file   Number of children: Not on file   Years of education: Not on file   Highest education level: Not on file  Occupational History   Not on file  Tobacco Use   Smoking status: Former    Packs/day: 0.80    Years: 5.00    Pack years: 4.00    Types: Cigarettes    Quit date: 04/08/1982    Years since quitting: 38.9   Smokeless tobacco: Never  Vaping Use   Vaping Use: Never used  Substance and Sexual Activity   Alcohol use: No   Drug use: No   Sexual activity: Not on file  Other Topics Concern   Not on file  Social History Narrative   Not on file   Social Determinants of Health   Financial Resource Strain: Not on file  Food Insecurity: Not on file  Transportation Needs: Not on file  Physical Activity: Not on file  Stress: Not on file  Social Connections: Not on file  Intimate Partner Violence: Not on file    REVIEW OF SYSTEMS: Constitutional: Describes orthostatic hypotension for many weeks.  Has not had syncope. ENT:  No nose bleeds Pulm: No shortness of breath or cough CV:  No palpitations, no LE edema.  No angina. GU: Urine has been dark  intermittently for the past 3 weeks.  No hematuria, no frequency GI: See HPI. Heme: Develops purpura and small bruises fairly easily but no excessive bleeding. Transfusions: Received platelets in April 2022 Neuro: Orthostatic dizziness as above.  No headaches, no peripheral tingling or numbness Derm:  No itching, no rash or sores.  Endocrine:  No sweats or chills.  No polyuria or dysuria Immunization: Reviewed. Travel:  None beyond local counties in last few months.    PHYSICAL EXAM: Vital signs in last 24 hours: Vitals:   03/14/21 0800 03/14/21 0900  BP: (!) 156/57 (!) 165/57  Pulse: (!) 54 (!) 54  Resp: 13 20  Temp:    SpO2: 96% 97%   Wt Readings from Last 3 Encounters:  03/13/21 90.3 kg  11/17/20 98.9 kg  08/31/20 95.7 kg    General: Patient is icteric.  Resting comfortably and alert/awake on the stretcher. Head: No facial asymmetry or swelling.  No signs of head trauma. Eyes: Slight scleral icterus Ears: Not hard of hearing Nose: No congestion or discharge Mouth: Dentures in place on top.  She not removed for exam.  She is edentulous.  Tongue midline.  Mucosa moist, pink, clear. Neck: No JVD, no masses, no thyromegaly Lungs: Clear bilaterally without labored breathing or cough. Heart: RRR.  No MRG.  S1, S2 present Abdomen: Soft without distention.  Tender diffusely but more so in the epigastric area.  No guarding or rebound.  No HSM, masses, bruits, hernias.   Rectal: Deferred Musc/Skeltl: No joint redness, swelling or gross deformity. Extremities: No CCE. Neurologic: Alert.  Appropriate.  Oriented x3.  Able to provide good history.  Moves all 4 limbs without tremor, strength not tested. Skin: No obvious jaundice. Tattoos: None Nodes: No cervical adenopathy Psych: Calm, pleasant, cooperative.  Intake/Output from previous day: 12/06 0701 - 12/07 0700 In: 2000 [IV Piggyback:2000] Out: -  Intake/Output this shift: No intake/output data recorded.  LAB  RESULTS: Recent Labs    03/13/21 1834 03/14/21 0419  WBC 13.1* 21.2*  HGB 12.2 11.9*  HCT 37.7 35.3*  PLT 222 250   BMET Lab Results  Component Value Date   NA 133 (L) 03/14/2021  NA 135 03/13/2021   NA 138 02/26/2021   K 4.3 03/14/2021   K 4.3 03/13/2021   K 3.3 (L) 02/26/2021   CL 101 03/14/2021   CL 100 03/13/2021   CL 101 02/26/2021   CO2 23 03/14/2021   CO2 25 03/13/2021   CO2 27 02/26/2021   GLUCOSE 105 (H) 03/14/2021   GLUCOSE 124 (H) 03/13/2021   GLUCOSE 122 (H) 02/26/2021   BUN 23 03/14/2021   BUN 26 (H) 03/13/2021   BUN 17 02/26/2021   CREATININE 1.15 (H) 03/14/2021   CREATININE 1.28 (H) 03/13/2021   CREATININE 1.10 (H) 02/26/2021   CALCIUM 7.9 (L) 03/14/2021   CALCIUM 8.6 (L) 03/13/2021   CALCIUM 8.6 (L) 02/26/2021   LFT Recent Labs    03/13/21 1834 03/14/21 0419  PROT 7.2 6.2*  ALBUMIN 2.8* 2.4*  AST 205* 185*  ALT 122* 115*  ALKPHOS 642* 611*  BILITOT 5.8* 5.8*   PT/INR Lab Results  Component Value Date   INR 2.4 (H) 03/14/2021   INR 2.1 (H) 03/13/2021   INR 2.2 (H) 02/26/2021   Hepatitis Panel No results for input(s): HEPBSAG, HCVAB, HEPAIGM, HEPBIGM in the last 72 hours. C-Diff No components found for: CDIFF Lipase     Component Value Date/Time   LIPASE 30 03/13/2021 1834    Drugs of Abuse  No results found for: LABOPIA, COCAINSCRNUR, LABBENZ, AMPHETMU, THCU, LABBARB   RADIOLOGY STUDIES: CT ABDOMEN PELVIS W CONTRAST  Result Date: 03/13/2021 CLINICAL DATA:  Abdominal distension. EXAM: CT ABDOMEN AND PELVIS WITH CONTRAST TECHNIQUE: Multidetector CT imaging of the abdomen and pelvis was performed using the standard protocol following bolus administration of intravenous contrast. CONTRAST:  53mL OMNIPAQUE IOHEXOL 300 MG/ML  SOLN COMPARISON:  CT abdomen and pelvis 12/10/2005. FINDINGS: Lower chest: No acute abnormality. Hepatobiliary: Status post cholecystectomy. There is no biliary ductal dilatation. Pneumobilia has increased when  compared to the prior study. There is no focal liver lesion identified. Pancreas: Fatty replaced, unchanged. Spleen: Normal in size without focal abnormality. Adrenals/Urinary Tract: Left renal cysts are present measuring up to 2.6 cm. These are new from the prior examination. There is a nonobstructing 5 mm left renal calculus. There is no hydronephrosis or perinephric stranding. Adrenal glands and bladder are within normal limits. Stomach/Bowel: Stomach is within normal limits. no evidence of bowel wall thickening, distention, or inflammatory changes. There is sigmoid colon diverticulosis without evidence for diverticulitis. The appendix is not seen. Vascular/Lymphatic: Aortic atherosclerosis. No enlarged abdominal or pelvic lymph nodes. Reproductive: Status post hysterectomy. No adnexal masses. Other: No abdominal wall hernia or abnormality. No abdominopelvic ascites. Musculoskeletal: No acute or significant osseous findings. IMPRESSION: 1.  No acute localizing process in the abdomen or pelvis. 2. Sigmoid colon diverticulosis without evidence for diverticulitis. 3. Cholecystectomy. Pneumobilia is present likely related to prior sphincterotomy. Please correlate clinically. 4.  Aortic Atherosclerosis (ICD10-I70.0). 5.  Nonobstructing left renal calculus.  New left renal cysts. Electronically Signed   By: Ronney Asters M.D.   On: 03/13/2021 21:28   DG Chest Port 1 View  Result Date: 03/13/2021 CLINICAL DATA:  Nausea and vomiting EXAM: PORTABLE CHEST 1 VIEW COMPARISON:  02/26/2021 FINDINGS: The heart size and mediastinal contours are within normal limits. Both lungs are clear. The visualized skeletal structures are unremarkable. IMPRESSION: No active disease. Electronically Signed   By: Inez Catalina M.D.   On: 03/13/2021 19:07   MR ABDOMEN MRCP W WO CONTAST  Result Date: 03/14/2021 CLINICAL DATA:  75 year old female with history  of abdominal pain. Evaluate for biliary tract obstruction. EXAM: MRI ABDOMEN  WITHOUT AND WITH CONTRAST (INCLUDING MRCP) TECHNIQUE: Multiplanar multisequence MR imaging of the abdomen was performed both before and after the administration of intravenous contrast. Heavily T2-weighted images of the biliary and pancreatic ducts were obtained, and three-dimensional MRCP images were rendered by post processing. CONTRAST:  56mL GADAVIST GADOBUTROL 1 MMOL/ML IV SOLN COMPARISON:  Abdominal MRI 08/09/2020. CT the abdomen and pelvis 03/13/2021. FINDINGS: Lower chest: Unremarkable. Hepatobiliary: No suspicious cystic or solid hepatic lesions. Status post cholecystectomy. MRCP images demonstrate very mild intra and extrahepatic biliary ductal dilatation. Common bile duct measures 8 mm in the porta hepatis, which may be within normal limits for this post cholecystectomy patient. There is a small filling defect in the distal common bile duct (coronal MRCP image 20 of series 14) measuring 5 mm. Several other signal voids are noted within the intrahepatic biliary tree, likely reflective of pneumobilia given the findings on recent CT examination. Pancreas: No pancreatic mass. No pancreatic ductal dilatation noted on MRCP images. No pancreatic or peripancreatic fluid collections or inflammatory changes. Spleen:  Unremarkable. Adrenals/Urinary Tract: T1 hypointense, T2 hyperintense, nonenhancing lesions in the left kidney, compatible with simple cysts, largest of which measures 3.5 cm in the lower pole. Right kidney and bilateral adrenal glands are normal in appearance. No hydroureteronephrosis in the visualized portions of the abdomen. Stomach/Bowel: Visualized portions are unremarkable. Vascular/Lymphatic: Aortic atherosclerosis, without evidence of aneurysm in the visualized abdominal vasculature. No lymphadenopathy noted in the abdomen. Other: No significant volume of ascites noted in the visualized portions of the peritoneal cavity. Musculoskeletal: No aggressive appearing osseous lesions are noted in the  visualized portions of the skeleton. IMPRESSION: 1. There is a small filling defect in the distal common bile duct suspicious for potential choledocholithiasis. However, given the presence of pneumobilia on the recent CT examination, the possibility that this filling defect is susceptibility artifact from pneumobilia is not excluded. There is some mild intra and extrahepatic biliary ductal dilatation. This could simply be reflective of benign post cholecystectomy physiology. However, if there is clinical concern for biliary tract obstruction, further evaluation with ERCP should be considered. 2. Simple (Bosniak class 1) cysts in the left kidney, as above. 3. Aortic atherosclerosis. Electronically Signed   By: Vinnie Langton M.D.   On: 03/14/2021 07:30      IMPRESSION:   Choledocholithiasis, recurrent.  Has had choledocholithiasis dating back to 2009 when she had 2 ERCPs.  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  Underwent extension of sphincterotomy, balloon dilation and balloon sweep/clearance of recurrent CBD stones.  W elevated WBCs, lactic acid of 2.6, reports of sweats but no documented fevers, can not r/O cholangitis.  Blood cultures in process.  Received 1 dose of cefepime and Flagyl early this morning  Chronic Coumadin.  Now on hold.  INR is 2.4.    PLAN:     ERCP. Will need INR corrected before proceeding.  In the meantime she can eat as tolerated so ordering clear liquids.    Should begin antibiotics, hx rash from pcn listed.  My understanding is the attending will be consulting pharmacy re abx.     Azucena Freed  03/14/2021, 9:42 AM Phone [phone]  I have reviewed the entire case in detail with the above APP and discussed the plan in detail.  Therefore, I agree with the diagnoses recorded above. In addition,  I have personally interviewed and examined  the patient and have personally reviewed any  abdominal/pelvic CT scan images.  My additional thoughts are as follows:  This patient is known to me from office follow-up after her hospitalization and procedures for choledocholithiasis earlier this year.  At that time she was seen by Dr. Ardis Hughs who did EUS and ERCP.  Stones and sludge removed in the bile duct after extending a prior sphincterotomy. She has return of similar symptoms, elevated LFTs and imaging consistent with choledocholithiasis.  Image interpretation made somewhat more challenging by air bubbles in the biliary tree from prior sphincterotomy.  Elevated and rising WBC raise concern for possible cholangitis.  However, patient does not appear septic, looks well with normal vital signs.  She has moderate epigastric and right upper quadrant tenderness.  Zosyn was started in the ED, then changed to cefepime.  I communicated with hospitalist earlier today and asked them to consider either change back to Zosyn or consideration of additional agent for enterococcal coverage, and vancomycin was added.  She needs an ERCP, which might also include additional sphincterotomy or sphincter dilation to aid with stone/sludge extraction.  INR is 2.4 today on Coumadin, that medicine has been held.  We are holding a slot for ERCP tomorrow in hopes that her INR will be improved enough to allow an ERCP.  If not, FFP may be needed preprocedure. I will discussed the case with my partner Dr. Carlean Purl who would be performing the ERCP. [name] is familiar with ERCP, having had it done in the past.  She was agreeable after discussion procedure and risks.  The benefits and risks of the planned procedure were described in detail with the patient or (when appropriate) their health care proxy.  Risks were outlined as including, but not limited to, bleeding, infection, perforation, adverse medication reaction leading to cardiac or pulmonary decompensation, pancreatitis (if ERCP).  The limitation of incomplete mucosal  visualization was also discussed.  No guarantees or warranties were given. Patient at increased risk for cardiopulmonary complications of procedure due to medical comorbidities.  Her husband was at the bedside, and all their questions were answered.   Nelida Meuse III Office:[phone]

## 2021-03-14 NOTE — ED Notes (Signed)
Transported to MRI

## 2021-03-14 NOTE — Assessment & Plan Note (Signed)
   Currently rate controlled on flecainide  Review of EKG reveals slightly elevated QTc, slight increase compared to previous EKG in the system.  May be beneficial to discuss any adjustments to [name] therapy with cardiology prior to discharge  Temporarily holding Coumadin in preparation for ERCP and any potential biopsies   INR will be repeated in the morning and if above 2.0 can consider administration of correction prior to procedure  Monitoring patient on telemetry

## 2021-03-14 NOTE — ED Notes (Signed)
Received verbal report from Joshua N RN at this time 

## 2021-03-14 NOTE — Assessment & Plan Note (Signed)
   Patient presenting with epigastric pain nausea and vomiting after eating breakfast  Patient reports having similar episodes that been happening approximately once weekly although patient describes this episode is the worst she has ever had  Abnormal LFTs concerning for hepatobiliary obstruction such as choledocholithiasis  CT imaging however did not reveal a definitive stone however patient is being treated as such due to high clinical suspicion  ER provider has already discussed case with Dr. Silverio Decamp with gastroenterology who will ensure that gastroenterology sees patient in consultation in the morning.  Dr. Silverio Decamp is requesting an MRCP be performed and that patient be provided with empiric intravenous antibiotic therapy considering concurrent leukocytosis.  MRCP ordered per gastroenterology recommendation  Cefepime and Flagyl initiated.  Gentle intravenous hydration  As needed antiemetics and analgesics  N.p.o. in case of GI intervention

## 2021-03-14 NOTE — Progress Notes (Signed)
PROGRESS NOTE    [name]  OMV:[id number] DOB: 1946-01-04 DOA: 03/13/2021 PCP: Kelton Pillar, MD    Brief Narrative:  75 year old female with history of paroxysmal A. fib on Coumadin and flecainide, diastolic heart failure, hypothyroidism, hypertension and history of choledocholithiasis status postcholecystectomy presented to the hospital with epigastric pain, nausea and vomiting for 1 day.  In the emergency room hemodynamically stable.  She was found to have significant elevated bilirubin of 5.8 with AST/ALT 205/122.  CT scan with no obvious abnormalities of biliary duct.  Pneumobilia present.  No other acute disease found.  GI consulted.  Started on IV antibiotics.  MRCP abnormal.   Assessment & Plan:   Principal Problem:   Choledocholithiasis Active Problems:   Hypothyroidism   Essential hypertension   AF (paroxysmal atrial fibrillation) (HCC)   Prolonged QT interval  Obstructive jaundice/nausea vomiting and abdominal pain/suspected choledocholithiasis: Stabilizing.  Clear liquid diet.  N.p.o. past midnight. IV fluids Adequate IV opiates for pain relief Antibiotics indicated due to acute pain and elevated WBC count.  On cefepime and Flagyl.  Added vancomycin for Enterococcus coverage. MRCP consistent with filling defect in the biliary tree.  Scheduled for ERCP tomorrow by GI.  Paroxysmal A. fib on Coumadin: Sinus rhythm on flecainide.  Continued.  Electrolytes are adequate. Patient on Coumadin with INR therapeutic.  Anticipate ERCP, discontinue Coumadin.   No need for bridging.  Since patient has taken Coumadin last night, will give 5 mg of vitamin K to reverse Coumadin in anticipation of sphincterotomy tomorrow. QTC is 496, on flecainide 100 mg twice a day.  Will discuss with her cardiologist about dosing.  Chronic diastolic dysfunction/hypertension/hyperlipidemia: Chronic and stable. Euvolemic. Antihypertensives on hold Statin on hold.     DVT prophylaxis:  Place and maintain sequential compression device Start: 03/14/21 0820  Therapeutic on Coumadin   Code Status: Full code Family Communication: Husband at the bedside Disposition Plan: Status is: Inpatient  Remains inpatient appropriate because: Severe presentation.  Inpatient procedures.         Consultants:  Gastroenterology  Procedures:  None  Antimicrobials:  Cefepime and Flagyl , vancomycin 12/6---   Subjective: Patient seen and examined in the morning rounds.  Without any nausea vomiting.  Abdominal pain has improved.  Objective: Vitals:   03/14/21 1100 03/14/21 1200 03/14/21 1300 03/14/21 1400  BP: (!) 143/58 (!) 137/59 (!) 125/52 (!) 118/59  Pulse: (!) 51 (!) 50 (!) 51 (!) 55  Resp: 18 15 18 16   Temp:      SpO2: 94% 96% 94% 93%  Weight:      Height:        Intake/Output Summary (Last 24 hours) at 03/14/2021 1434 Last data filed at 03/14/2021 0406 Gross per 24 hour  Intake 2000 ml  Output --  Net 2000 ml   Filed Weights   03/13/21 1837  Weight: 90.3 kg    Examination:  General exam: Appears calm and comfortable  Respiratory system: Clear to auscultation. Respiratory effort normal. Cardiovascular system: S1 & S2 heard, RRR.  Gastrointestinal system: Soft.  Mild tenderness along the epigastrium.  Bowel sound present.   Central nervous system: Alert and oriented. No focal neurological deficits. Extremities: Symmetric 5 x 5 power. Skin: No rashes, lesions or ulcers Psychiatry: Judgement and insight appear normal. Mood & affect appropriate.     Data Reviewed: I have personally reviewed following labs and imaging studies  CBC: Recent Labs  Lab 03/13/21 1834 03/14/21 0419  WBC 13.1* 21.2*  NEUTROABS 10.9*  18.3*  HGB 12.2 11.9*  HCT 37.7 35.3*  MCV 92.0 89.8  PLT 222 001   Basic Metabolic Panel: Recent Labs  Lab 03/13/21 1834 03/14/21 0000 03/14/21 0419  NA 135  --  133*  K 4.3  --  4.3  CL 100  --  101  CO2 25  --  23  GLUCOSE 124*   --  105*  BUN 26*  --  23  CREATININE 1.28*  --  1.15*  CALCIUM 8.6*  --  7.9*  MG  --  1.8 1.8   GFR: Estimated Creatinine Clearance: 42.6 mL/min (A) (by C-G formula based on SCr of 1.15 mg/dL (H)). Liver Function Tests: Recent Labs  Lab 03/13/21 1834 03/14/21 0419  AST 205* 185*  ALT 122* 115*  ALKPHOS 642* 611*  BILITOT 5.8* 5.8*  PROT 7.2 6.2*  ALBUMIN 2.8* 2.4*   Recent Labs  Lab 03/13/21 1834  LIPASE 30   No results for input(s): AMMONIA in the last 168 hours. Coagulation Profile: Recent Labs  Lab 03/13/21 1834 03/14/21 0419  INR 2.1* 2.4*   Cardiac Enzymes: No results for input(s): CKTOTAL, CKMB, CKMBINDEX, TROPONINI in the last 168 hours. BNP (last 3 results) No results for input(s): PROBNP in the last 8760 hours. HbA1C: No results for input(s): HGBA1C in the last 72 hours. CBG: No results for input(s): GLUCAP in the last 168 hours. Lipid Profile: No results for input(s): CHOL, HDL, LDLCALC, TRIG, CHOLHDL, LDLDIRECT in the last 72 hours. Thyroid Function Tests: No results for input(s): TSH, T4TOTAL, FREET4, T3FREE, THYROIDAB in the last 72 hours. Anemia Panel: No results for input(s): VITAMINB12, FOLATE, FERRITIN, TIBC, IRON, RETICCTPCT in the last 72 hours. Sepsis Labs: Recent Labs  Lab 03/13/21 2240 03/14/21 0419  LATICACIDVEN 2.6* 1.3    Recent Results (from the past 240 hour(s))  Resp Panel by RT-PCR (Flu A&B, Covid) Nasopharyngeal Swab     Status: None   Collection Time: 03/13/21 10:25 PM   Specimen: Nasopharyngeal Swab; Nasopharyngeal(NP) swabs in vial transport medium  Result Value Ref Range Status   SARS Coronavirus 2 by RT PCR NEGATIVE NEGATIVE Final    Comment: (NOTE) SARS-CoV-2 target nucleic acids are NOT DETECTED.  The SARS-CoV-2 RNA is generally detectable in upper respiratory specimens during the acute phase of infection. The lowest concentration of SARS-CoV-2 viral copies this assay can detect is 138 copies/mL. A negative  result does not preclude SARS-Cov-2 infection and should not be used as the sole basis for treatment or other patient management decisions. A negative result may occur with  improper specimen collection/handling, submission of specimen other than nasopharyngeal swab, presence of viral mutation(s) within the areas targeted by this assay, and inadequate number of viral copies(<138 copies/mL). A negative result must be combined with clinical observations, patient history, and epidemiological information. The expected result is Negative.  Fact Sheet for Patients:  EntrepreneurPulse.com.au  Fact Sheet for Healthcare Providers:  IncredibleEmployment.be  This test is no t yet approved or cleared by the [place] FDA and  has been authorized for detection and/or diagnosis of SARS-CoV-2 by FDA under an Emergency Use Authorization (EUA). This EUA will remain  in effect (meaning this test can be used) for the duration of the COVID-19 declaration under Section 564(b)(1) of the Act, 21 U.S.C.section 360bbb-3(b)(1), unless the authorization is terminated  or revoked sooner.       Influenza A by PCR NEGATIVE NEGATIVE Final   Influenza B by PCR NEGATIVE NEGATIVE Final  Comment: (NOTE) The Xpert Xpress SARS-CoV-2/FLU/RSV plus assay is intended as an aid in the diagnosis of influenza from Nasopharyngeal swab specimens and should not be used as a sole basis for treatment. Nasal washings and aspirates are unacceptable for Xpert Xpress SARS-CoV-2/FLU/RSV testing.  Fact Sheet for Patients: EntrepreneurPulse.com.au  Fact Sheet for Healthcare Providers: IncredibleEmployment.be  This test is not yet approved or cleared by the [place] FDA and has been authorized for detection and/or diagnosis of SARS-CoV-2 by FDA under an Emergency Use Authorization (EUA). This EUA will remain in effect (meaning this test can be used)  for the duration of the COVID-19 declaration under Section 564(b)(1) of the Act, 21 U.S.C. section 360bbb-3(b)(1), unless the authorization is terminated or revoked.  Performed at [facility], [place] [address] [place], [place] [address]          Radiology Studies: CT ABDOMEN PELVIS W CONTRAST  Result Date: 03/13/2021 CLINICAL DATA:  Abdominal distension. EXAM: CT ABDOMEN AND PELVIS WITH CONTRAST TECHNIQUE: Multidetector CT imaging of the abdomen and pelvis was performed using the standard protocol following bolus administration of intravenous contrast. CONTRAST:  41mL OMNIPAQUE IOHEXOL 300 MG/ML  SOLN COMPARISON:  CT abdomen and pelvis 12/10/2005. FINDINGS: Lower chest: No acute abnormality. Hepatobiliary: Status post cholecystectomy. There is no biliary ductal dilatation. Pneumobilia has increased when compared to the prior study. There is no focal liver lesion identified. Pancreas: Fatty replaced, unchanged. Spleen: Normal in size without focal abnormality. Adrenals/Urinary Tract: Left renal cysts are present measuring up to 2.6 cm. These are new from the prior examination. There is a nonobstructing 5 mm left renal calculus. There is no hydronephrosis or perinephric stranding. Adrenal glands and bladder are within normal limits. Stomach/Bowel: Stomach is within normal limits. no evidence of bowel wall thickening, distention, or inflammatory changes. There is sigmoid colon diverticulosis without evidence for diverticulitis. The appendix is not seen. Vascular/Lymphatic: Aortic atherosclerosis. No enlarged abdominal or pelvic lymph nodes. Reproductive: Status post hysterectomy. No adnexal masses. Other: No abdominal wall hernia or abnormality. No abdominopelvic ascites. Musculoskeletal: No acute or significant osseous findings. IMPRESSION: 1.  No acute localizing process in the abdomen or pelvis. 2. Sigmoid colon diverticulosis without evidence for diverticulitis. 3. Cholecystectomy.  Pneumobilia is present likely related to prior sphincterotomy. Please correlate clinically. 4.  Aortic Atherosclerosis (ICD10-I70.0). 5.  Nonobstructing left renal calculus.  New left renal cysts. Electronically Signed   By: Ronney Asters M.D.   On: 03/13/2021 21:28   DG Chest Port 1 View  Result Date: 03/13/2021 CLINICAL DATA:  Nausea and vomiting EXAM: PORTABLE CHEST 1 VIEW COMPARISON:  02/26/2021 FINDINGS: The heart size and mediastinal contours are within normal limits. Both lungs are clear. The visualized skeletal structures are unremarkable. IMPRESSION: No active disease. Electronically Signed   By: Inez Catalina M.D.   On: 03/13/2021 19:07   MR ABDOMEN MRCP W WO CONTAST  Result Date: 03/14/2021 CLINICAL DATA:  75 year old female with history of abdominal pain. Evaluate for biliary tract obstruction. EXAM: MRI ABDOMEN WITHOUT AND WITH CONTRAST (INCLUDING MRCP) TECHNIQUE: Multiplanar multisequence MR imaging of the abdomen was performed both before and after the administration of intravenous contrast. Heavily T2-weighted images of the biliary and pancreatic ducts were obtained, and three-dimensional MRCP images were rendered by post processing. CONTRAST:  37mL GADAVIST GADOBUTROL 1 MMOL/ML IV SOLN COMPARISON:  Abdominal MRI 08/09/2020. CT the abdomen and pelvis 03/13/2021. FINDINGS: Lower chest: Unremarkable. Hepatobiliary: No suspicious cystic or solid hepatic lesions. Status post cholecystectomy. MRCP images demonstrate  very mild intra and extrahepatic biliary ductal dilatation. Common bile duct measures 8 mm in the porta hepatis, which may be within normal limits for this post cholecystectomy patient. There is a small filling defect in the distal common bile duct (coronal MRCP image 20 of series 14) measuring 5 mm. Several other signal voids are noted within the intrahepatic biliary tree, likely reflective of pneumobilia given the findings on recent CT examination. Pancreas: No pancreatic mass. No  pancreatic ductal dilatation noted on MRCP images. No pancreatic or peripancreatic fluid collections or inflammatory changes. Spleen:  Unremarkable. Adrenals/Urinary Tract: T1 hypointense, T2 hyperintense, nonenhancing lesions in the left kidney, compatible with simple cysts, largest of which measures 3.5 cm in the lower pole. Right kidney and bilateral adrenal glands are normal in appearance. No hydroureteronephrosis in the visualized portions of the abdomen. Stomach/Bowel: Visualized portions are unremarkable. Vascular/Lymphatic: Aortic atherosclerosis, without evidence of aneurysm in the visualized abdominal vasculature. No lymphadenopathy noted in the abdomen. Other: No significant volume of ascites noted in the visualized portions of the peritoneal cavity. Musculoskeletal: No aggressive appearing osseous lesions are noted in the visualized portions of the skeleton. IMPRESSION: 1. There is a small filling defect in the distal common bile duct suspicious for potential choledocholithiasis. However, given the presence of pneumobilia on the recent CT examination, the possibility that this filling defect is susceptibility artifact from pneumobilia is not excluded. There is some mild intra and extrahepatic biliary ductal dilatation. This could simply be reflective of benign post cholecystectomy physiology. However, if there is clinical concern for biliary tract obstruction, further evaluation with ERCP should be considered. 2. Simple (Bosniak class 1) cysts in the left kidney, as above. 3. Aortic atherosclerosis. Electronically Signed   By: Vinnie Langton M.D.   On: 03/14/2021 07:30        Scheduled Meds:  pantoprazole (PROTONIX) IV  40 mg Intravenous Q24H   Continuous Infusions:  cefTRIAXone (ROCEPHIN)  IV Stopped (03/14/21 1110)   lactated ringers 100 mL/hr at 03/14/21 0406   metronidazole Stopped (03/14/21 1138)   vancomycin Stopped (03/14/21 1352)     LOS: 1 day    Time spent: 35  minutes    Barb Merino, MD [facility] Pager [phone]

## 2021-03-14 NOTE — ED Notes (Signed)
Patient denies pain and is resting comfortably.  

## 2021-03-14 NOTE — ED Notes (Signed)
Remains in MRI at this time 

## 2021-03-14 NOTE — Assessment & Plan Note (Signed)
   Documented history of hypothyroidism however patient is no longer on Synthroid\  TSH obtained several months ago was 2.1

## 2021-03-15 ENCOUNTER — Encounter (HOSPITAL_COMMUNITY): Payer: Self-pay | Admitting: Internal Medicine

## 2021-03-15 ENCOUNTER — Inpatient Hospital Stay (HOSPITAL_COMMUNITY): Payer: Medicare PPO

## 2021-03-15 ENCOUNTER — Inpatient Hospital Stay (HOSPITAL_COMMUNITY): Payer: Medicare PPO | Admitting: Certified Registered Nurse Anesthetist

## 2021-03-15 ENCOUNTER — Encounter (HOSPITAL_COMMUNITY)
Admission: EM | Disposition: A | Discharge status: Home / Self Care | Payer: Self-pay | Source: Home / Self Care | Attending: Internal Medicine

## 2021-03-15 DIAGNOSIS — K8050 Calculus of bile duct without cholangitis or cholecystitis without obstruction: Secondary | ICD-10-CM | POA: Diagnosis not present

## 2021-03-15 DIAGNOSIS — R933 Abnormal findings on diagnostic imaging of other parts of digestive tract: Secondary | ICD-10-CM

## 2021-03-15 DIAGNOSIS — K838 Other specified diseases of biliary tract: Secondary | ICD-10-CM

## 2021-03-15 DIAGNOSIS — R17 Unspecified jaundice: Secondary | ICD-10-CM

## 2021-03-15 HISTORY — PX: ENDOSCOPIC RETROGRADE CHOLANGIOPANCREATOGRAPHY (ERCP) WITH PROPOFOL: SHX5810

## 2021-03-15 LAB — COMPREHENSIVE METABOLIC PANEL
ALT: 97 U/L — ABNORMAL HIGH (ref 0–44)
AST: 137 U/L — ABNORMAL HIGH (ref 15–41)
Albumin: 2.3 g/dL — ABNORMAL LOW (ref 3.5–5.0)
Alkaline Phosphatase: 524 U/L — ABNORMAL HIGH (ref 38–126)
Anion gap: 10 (ref 5–15)
BUN: 14 mg/dL (ref 8–23)
CO2: 24 mmol/L (ref 22–32)
Calcium: 8.6 mg/dL — ABNORMAL LOW (ref 8.9–10.3)
Chloride: 103 mmol/L (ref 98–111)
Creatinine, Ser: 1.27 mg/dL — ABNORMAL HIGH (ref 0.44–1.00)
GFR, Estimated: 45 mL/min — ABNORMAL LOW (ref >60)
Glucose, Bld: 89 mg/dL (ref 70–99)
Potassium: 3.9 mmol/L (ref 3.5–5.1)
Sodium: 137 mmol/L (ref 135–145)
Total Bilirubin: 5.1 mg/dL — ABNORMAL HIGH (ref 0.3–1.2)
Total Protein: 6 g/dL — ABNORMAL LOW (ref 6.5–8.1)

## 2021-03-15 LAB — CBC WITH DIFFERENTIAL/PLATELET
Abs Immature Granulocytes: 0.05 10*3/uL (ref 0.00–0.07)
Basophils Absolute: 0.1 10*3/uL (ref 0.0–0.1)
Basophils Relative: 1 %
Eosinophils Absolute: 0.1 10*3/uL (ref 0.0–0.5)
Eosinophils Relative: 1 %
HCT: 34.4 % — ABNORMAL LOW (ref 36.0–46.0)
Hemoglobin: 11.2 g/dL — ABNORMAL LOW (ref 12.0–15.0)
Immature Granulocytes: 0 %
Lymphocytes Relative: 16 %
Lymphs Abs: 1.9 10*3/uL (ref 0.7–4.0)
MCH: 29.7 pg (ref 26.0–34.0)
MCHC: 32.6 g/dL (ref 30.0–36.0)
MCV: 91.2 fL (ref 80.0–100.0)
Monocytes Absolute: 1.3 10*3/uL — ABNORMAL HIGH (ref 0.1–1.0)
Monocytes Relative: 11 %
Neutro Abs: 8.6 10*3/uL — ABNORMAL HIGH (ref 1.7–7.7)
Neutrophils Relative %: 71 %
Platelets: 223 10*3/uL (ref 150–400)
RBC: 3.77 MIL/uL — ABNORMAL LOW (ref 3.87–5.11)
RDW: 15.6 % — ABNORMAL HIGH (ref 11.5–15.5)
WBC: 12.1 10*3/uL — ABNORMAL HIGH (ref 4.0–10.5)
nRBC: 0 % (ref 0.0–0.2)

## 2021-03-15 LAB — PROTIME-INR
INR: 2.5 — ABNORMAL HIGH (ref 0.8–1.2)
Prothrombin Time: 26.8 seconds — ABNORMAL HIGH (ref 11.4–15.2)

## 2021-03-15 LAB — PHOSPHORUS: Phosphorus: 3.2 mg/dL (ref 2.5–4.6)

## 2021-03-15 LAB — MAGNESIUM: Magnesium: 1.9 mg/dL (ref 1.7–2.4)

## 2021-03-15 SURGERY — ENDOSCOPIC RETROGRADE CHOLANGIOPANCREATOGRAPHY (ERCP) WITH PROPOFOL
Anesthesia: General

## 2021-03-15 MED ORDER — INDOMETHACIN 50 MG RE SUPP
RECTAL | Status: AC
  Filled 2021-03-15: qty 1

## 2021-03-15 MED ORDER — WARFARIN SODIUM 2.5 MG PO TABS
2.5000 mg | ORAL_TABLET | Freq: Once | ORAL | Status: AC
  Administered 2021-03-15: 2.5 mg via ORAL
  Filled 2021-03-15: qty 1

## 2021-03-15 MED ORDER — SUGAMMADEX SODIUM 200 MG/2ML IV SOLN
INTRAVENOUS | Status: DC | PRN
  Administered 2021-03-15: 200 mg via INTRAVENOUS

## 2021-03-15 MED ORDER — SODIUM CHLORIDE 0.9% IV SOLUTION: Freq: Once | INTRAVENOUS | Status: AC

## 2021-03-15 MED ORDER — PROPOFOL 10 MG/ML IV BOLUS
INTRAVENOUS | Status: DC | PRN
  Administered 2021-03-15: 120 mg via INTRAVENOUS

## 2021-03-15 MED ORDER — FENTANYL CITRATE (PF) 100 MCG/2ML IJ SOLN
INTRAMUSCULAR | Status: DC | PRN
  Administered 2021-03-15 (×2): 50 ug via INTRAVENOUS

## 2021-03-15 MED ORDER — WARFARIN - PHARMACIST DOSING INPATIENT: Freq: Every day | Status: DC

## 2021-03-15 MED ORDER — LIDOCAINE 2% (20 MG/ML) 5 ML SYRINGE
INTRAMUSCULAR | Status: DC | PRN
  Administered 2021-03-15: 80 mg via INTRAVENOUS

## 2021-03-15 MED ORDER — SODIUM CHLORIDE 0.9 % IV SOLN
INTRAVENOUS | Status: DC | PRN
  Administered 2021-03-15: 15 mL

## 2021-03-15 MED ORDER — ONDANSETRON HCL 4 MG/2ML IJ SOLN
INTRAMUSCULAR | Status: DC | PRN
  Administered 2021-03-15: 4 mg via INTRAVENOUS

## 2021-03-15 MED ORDER — INDOMETHACIN 50 MG RE SUPP
RECTAL | Status: DC | PRN
  Administered 2021-03-15: 100 mg via RECTAL

## 2021-03-15 MED ORDER — MORPHINE SULFATE (PF) 2 MG/ML IV SOLN
2.0000 mg | INTRAVENOUS | Status: DC | PRN
  Administered 2021-03-15: 2 mg via INTRAVENOUS
  Filled 2021-03-15: qty 1

## 2021-03-15 MED ORDER — ROCURONIUM BROMIDE 10 MG/ML (PF) SYRINGE
PREFILLED_SYRINGE | INTRAVENOUS | Status: DC | PRN
  Administered 2021-03-15: 50 mg via INTRAVENOUS

## 2021-03-15 MED ORDER — DEXAMETHASONE SODIUM PHOSPHATE 10 MG/ML IJ SOLN
INTRAMUSCULAR | Status: DC | PRN
  Administered 2021-03-15: 10 mg via INTRAVENOUS

## 2021-03-15 MED ORDER — LABETALOL HCL 5 MG/ML IV SOLN: 10.0000 mg | INTRAVENOUS | Status: DC | PRN

## 2021-03-15 NOTE — Progress Notes (Signed)
PROGRESS NOTE    [name]  ZRA:[id number] DOB: [birth year] DOA: 03/13/2021 PCP: Kelton Pillar, MD    Brief Narrative:  75 year old female with history of paroxysmal A. fib on Coumadin and flecainide, diastolic heart failure, hypothyroidism, hypertension and history of choledocholithiasis status postcholecystectomy presented to the hospital with epigastric pain, nausea and vomiting for 1 day.  In the emergency room hemodynamically stable.  She was found to have significant elevated bilirubin of 5.8 with AST/ALT 205/122.  CT scan with no obvious abnormalities of biliary duct.  Pneumobilia present.  No other acute disease found.  GI consulted.  Started on IV antibiotics.  MRCP abnormal.   Assessment & Plan:   Principal Problem:   Choledocholithiasis Active Problems:   Hypothyroidism   Essential hypertension   AF (paroxysmal atrial fibrillation) (HCC)   Prolonged QT interval  Obstructive jaundice/nausea vomiting and abdominal pain/suspected choledocholithiasis with cholangitis. Hemodynamically stabilizing.  Scheduled for ERCP today with FFP. NPO.  IV fluids.  Adequate IV opiates for pain relief. Followed by GI.  Paroxysmal A. fib on Coumadin: Sinus rhythm on flecainide.  Continued.  Electrolytes are adequate. Patient on Coumadin with INR therapeutic.   5 mg vitamin K given 12/7.  INR 2.5.  Fresh frozen plasma before ERCP today. No need for bridging.   QTC is 496, on flecainide 100 mg twice a day.  Discussed with her primary cardiologist and continued similar doses.  Chronic diastolic dysfunction/hypertension/hyperlipidemia: Chronic and stable. Euvolemic. Antihypertensives on hold Statin on hold.     DVT prophylaxis: Place and maintain sequential compression device Start: 03/14/21 0820  Therapeutic on Coumadin   Code Status: Full code Family Communication: Husband at the bedside Disposition Plan: Status is: Inpatient  Remains inpatient appropriate because: Severe  presentation.  Inpatient procedures.         Consultants:  Gastroenterology  Procedures:  None  Antimicrobials:  Cefepime and Flagyl , vancomycin 12/6---   Subjective:  Patient seen and examined.  She had an episode of moderate epigastric pain earlier today that was relieved with 2 mg of morphine and then denies any complaints since then.  Husband was at the bedside.  They are expecting surgical procedure.  Consented for blood products.  Objective: Vitals:   03/14/21 2313 03/15/21 0402 03/15/21 0800 03/15/21 1029  BP: (!) 163/45 (!) 161/52 (!) 166/49 (!) 144/51  Pulse: (!) 59 (!) 59 (!) 57 (!) 52  Resp: 18 18 18 18   Temp: 98 F (36.7 C) 98.1 F (36.7 C) 97.7 F (36.5 C) (!) 97.5 F (36.4 C)  TempSrc: Oral Oral Oral Oral  SpO2: 100% 100% 98% 99%  Weight:      Height:        Intake/Output Summary (Last 24 hours) at 03/15/2021 1124 Last data filed at 03/15/2021 0659 Gross per 24 hour  Intake 1474.16 ml  Output --  Net 1474.16 ml   Filed Weights   03/13/21 1837  Weight: 90.3 kg    Examination:  General exam: Appears calm and comfortable after receiving pain medication. Respiratory system: Clear to auscultation. Respiratory effort normal. Cardiovascular system: S1 & S2 heard, RRR.  Gastrointestinal system: Soft.  Currently no tenderness.  Bowel sound present.   Central nervous system: Alert and oriented. No focal neurological deficits. Extremities: Symmetric 5 x 5 power. Skin: No rashes, lesions or ulcers Psychiatry: Judgement and insight appear normal. Mood & affect appropriate.     Data Reviewed: I have personally reviewed following labs and imaging studies  CBC: Recent Labs  Lab 03/13/21 1834 03/14/21 0419 03/15/21 0110  WBC 13.1* 21.2* 12.1*  NEUTROABS 10.9* 18.3* 8.6*  HGB 12.2 11.9* 11.2*  HCT 37.7 35.3* 34.4*  MCV 92.0 89.8 91.2  PLT 222 250 256   Basic Metabolic Panel: Recent Labs  Lab 03/13/21 1834 03/14/21 0000 03/14/21 0419  03/15/21 0110  NA 135  --  133* 137  K 4.3  --  4.3 3.9  CL 100  --  101 103  CO2 25  --  23 24  GLUCOSE 124*  --  105* 89  BUN 26*  --  23 14  CREATININE 1.28*  --  1.15* 1.27*  CALCIUM 8.6*  --  7.9* 8.6*  MG  --  1.8 1.8 1.9  PHOS  --   --   --  3.2   GFR: Estimated Creatinine Clearance: 38.6 mL/min (A) (by C-G formula based on SCr of 1.27 mg/dL (H)). Liver Function Tests: Recent Labs  Lab 03/13/21 1834 03/14/21 0419 03/15/21 0110  AST 205* 185* 137*  ALT 122* 115* 97*  ALKPHOS 642* 611* 524*  BILITOT 5.8* 5.8* 5.1*  PROT 7.2 6.2* 6.0*  ALBUMIN 2.8* 2.4* 2.3*   Recent Labs  Lab 03/13/21 1834  LIPASE 30   No results for input(s): AMMONIA in the last 168 hours. Coagulation Profile: Recent Labs  Lab 03/13/21 1834 03/14/21 0419 03/15/21 0110  INR 2.1* 2.4* 2.5*   Cardiac Enzymes: No results for input(s): CKTOTAL, CKMB, CKMBINDEX, TROPONINI in the last 168 hours. BNP (last 3 results) No results for input(s): PROBNP in the last 8760 hours. HbA1C: No results for input(s): HGBA1C in the last 72 hours. CBG: No results for input(s): GLUCAP in the last 168 hours. Lipid Profile: No results for input(s): CHOL, HDL, LDLCALC, TRIG, CHOLHDL, LDLDIRECT in the last 72 hours. Thyroid Function Tests: No results for input(s): TSH, T4TOTAL, FREET4, T3FREE, THYROIDAB in the last 72 hours. Anemia Panel: No results for input(s): VITAMINB12, FOLATE, FERRITIN, TIBC, IRON, RETICCTPCT in the last 72 hours. Sepsis Labs: Recent Labs  Lab 03/13/21 2240 03/14/21 0419  LATICACIDVEN 2.6* 1.3    Recent Results (from the past 240 hour(s))  Resp Panel by RT-PCR (Flu A&B, Covid) Nasopharyngeal Swab     Status: None   Collection Time: 03/13/21 10:25 PM   Specimen: Nasopharyngeal Swab; Nasopharyngeal(NP) swabs in vial transport medium  Result Value Ref Range Status   SARS Coronavirus 2 by RT PCR NEGATIVE NEGATIVE Final    Comment: (NOTE) SARS-CoV-2 target nucleic acids are NOT  DETECTED.  The SARS-CoV-2 RNA is generally detectable in upper respiratory specimens during the acute phase of infection. The lowest concentration of SARS-CoV-2 viral copies this assay can detect is 138 copies/mL. A negative result does not preclude SARS-Cov-2 infection and should not be used as the sole basis for treatment or other patient management decisions. A negative result may occur with  improper specimen collection/handling, submission of specimen other than nasopharyngeal swab, presence of viral mutation(s) within the areas targeted by this assay, and inadequate number of viral copies(<138 copies/mL). A negative result must be combined with clinical observations, patient history, and epidemiological information. The expected result is Negative.  Fact Sheet for Patients:  EntrepreneurPulse.com.au  Fact Sheet for Healthcare Providers:  IncredibleEmployment.be  This test is no t yet approved or cleared by the [place] FDA and  has been authorized for detection and/or diagnosis of SARS-CoV-2 by FDA under an Emergency Use Authorization (EUA). This EUA will remain  in effect (meaning this  test can be used) for the duration of the COVID-19 declaration under Section 564(b)(1) of the Act, 21 U.S.C.section 360bbb-3(b)(1), unless the authorization is terminated  or revoked sooner.       Influenza A by PCR NEGATIVE NEGATIVE Final   Influenza B by PCR NEGATIVE NEGATIVE Final    Comment: (NOTE) The Xpert Xpress SARS-CoV-2/FLU/RSV plus assay is intended as an aid in the diagnosis of influenza from Nasopharyngeal swab specimens and should not be used as a sole basis for treatment. Nasal washings and aspirates are unacceptable for Xpert Xpress SARS-CoV-2/FLU/RSV testing.  Fact Sheet for Patients: EntrepreneurPulse.com.au  Fact Sheet for Healthcare Providers: IncredibleEmployment.be  This test is not yet  approved or cleared by the [place] FDA and has been authorized for detection and/or diagnosis of SARS-CoV-2 by FDA under an Emergency Use Authorization (EUA). This EUA will remain in effect (meaning this test can be used) for the duration of the COVID-19 declaration under Section 564(b)(1) of the Act, 21 U.S.C. section 360bbb-3(b)(1), unless the authorization is terminated or revoked.  Performed at [facility], [place] [address] [place], [place] [address]   Blood culture (routine x 2)     Status: None (Preliminary result)   Collection Time: 03/13/21 10:40 PM   Specimen: BLOOD  Result Value Ref Range Status   Specimen Description BLOOD RIGHT ANTECUBITAL  Final   Special Requests   Final    BOTTLES DRAWN AEROBIC AND ANAEROBIC Blood Culture adequate volume   Culture   Final    NO GROWTH 1 DAY Performed at [facility], [place] [address] [place], [place] [address]    Report Status PENDING  Incomplete  Blood culture (routine x 2)     Status: None (Preliminary result)   Collection Time: 03/13/21 10:45 PM   Specimen: BLOOD LEFT HAND  Result Value Ref Range Status   Specimen Description BLOOD LEFT HAND  Final   Special Requests   Final    BOTTLES DRAWN AEROBIC AND ANAEROBIC Blood Culture results may not be optimal due to an inadequate volume of blood received in culture bottles   Culture   Final    NO GROWTH 1 DAY Performed at [facility], [place] [address] [place], [place] [address]    Report Status PENDING  Incomplete         Radiology Studies: CT ABDOMEN PELVIS W CONTRAST  Result Date: 03/13/2021 CLINICAL DATA:  Abdominal distension. EXAM: CT ABDOMEN AND PELVIS WITH CONTRAST TECHNIQUE: Multidetector CT imaging of the abdomen and pelvis was performed using the standard protocol following bolus administration of intravenous contrast. CONTRAST:  67mL OMNIPAQUE IOHEXOL 300 MG/ML  SOLN COMPARISON:  CT abdomen and pelvis 12/10/2005. FINDINGS: Lower chest: No acute  abnormality. Hepatobiliary: Status post cholecystectomy. There is no biliary ductal dilatation. Pneumobilia has increased when compared to the prior study. There is no focal liver lesion identified. Pancreas: Fatty replaced, unchanged. Spleen: Normal in size without focal abnormality. Adrenals/Urinary Tract: Left renal cysts are present measuring up to 2.6 cm. These are new from the prior examination. There is a nonobstructing 5 mm left renal calculus. There is no hydronephrosis or perinephric stranding. Adrenal glands and bladder are within normal limits. Stomach/Bowel: Stomach is within normal limits. no evidence of bowel wall thickening, distention, or inflammatory changes. There is sigmoid colon diverticulosis without evidence for diverticulitis. The appendix is not seen. Vascular/Lymphatic: Aortic atherosclerosis. No enlarged abdominal or pelvic lymph nodes. Reproductive: Status post hysterectomy. No adnexal masses. Other:  No abdominal wall hernia or abnormality. No abdominopelvic ascites. Musculoskeletal: No acute or significant osseous findings. IMPRESSION: 1.  No acute localizing process in the abdomen or pelvis. 2. Sigmoid colon diverticulosis without evidence for diverticulitis. 3. Cholecystectomy. Pneumobilia is present likely related to prior sphincterotomy. Please correlate clinically. 4.  Aortic Atherosclerosis (ICD10-I70.0). 5.  Nonobstructing left renal calculus.  New left renal cysts. Electronically Signed   By: Ronney Asters M.D.   On: 03/13/2021 21:28   DG Chest Port 1 View  Result Date: 03/13/2021 CLINICAL DATA:  Nausea and vomiting EXAM: PORTABLE CHEST 1 VIEW COMPARISON:  02/26/2021 FINDINGS: The heart size and mediastinal contours are within normal limits. Both lungs are clear. The visualized skeletal structures are unremarkable. IMPRESSION: No active disease. Electronically Signed   By: Inez Catalina M.D.   On: 03/13/2021 19:07   MR ABDOMEN MRCP W WO CONTAST  Result Date:  03/14/2021 CLINICAL DATA:  75 year old female with history of abdominal pain. Evaluate for biliary tract obstruction. EXAM: MRI ABDOMEN WITHOUT AND WITH CONTRAST (INCLUDING MRCP) TECHNIQUE: Multiplanar multisequence MR imaging of the abdomen was performed both before and after the administration of intravenous contrast. Heavily T2-weighted images of the biliary and pancreatic ducts were obtained, and three-dimensional MRCP images were rendered by post processing. CONTRAST:  58mL GADAVIST GADOBUTROL 1 MMOL/ML IV SOLN COMPARISON:  Abdominal MRI 08/09/2020. CT the abdomen and pelvis 03/13/2021. FINDINGS: Lower chest: Unremarkable. Hepatobiliary: No suspicious cystic or solid hepatic lesions. Status post cholecystectomy. MRCP images demonstrate very mild intra and extrahepatic biliary ductal dilatation. Common bile duct measures 8 mm in the porta hepatis, which may be within normal limits for this post cholecystectomy patient. There is a small filling defect in the distal common bile duct (coronal MRCP image 20 of series 14) measuring 5 mm. Several other signal voids are noted within the intrahepatic biliary tree, likely reflective of pneumobilia given the findings on recent CT examination. Pancreas: No pancreatic mass. No pancreatic ductal dilatation noted on MRCP images. No pancreatic or peripancreatic fluid collections or inflammatory changes. Spleen:  Unremarkable. Adrenals/Urinary Tract: T1 hypointense, T2 hyperintense, nonenhancing lesions in the left kidney, compatible with simple cysts, largest of which measures 3.5 cm in the lower pole. Right kidney and bilateral adrenal glands are normal in appearance. No hydroureteronephrosis in the visualized portions of the abdomen. Stomach/Bowel: Visualized portions are unremarkable. Vascular/Lymphatic: Aortic atherosclerosis, without evidence of aneurysm in the visualized abdominal vasculature. No lymphadenopathy noted in the abdomen. Other: No significant volume of  ascites noted in the visualized portions of the peritoneal cavity. Musculoskeletal: No aggressive appearing osseous lesions are noted in the visualized portions of the skeleton. IMPRESSION: 1. There is a small filling defect in the distal common bile duct suspicious for potential choledocholithiasis. However, given the presence of pneumobilia on the recent CT examination, the possibility that this filling defect is susceptibility artifact from pneumobilia is not excluded. There is some mild intra and extrahepatic biliary ductal dilatation. This could simply be reflective of benign post cholecystectomy physiology. However, if there is clinical concern for biliary tract obstruction, further evaluation with ERCP should be considered. 2. Simple (Bosniak class 1) cysts in the left kidney, as above. 3. Aortic atherosclerosis. Electronically Signed   By: Vinnie Langton M.D.   On: 03/14/2021 07:30        Scheduled Meds:  sodium chloride   Intravenous Once   flecainide  100 mg Oral Q12H   pantoprazole (PROTONIX) IV  40 mg Intravenous Q24H   Continuous Infusions:  cefTRIAXone (ROCEPHIN)  IV 2 g (03/15/21 1013)   lactated ringers 100 mL/hr at 03/15/21 0659   metronidazole 500 mg (03/15/21 1102)   vancomycin Stopped (03/14/21 1352)     LOS: 2 days    Time spent: 25 minutes    Barb Merino, MD [facility] Pager [phone]

## 2021-03-15 NOTE — Interval H&P Note (Signed)
History and Physical Interval Note:  03/15/2021 1:26 PM  [name]  has presented today for surgery, with the diagnosis of Recurrent choledocholithiasis.  The various methods of treatment have been discussed with the patient and family. After consideration of risks, benefits and other options for treatment, the patient has consented to  Procedure(s): ENDOSCOPIC RETROGRADE CHOLANGIOPANCREATOGRAPHY (ERCP) WITH PROPOFOL (N/A) as a surgical intervention.  The patient's history has been reviewed, patient examined, no change in status, stable for surgery.  I have reviewed the patient's chart and labs.  Questions were answered to the patient's satisfaction.     Silvano Rusk

## 2021-03-15 NOTE — Transfer of Care (Signed)
Immediate Anesthesia Transfer of Care Note  Patient: [name]  Procedure(s) Performed: ENDOSCOPIC RETROGRADE CHOLANGIOPANCREATOGRAPHY (ERCP) WITH PROPOFOL REMOVAL OF STONES  Patient Location: PACU  Anesthesia Type:General  Level of Consciousness: awake, alert  and oriented  Airway & Oxygen Therapy: Patient Spontanous Breathing and Patient connected to face mask oxygen  Post-op Assessment: Report given to RN and Post -op Vital signs reviewed and stable  Post vital signs: Reviewed and stable  Last Vitals:  Vitals Value Taken Time  BP 147/45 03/15/21 1436  Temp    Pulse 58 03/15/21 1436  Resp 16 03/15/21 1436  SpO2 100 % 03/15/21 1436  Vitals shown include unvalidated device data.  Last Pain:  Vitals:   03/15/21 1230  TempSrc: Temporal  PainSc: 0-No pain         Complications: No notable events documented.

## 2021-03-15 NOTE — Op Note (Signed)
[facility] Patient Name: [name] Procedure Date : 03/15/2021 MRN: [id number] Attending MD: Gatha Mayer , MD Date of Birth: [birth year] CSN: [id number] Age: 75 Admit Type: Inpatient Procedure:                ERCP Indications:              Abnormal MRCP, Jaundice, Elevated liver enzymes Providers:                Gatha Mayer, MD, Jeanella Cara, RN,                            [facility] Fall Technician, Technician Referring MD:              Medicines:                General Anesthesia, Indomethacin 100 mg PR, On 3                            Abx per hospital team - CTX, vancomycin and                            metronidazole Complications:            No immediate complications. Estimated Blood Loss:     Estimated blood loss: none. Procedure:                Pre-Anesthesia Assessment:                           - Prior to the procedure, a History and Physical                            was performed, and patient medications and                            allergies were reviewed. The patient's tolerance of                            previous anesthesia was also reviewed. The risks                            and benefits of the procedure and the sedation                            options and risks were discussed with the patient.                            All questions were answered, and informed consent                            was obtained. Prior Anticoagulants: The patient has                            taken Coumadin (warfarin), last dose was 2 days  prior to procedure. ASA Grade Assessment: III - A                            patient with severe systemic disease. After                            reviewing the risks and benefits, the patient was                            deemed in satisfactory condition to undergo the                            procedure.                           After obtaining informed consent, the scope was                             passed under direct vision. Throughout the                            procedure, the patient's blood pressure, pulse, and                            oxygen saturations were monitored continuously. The                            [id number]-[id number] ([id number]) Olympus duodenoscope was                            introduced through the mouth, and used to inject                            contrast into and used to cannulate the bile duct.                            The ERCP was accomplished without difficulty. The                            patient tolerated the procedure well. Scope In: Scope Out: Findings:      A scout film of the abdomen was obtained. Surgical clips, consistent       with a previous cholecystectomy, were seen in the area of the right       upper quadrant of the abdomen. Esophagus not seen well. Stomach grossly       NL. Duodenum NL except for papilla s/p sphincterotomy. Easy deep       cannulation obtained - balloon used to perform sweeps and occlusion       cholangiogram. An air bubble was seen but no stones and bile was NL       color. Mild extra and intrahepatic dilation. Impression:               - The entire biliary tree was mildly dilated.  Sounds like she had a retained stone that passed                            based upon all available info. Recommendation:           - Return patient to hospital ward for ongoing care.                           - I think ok to stop antibiotics tomorrow if ok                           Restart anticoagulation now ok                           Discussed w/ husband                           we will f/u tomorrow                           Note that last time took a while for LFTs to come                            down Procedure Code(s):        --- Professional ---                           [phone], Endoscopic retrograde                            cholangiopancreatography (ERCP); diagnostic,                             including collection of specimen(s) by brushing or                            washing, when performed (separate procedure) Diagnosis Code(s):        --- Professional ---                           R17, Unspecified jaundice                           R74.8, Abnormal levels of other serum enzymes                           K83.8, Other specified diseases of biliary tract                           R93.2, Abnormal findings on diagnostic imaging of                            liver and biliary tract CPT copyright 2019 American Medical Association. All rights reserved. The codes documented in this report are preliminary and upon coder review may  be revised to meet current compliance requirements. Gatha Mayer, MD 03/15/2021 2:44:20 PM This report has  been signed electronically. Number of Addenda: 0

## 2021-03-15 NOTE — Anesthesia Procedure Notes (Signed)
Procedure Name: Intubation Date/Time: 03/15/2021 1:47 PM Performed by: Genelle Bal, CRNA Pre-anesthesia Checklist: Patient identified, Emergency Drugs available, Suction available and Patient being monitored Patient Re-evaluated:Patient Re-evaluated prior to induction Oxygen Delivery Method: Circle system utilized Preoxygenation: Pre-oxygenation with 100% oxygen Induction Type: IV induction Ventilation: Mask ventilation without difficulty Laryngoscope Size: Miller and 2 Grade View: Grade I Tube type: Oral Tube size: 7.0 mm Number of attempts: 1 Airway Equipment and Method: Stylet and Oral airway Placement Confirmation: ETT inserted through vocal cords under direct vision, positive ETCO2 and breath sounds checked- equal and bilateral Secured at: 21 cm Tube secured with: Tape Dental Injury: Teeth and Oropharynx as per pre-operative assessment

## 2021-03-15 NOTE — Progress Notes (Signed)
Mobility Specialist Progress Note   03/15/21 1731  Mobility  Activity Contraindicated/medical hold (Had surgery today and would prefer to rest.)   Holland Falling Mobility Specialist Phone Number [phone]

## 2021-03-15 NOTE — Anesthesia Preprocedure Evaluation (Signed)
Anesthesia Evaluation  Patient identified by MRN, date of birth, ID band Patient awake    Reviewed: Allergy & Precautions, H&P , NPO status , Patient's Chart, lab work & pertinent test results  History of Anesthesia Complications (+) DIFFICULT AIRWAY and history of anesthetic complications  Airway Mallampati: II   Neck ROM: full    Dental   Pulmonary asthma , former smoker,    breath sounds clear to auscultation       Cardiovascular hypertension, + dysrhythmias Atrial Fibrillation  Rhythm:regular Rate:Normal  On Warfarin.  INR 2.5 at 1:10am today.  2 units FFP given.   Neuro/Psych    GI/Hepatic   Endo/Other  Hypothyroidism   Renal/GU      Musculoskeletal   Abdominal   Peds  Hematology   Anesthesia Other Findings   Reproductive/Obstetrics                             Anesthesia Physical Anesthesia Plan  ASA: 3  Anesthesia Plan: General   Post-op Pain Management:    Induction: Intravenous  PONV Risk Score and Plan: 3 and Ondansetron, Dexamethasone and Treatment may vary due to age or medical condition  Airway Management Planned: Oral ETT and Video Laryngoscope Planned  Additional Equipment:   Intra-op Plan:   Post-operative Plan: Extubation in OR  Informed Consent: I have reviewed the patients History and Physical, chart, labs and discussed the procedure including the risks, benefits and alternatives for the proposed anesthesia with the patient or authorized representative who has indicated his/her understanding and acceptance.     Dental advisory given  Plan Discussed with: CRNA, Anesthesiologist and Surgeon  Anesthesia Plan Comments:         Anesthesia Quick Evaluation

## 2021-03-15 NOTE — Progress Notes (Signed)
ANTICOAGULATION CONSULT NOTE - Initial Consult  Pharmacy Consult for Coumadin Indication: atrial fibrillation  Allergies  Allergen Reactions   Chocolate Itching    Itching mouth   Beef-Derived Products Itching   Cat Hair Extract Itching   Erythromycin     Other reaction(s): Unknown   Fish Allergy Itching   Other Itching    white potatoes, corn   Phenergan [Promethazine] Other (See Comments)    Pt and family states her nausea worsens with phenergan rather than improving to the point she states she cant have it.    Pork-Derived Products Itching   Shellfish Allergy    Soybean Extract Allergy Skin Test Itching   Strawberry Extract Itching   Naproxen Sodium Other (See Comments)    Hurt all over   Penicillins Rash    Has patient had a PCN reaction causing immediate rash, facial/tongue/throat swelling, SOB or lightheadedness with hypotension: Yes Has patient had a PCN reaction causing severe rash involving mucus membranes or skin necrosis: Yes Has patient had a PCN reaction that required hospitalization: No Has patient had a PCN reaction occurring within the last 10 years: No If all of the above answers are "NO", then may proceed with Cephalosporin use.    Promethazine Hcl Other (See Comments)    Sick     Patient Measurements: Height: 4\' 11"  (149.9 cm) Weight: 90.3 kg (199 lb) IBW/kg (Calculated) : 43.2  Vital Signs: Temp: 98.2 F (36.8 C) (12/08 1230) Temp Source: Temporal (12/08 1230) BP: 178/53 (12/08 1502) Pulse Rate: 54 (12/08 1502)  Labs: Recent Labs    03/13/21 1834 03/13/21 2034 03/14/21 0419 03/15/21 0110  HGB 12.2  --  11.9* 11.2*  HCT 37.7  --  35.3* 34.4*  PLT 222  --  250 223  LABPROT 23.6*  --  25.8* 26.8*  INR 2.1*  --  2.4* 2.5*  CREATININE 1.28*  --  1.15* 1.27*  TROPONINIHS 8 9  --   --     Estimated Creatinine Clearance: 38.6 mL/min (A) (by C-G formula based on SCr of 1.27 mg/dL (H)).   Medical History: Past Medical History:  Diagnosis  Date   Borderline hyperlipidemia    Borderline hypertension    Complication of anesthesia    PONV   Difficult intubation    Hypertension    Hypothyroid    s/p PTU therapy   Hypothyroidism 09/15/2008   Qualifier: Diagnosis of  By: Percival Spanish, MD, Johnell Comings (paroxysmal atrial fibrillation) ([place])     Medications:  No current facility-administered medications on file prior to encounter.   Current Outpatient Medications on File Prior to Encounter  Medication Sig Dispense Refill   flecainide (TAMBOCOR) 100 MG tablet Take 1 tablet (100 mg total) by mouth 2 (two) times daily. 180 tablet 3   omeprazole (PRILOSEC) 10 MG capsule Take 10 mg by mouth daily as needed (acid).      warfarin (COUMADIN) 5 MG tablet TAKE 1 to 2 TABLETS BY MOUTH ONCE DAILY OR AS  DIRECTED  BY  COUMADIN  CLINIC (Patient taking differently: Take 5 mg by mouth daily.) 90 tablet 1   potassium chloride SA (KLOR-CON) 20 MEQ tablet Take 1 tablet (20 mEq total) by mouth 2 (two) times daily. (Patient not taking: Reported on 03/14/2021) 10 tablet 0     Assessment: 75 y.o. female on warfarin for hx Afib, held for ERCP. Last dose 12/6. Pharmacy consulted for warfarin.  H/H 11s slow down trend post-op, no overt  bleeding. Plt stable. INR 2.5 is therapeutic despite holding warfarin x1. Note patient is on metronidazole, which significantly increases warfarin sensitivity.   PTA warfarin: 5 mg daily.  Goal of Therapy:  INR 2-3 Monitor platelets by anticoagulation protocol: Yes   Plan:  Warfarin 2.5mg  x1  Monitor daily INR, CBC/plt Monitor for signs/symptoms of bleeding   Benetta Spar, PharmD, BCPS, BCCP Clinical Pharmacist  Please check AMION for all [facility] phone numbers After 10:00 PM, call [place] [phone]

## 2021-03-16 ENCOUNTER — Other Ambulatory Visit: Payer: Self-pay

## 2021-03-16 DIAGNOSIS — R7989 Other specified abnormal findings of blood chemistry: Secondary | ICD-10-CM

## 2021-03-16 DIAGNOSIS — R17 Unspecified jaundice: Secondary | ICD-10-CM

## 2021-03-16 DIAGNOSIS — R1010 Upper abdominal pain, unspecified: Secondary | ICD-10-CM

## 2021-03-16 DIAGNOSIS — K8050 Calculus of bile duct without cholangitis or cholecystitis without obstruction: Secondary | ICD-10-CM | POA: Diagnosis not present

## 2021-03-16 LAB — BPAM FFP
Blood Product Expiration Date: 202212082359
Blood Product Expiration Date: 202212082359
ISSUE DATE / TIME: 202212081043
ISSUE DATE / TIME: 202212081148
Unit Type and Rh: 5100
Unit Type and Rh: 5100

## 2021-03-16 LAB — PREPARE FRESH FROZEN PLASMA: Unit division: 0

## 2021-03-16 LAB — COMPREHENSIVE METABOLIC PANEL
ALT: 82 U/L — ABNORMAL HIGH (ref 0–44)
AST: 106 U/L — ABNORMAL HIGH (ref 15–41)
Albumin: 2.4 g/dL — ABNORMAL LOW (ref 3.5–5.0)
Alkaline Phosphatase: 511 U/L — ABNORMAL HIGH (ref 38–126)
Anion gap: 14 (ref 5–15)
BUN: 19 mg/dL (ref 8–23)
CO2: 20 mmol/L — ABNORMAL LOW (ref 22–32)
Calcium: 8.6 mg/dL — ABNORMAL LOW (ref 8.9–10.3)
Chloride: 102 mmol/L (ref 98–111)
Creatinine, Ser: 1.1 mg/dL — ABNORMAL HIGH (ref 0.44–1.00)
GFR, Estimated: 53 mL/min — ABNORMAL LOW (ref >60)
Glucose, Bld: 116 mg/dL — ABNORMAL HIGH (ref 70–99)
Potassium: 4.6 mmol/L (ref 3.5–5.1)
Sodium: 136 mmol/L (ref 135–145)
Total Bilirubin: 7.5 mg/dL — ABNORMAL HIGH (ref 0.3–1.2)
Total Protein: 6.4 g/dL — ABNORMAL LOW (ref 6.5–8.1)

## 2021-03-16 LAB — PROTIME-INR
INR: 1.5 — ABNORMAL HIGH (ref 0.8–1.2)
Prothrombin Time: 17.8 seconds — ABNORMAL HIGH (ref 11.4–15.2)

## 2021-03-16 MED ORDER — WARFARIN SODIUM 7.5 MG PO TABS
7.5000 mg | ORAL_TABLET | Freq: Once | ORAL | Status: DC
  Filled 2021-03-16: qty 1

## 2021-03-16 NOTE — Care Management Important Message (Signed)
Important Message  Patient Details  Name: [name] MRN: [id number] Date of Birth: 11-16-1945   Medicare Important Message Given:  Yes     Hannah Beat 03/16/2021, 1:53 PM

## 2021-03-16 NOTE — Progress Notes (Signed)
Mobility Specialist Progress Note:   03/16/21 1030  Mobility  Activity Ambulated in hall  Level of Assistance Contact guard assist, steadying assist  Assistive Device None  Distance Ambulated (ft) 220 ft  Mobility Ambulated with assistance in hallway  Mobility Response Tolerated well  Mobility performed by Mobility specialist  $Mobility charge 1 Mobility   Pt asx during ambulation. Back in bed with all needs met.   Nelta Numbers Mobility Specialist  Phone [phone]

## 2021-03-16 NOTE — Progress Notes (Signed)
[name] Progress Note  CC:  Recurrent choledocholithiasis  Subjective:  Feels fine.  Wants to go home.  Objective:  Vital signs in last 24 hours: Temp:  [97.5 F (36.4 C)-98.4 F (36.9 C)] 97.8 F (36.6 C) (12/09 0727) Pulse Rate:  [50-69] 58 (12/09 0727) Resp:  [14-18] 18 (12/09 0727) BP: (139-179)/(45-63) 174/63 (12/09 0727) SpO2:  [96 %-100 %] 98 % (12/09 0727) Last BM Date: 03/14/21 General:  Alert, Well-developed, in NAD Heart:  Regular rate and rhythm; no murmurs Pulm:  CTAB.  No W/R/R. Abdomen:  Soft, non-distended.  BS present.  Non-tender.  Extremities:  Without edema. Neurologic:  Alert and oriented x 4;  grossly normal neurologically. Psych:  Alert and cooperative. Normal mood and affect.  Intake/Output from previous day: 12/08 0701 - 12/09 0700 In: 2865.7 [P.O.:120; I.V.:1986.1; Blood:359.6; IV Piggyback:400] Out: -  Intake/Output this shift: Total I/O In: 240 [P.O.:240] Out: -   Lab Results: Recent Labs    03/13/21 1834 03/14/21 0419 03/15/21 0110  WBC 13.1* 21.2* 12.1*  HGB 12.2 11.9* 11.2*  HCT 37.7 35.3* 34.4*  PLT 222 250 223   BMET Recent Labs    03/14/21 0419 03/15/21 0110 03/16/21 0139  NA 133* 137 136  K 4.3 3.9 4.6  CL 101 103 102  CO2 23 24 20*  GLUCOSE 105* 89 116*  BUN 23 14 19   CREATININE 1.15* 1.27* 1.10*  CALCIUM 7.9* 8.6* 8.6*   LFT Recent Labs    03/16/21 0139  PROT 6.4*  ALBUMIN 2.4*  AST 106*  ALT 82*  ALKPHOS 511*  BILITOT 7.5*   PT/INR Recent Labs    03/15/21 0110 03/16/21 0139  LABPROT 26.8* 17.8*  INR 2.5* 1.5*   DG ERCP  Result Date: 03/15/2021 CLINICAL DATA:  Possible choledocholithiasis MRCP EXAM: ERCP TECHNIQUE: Multiple spot images obtained with the fluoroscopic device and submitted for interpretation post-procedure. COMPARISON:  MRCP from previous day FINDINGS: A series of fluoroscopic spot images document endoscopic cannulation and opacification of the CBD. The central  ducts are mildly ectatic. The more peripheral ducts are unremarkable. No convincing filling defects to confirm choledocholithiasis. Cholecystectomy clips noted. No evidence of extravasation. IMPRESSION: ERCP as above.  No convincing choledocholithiasis on limited images. These images were submitted for radiologic interpretation only. Please see the procedural report for the amount of contrast and the fluoroscopy time utilized. Electronically Signed   By: Lucrezia Europe M.D.   On: 03/15/2021 14:54    Assessment / Plan: Choledocholithiasis, recurrent.  Has had choledocholithiasis dating back to 2009 when she had 2 ERCPs.  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  Underwent extension of sphincterotomy, balloon dilation and balloon sweep/clearance of recurrent CBD stones.  With elevated WBCs, lactic acid of 2.6, reports of sweats but no documented fevers, cannot r/o cholangitis.  Blood cultures in process.  Received 1 dose of cefepime and Flagyl early this morning. ERCP 12/8 was actually clean, no sign of CBD stones or cholangitis.  The best that we can determine is that she passed a stone.  Her LFTs are trending down with exception of the total bili, which is not unusual after manipulation.  Her LFTs were very slow to return to normal last time.  She is ok for discharge from a GI standpoint.  Does not need abx.  Follow-up was arranged with Dr. Loletha Carrow for early January and it is in her discharge instructions.  She will return for  repeat LFTs next week.   Chronic Coumadin.  Ok to resume as is being done.   LOS: 3 days   [name]. Kaori Jumper  03/16/2021, 9:34 AM

## 2021-03-16 NOTE — Discharge Instructions (Addendum)
Please come to the basement level of the [facility] building at [address] on Thursday, December 15 between 7:30 AM and 5 PM to have repeat labs performed to recheck your liver function tests.  The orders are entered, you do not need an appointment.  Just tell them you are there to have a liver test performed for Dr. Loletha Carrow.

## 2021-03-16 NOTE — Discharge Summary (Signed)
Physician Discharge Summary  [name] YSA:[id number] DOB: 09/16/1945 DOA: 03/13/2021  PCP: Kelton Pillar, MD  Admit date: 03/13/2021 Discharge date: 03/16/2021  Admitted From: Home Disposition: Home  Recommendations for Outpatient Follow-up:  Follow up with PCP in 1-2 weeks Please obtain CMP/CBC in 1 week. GI will schedule follow-up.  Home Health: N/A Equipment/Devices: N/A  Discharge Condition: Stable CODE STATUS: Full code Diet recommendation: Low-salt diet  Discharge summary: 75 year old female with history of paroxysmal A. fib on Coumadin and flecainide, diastolic heart failure, hypothyroidism, hypertension and history of choledocholithiasis status postcholecystectomy presented to the hospital with epigastric pain, nausea and vomiting for 1 day.  In the emergency room hemodynamically stable.  She was found to have significant elevated bilirubin of 5.8 with AST/ALT 205/122.  CT scan with no obvious abnormalities of biliary duct.  Pneumobilia present.  No other acute disease found.  GI consulted.  Started on IV antibiotics.  MRCP abnormal.  With obstructive jaundice, nausea vomiting and abnormal MRCP, patient underwent ERCP after reversal of Coumadin with vitamin K and FFP and found to have no intraluminal stones.  No evidence of infection.  There was suspicion that patient recently passed biliary stone. Symptomatically improved. Seen by gastroenterology and recommended no further intervention, recheck in few weeks to ensure normalization of LFTs.  Patient is on flecainide and Coumadin for paroxysmal A. fib.  Currently remains sinus rhythm. Her QTC is chronically elevated, discussed with her primary cardiologist who recommended to continue similar dose of flecainide. Coumadin was on hold for procedure, resumed 12/8.  No indication to bridge.  Patient is stable and discharged home today.   Discharge Diagnoses:  Principal Problem:   Choledocholithiasis Active Problems:    Hypothyroidism   Essential hypertension   AF (paroxysmal atrial fibrillation) (HCC)   Prolonged QT interval   Abnormal magnetic resonance cholangiopancreatography (MRCP)   Jaundice    Discharge Instructions  Discharge Instructions     Call MD for:  persistant nausea and vomiting   Complete by: As directed    Call MD for:  severe uncontrolled pain   Complete by: As directed    Diet - low sodium heart healthy   Complete by: As directed    Increase activity slowly   Complete by: As directed       Allergies as of 03/16/2021       Reactions   Chocolate Itching   Itching mouth   Beef-derived Products Itching   Cat Hair Extract Itching   Erythromycin    Other reaction(s): Unknown   Fish Allergy Itching   Other Itching   white potatoes, corn   Phenergan [promethazine] Other (See Comments)   Pt and family states her nausea worsens with phenergan rather than improving to the point she states she cant have it.    Pork-derived Products Itching   Shellfish Allergy    Soybean Extract Allergy Skin Test Itching   Strawberry Extract Itching   Naproxen Sodium Other (See Comments)   Hurt all over   Penicillins Rash   Has patient had a PCN reaction causing immediate rash, facial/tongue/throat swelling, SOB or lightheadedness with hypotension: Yes Has patient had a PCN reaction causing severe rash involving mucus membranes or skin necrosis: Yes Has patient had a PCN reaction that required hospitalization: No Has patient had a PCN reaction occurring within the last 10 years: No If all of the above answers are "NO", then may proceed with Cephalosporin use.   Promethazine Hcl Other (See Comments)   Sick  Medication List     STOP taking these medications    potassium chloride SA 20 MEQ tablet Commonly known as: KLOR-CON M       TAKE these medications    flecainide 100 MG tablet Commonly known as: TAMBOCOR Take 1 tablet (100 mg total) by mouth 2 (two) times daily.    omeprazole 10 MG capsule Commonly known as: PRILOSEC Take 10 mg by mouth daily as needed (acid).   warfarin 5 MG tablet Commonly known as: COUMADIN Take as directed. If you are unsure how to take this medication, talk to your nurse or doctor. Original instructions: TAKE 1 to 2 TABLETS BY MOUTH ONCE DAILY OR AS  DIRECTED  BY  COUMADIN  CLINIC What changed:  how much to take how to take this when to take this additional instructions        Follow-up Information     Doran Stabler, MD Follow up on 04/10/2021.   Specialty: Gastroenterology Why: 3:00pm Contact information: [address] [place] [address] [phone]                Allergies  Allergen Reactions   Chocolate Itching    Itching mouth   Beef-Derived Products Itching   Cat Hair Extract Itching   Erythromycin     Other reaction(s): Unknown   Fish Allergy Itching   Other Itching    white potatoes, corn   Phenergan [Promethazine] Other (See Comments)    Pt and family states her nausea worsens with phenergan rather than improving to the point she states she cant have it.    Pork-Derived Products Itching   Shellfish Allergy    Soybean Extract Allergy Skin Test Itching   Strawberry Extract Itching   Naproxen Sodium Other (See Comments)    Hurt all over   Penicillins Rash    Has patient had a PCN reaction causing immediate rash, facial/tongue/throat swelling, SOB or lightheadedness with hypotension: Yes Has patient had a PCN reaction causing severe rash involving mucus membranes or skin necrosis: Yes Has patient had a PCN reaction that required hospitalization: No Has patient had a PCN reaction occurring within the last 10 years: No If all of the above answers are "NO", then may proceed with Cephalosporin use.    Promethazine Hcl Other (See Comments)    Sick     Consultations: Gastroenterology   Procedures/Studies: CT ABDOMEN PELVIS W CONTRAST  Result Date: 03/13/2021 CLINICAL  DATA:  Abdominal distension. EXAM: CT ABDOMEN AND PELVIS WITH CONTRAST TECHNIQUE: Multidetector CT imaging of the abdomen and pelvis was performed using the standard protocol following bolus administration of intravenous contrast. CONTRAST:  48mL OMNIPAQUE IOHEXOL 300 MG/ML  SOLN COMPARISON:  CT abdomen and pelvis 12/10/2005. FINDINGS: Lower chest: No acute abnormality. Hepatobiliary: Status post cholecystectomy. There is no biliary ductal dilatation. Pneumobilia has increased when compared to the prior study. There is no focal liver lesion identified. Pancreas: Fatty replaced, unchanged. Spleen: Normal in size without focal abnormality. Adrenals/Urinary Tract: Left renal cysts are present measuring up to 2.6 cm. These are new from the prior examination. There is a nonobstructing 5 mm left renal calculus. There is no hydronephrosis or perinephric stranding. Adrenal glands and bladder are within normal limits. Stomach/Bowel: Stomach is within normal limits. no evidence of bowel wall thickening, distention, or inflammatory changes. There is sigmoid colon diverticulosis without evidence for diverticulitis. The appendix is not seen. Vascular/Lymphatic: Aortic atherosclerosis. No enlarged abdominal or pelvic lymph nodes. Reproductive: Status post  hysterectomy. No adnexal masses. Other: No abdominal wall hernia or abnormality. No abdominopelvic ascites. Musculoskeletal: No acute or significant osseous findings. IMPRESSION: 1.  No acute localizing process in the abdomen or pelvis. 2. Sigmoid colon diverticulosis without evidence for diverticulitis. 3. Cholecystectomy. Pneumobilia is present likely related to prior sphincterotomy. Please correlate clinically. 4.  Aortic Atherosclerosis (ICD10-I70.0). 5.  Nonobstructing left renal calculus.  New left renal cysts. Electronically Signed   By: Ronney Asters M.D.   On: 03/13/2021 21:28   DG Chest Port 1 View  Result Date: 03/13/2021 CLINICAL DATA:  Nausea and vomiting EXAM:  PORTABLE CHEST 1 VIEW COMPARISON:  02/26/2021 FINDINGS: The heart size and mediastinal contours are within normal limits. Both lungs are clear. The visualized skeletal structures are unremarkable. IMPRESSION: No active disease. Electronically Signed   By: Inez Catalina M.D.   On: 03/13/2021 19:07   DG Chest Port 1 View  Result Date: 02/26/2021 CLINICAL DATA:  Chest pain and shortness of breath EXAM: PORTABLE CHEST 1 VIEW COMPARISON:  06/21/2018 FINDINGS: Cardiac shadow is enlarged but accentuated by the portable technique. Lungs are clear. No bony abnormality is noted. IMPRESSION: No active disease. Electronically Signed   By: Inez Catalina M.D.   On: 02/26/2021 01:43   DG ERCP  Result Date: 03/15/2021 CLINICAL DATA:  Possible choledocholithiasis MRCP EXAM: ERCP TECHNIQUE: Multiple spot images obtained with the fluoroscopic device and submitted for interpretation post-procedure. COMPARISON:  MRCP from previous day FINDINGS: A series of fluoroscopic spot images document endoscopic cannulation and opacification of the CBD. The central ducts are mildly ectatic. The more peripheral ducts are unremarkable. No convincing filling defects to confirm choledocholithiasis. Cholecystectomy clips noted. No evidence of extravasation. IMPRESSION: ERCP as above.  No convincing choledocholithiasis on limited images. These images were submitted for radiologic interpretation only. Please see the procedural report for the amount of contrast and the fluoroscopy time utilized. Electronically Signed   By: Lucrezia Europe M.D.   On: 03/15/2021 14:54   MR ABDOMEN MRCP W WO CONTAST  Result Date: 03/14/2021 CLINICAL DATA:  75 year old female with history of abdominal pain. Evaluate for biliary tract obstruction. EXAM: MRI ABDOMEN WITHOUT AND WITH CONTRAST (INCLUDING MRCP) TECHNIQUE: Multiplanar multisequence MR imaging of the abdomen was performed both before and after the administration of intravenous contrast. Heavily T2-weighted  images of the biliary and pancreatic ducts were obtained, and three-dimensional MRCP images were rendered by post processing. CONTRAST:  82mL GADAVIST GADOBUTROL 1 MMOL/ML IV SOLN COMPARISON:  Abdominal MRI 08/09/2020. CT the abdomen and pelvis 03/13/2021. FINDINGS: Lower chest: Unremarkable. Hepatobiliary: No suspicious cystic or solid hepatic lesions. Status post cholecystectomy. MRCP images demonstrate very mild intra and extrahepatic biliary ductal dilatation. Common bile duct measures 8 mm in the porta hepatis, which may be within normal limits for this post cholecystectomy patient. There is a small filling defect in the distal common bile duct (coronal MRCP image 20 of series 14) measuring 5 mm. Several other signal voids are noted within the intrahepatic biliary tree, likely reflective of pneumobilia given the findings on recent CT examination. Pancreas: No pancreatic mass. No pancreatic ductal dilatation noted on MRCP images. No pancreatic or peripancreatic fluid collections or inflammatory changes. Spleen:  Unremarkable. Adrenals/Urinary Tract: T1 hypointense, T2 hyperintense, nonenhancing lesions in the left kidney, compatible with simple cysts, largest of which measures 3.5 cm in the lower pole. Right kidney and bilateral adrenal glands are normal in appearance. No hydroureteronephrosis in the visualized portions of the abdomen. Stomach/Bowel: Visualized portions are unremarkable.  Vascular/Lymphatic: Aortic atherosclerosis, without evidence of aneurysm in the visualized abdominal vasculature. No lymphadenopathy noted in the abdomen. Other: No significant volume of ascites noted in the visualized portions of the peritoneal cavity. Musculoskeletal: No aggressive appearing osseous lesions are noted in the visualized portions of the skeleton. IMPRESSION: 1. There is a small filling defect in the distal common bile duct suspicious for potential choledocholithiasis. However, given the presence of pneumobilia on  the recent CT examination, the possibility that this filling defect is susceptibility artifact from pneumobilia is not excluded. There is some mild intra and extrahepatic biliary ductal dilatation. This could simply be reflective of benign post cholecystectomy physiology. However, if there is clinical concern for biliary tract obstruction, further evaluation with ERCP should be considered. 2. Simple (Bosniak class 1) cysts in the left kidney, as above. 3. Aortic atherosclerosis. Electronically Signed   By: Vinnie Langton M.D.   On: 03/14/2021 07:30   (Echo, Carotid, EGD, Colonoscopy, ERCP)    Subjective: Patient was seen and examined.  Husband at the bedside.  She denies any nausea vomiting or abdominal pain.  Eager to go home.   Discharge Exam: Vitals:   03/16/21 0235 03/16/21 0727  BP: (!) 177/59 (!) 174/63  Pulse: 69 (!) 58  Resp: 17 18  Temp: 97.9 F (36.6 C) 97.8 F (36.6 C)  SpO2: 99% 98%   Vitals:   03/15/21 1657 03/15/21 2126 03/16/21 0235 03/16/21 0727  BP: (!) 173/57 (!) 161/58 (!) 177/59 (!) 174/63  Pulse: (!) 54 (!) 51 69 (!) 58  Resp: 14 18 17 18   Temp: 97.6 F (36.4 C) 97.9 F (36.6 C) 97.9 F (36.6 C) 97.8 F (36.6 C)  TempSrc: Oral Oral Oral   SpO2: 96% 97% 99% 98%  Weight:      Height:        General: Pt is alert, awake, not in acute distress Cardiovascular: RRR, S1/S2 +, no rubs, no gallops Respiratory: CTA bilaterally, no wheezing, no rhonchi Abdominal: Soft, NT, ND, bowel sounds + Extremities: no edema, no cyanosis    The results of significant diagnostics from this hospitalization (including imaging, microbiology, ancillary and laboratory) are listed below for reference.     Microbiology: Recent Results (from the past 240 hour(s))  Resp Panel by RT-PCR (Flu A&B, Covid) Nasopharyngeal Swab     Status: None   Collection Time: 03/13/21 10:25 PM   Specimen: Nasopharyngeal Swab; Nasopharyngeal(NP) swabs in vial transport medium  Result Value Ref  Range Status   SARS Coronavirus 2 by RT PCR NEGATIVE NEGATIVE Final    Comment: (NOTE) SARS-CoV-2 target nucleic acids are NOT DETECTED.  The SARS-CoV-2 RNA is generally detectable in upper respiratory specimens during the acute phase of infection. The lowest concentration of SARS-CoV-2 viral copies this assay can detect is 138 copies/mL. A negative result does not preclude SARS-Cov-2 infection and should not be used as the sole basis for treatment or other patient management decisions. A negative result may occur with  improper specimen collection/handling, submission of specimen other than nasopharyngeal swab, presence of viral mutation(s) within the areas targeted by this assay, and inadequate number of viral copies(<138 copies/mL). A negative result must be combined with clinical observations, patient history, and epidemiological information. The expected result is Negative.  Fact Sheet for Patients:  EntrepreneurPulse.com.au  Fact Sheet for Healthcare Providers:  IncredibleEmployment.be  This test is no t yet approved or cleared by the [place] FDA and  has been authorized for detection and/or diagnosis of SARS-CoV-2  by FDA under an Emergency Use Authorization (EUA). This EUA will remain  in effect (meaning this test can be used) for the duration of the COVID-19 declaration under Section 564(b)(1) of the Act, 21 U.S.C.section 360bbb-3(b)(1), unless the authorization is terminated  or revoked sooner.       Influenza A by PCR NEGATIVE NEGATIVE Final   Influenza B by PCR NEGATIVE NEGATIVE Final    Comment: (NOTE) The Xpert Xpress SARS-CoV-2/FLU/RSV plus assay is intended as an aid in the diagnosis of influenza from Nasopharyngeal swab specimens and should not be used as a sole basis for treatment. Nasal washings and aspirates are unacceptable for Xpert Xpress SARS-CoV-2/FLU/RSV testing.  Fact Sheet for  Patients: EntrepreneurPulse.com.au  Fact Sheet for Healthcare Providers: IncredibleEmployment.be  This test is not yet approved or cleared by the [place] FDA and has been authorized for detection and/or diagnosis of SARS-CoV-2 by FDA under an Emergency Use Authorization (EUA). This EUA will remain in effect (meaning this test can be used) for the duration of the COVID-19 declaration under Section 564(b)(1) of the Act, 21 U.S.C. section 360bbb-3(b)(1), unless the authorization is terminated or revoked.  Performed at [facility], [place] [address] [place], [place] [address]   Blood culture (routine x 2)     Status: None (Preliminary result)   Collection Time: 03/13/21 10:40 PM   Specimen: BLOOD  Result Value Ref Range Status   Specimen Description BLOOD RIGHT ANTECUBITAL  Final   Special Requests   Final    BOTTLES DRAWN AEROBIC AND ANAEROBIC Blood Culture adequate volume   Culture   Final    NO GROWTH 2 DAYS Performed at [facility], [place] [address], [place], [place] [address]    Report Status PENDING  Incomplete  Blood culture (routine x 2)     Status: None (Preliminary result)   Collection Time: 03/13/21 10:45 PM   Specimen: BLOOD LEFT HAND  Result Value Ref Range Status   Specimen Description BLOOD LEFT HAND  Final   Special Requests   Final    BOTTLES DRAWN AEROBIC AND ANAEROBIC Blood Culture results may not be optimal due to an inadequate volume of blood received in culture bottles   Culture   Final    NO GROWTH 2 DAYS Performed at [facility], [place] [address] [place], [place] [address]    Report Status PENDING  Incomplete     Labs: BNP (last 3 results) No results for input(s): BNP in the last 8760 hours. Basic Metabolic Panel: Recent Labs  Lab 03/13/21 1834 03/14/21 0000 03/14/21 0419 03/15/21 0110 03/16/21 0139  NA 135  --  133* 137 136  K 4.3  --  4.3 3.9 4.6  CL 100  --  101 103 102  CO2 25  --   23 24 20*  GLUCOSE 124*  --  105* 89 116*  BUN 26*  --  23 14 19   CREATININE 1.28*  --  1.15* 1.27* 1.10*  CALCIUM 8.6*  --  7.9* 8.6* 8.6*  MG  --  1.8 1.8 1.9  --   PHOS  --   --   --  3.2  --    Liver Function Tests: Recent Labs  Lab 03/13/21 1834 03/14/21 0419 03/15/21 0110 03/16/21 0139  AST 205* 185* 137* 106*  ALT 122* 115* 97* 82*  ALKPHOS 642* 611* 524* 511*  BILITOT 5.8* 5.8* 5.1* 7.5*  PROT 7.2 6.2* 6.0* 6.4*  ALBUMIN 2.8* 2.4*  2.3* 2.4*   Recent Labs  Lab 03/13/21 1834  LIPASE 30   No results for input(s): AMMONIA in the last 168 hours. CBC: Recent Labs  Lab 03/13/21 1834 03/14/21 0419 03/15/21 0110  WBC 13.1* 21.2* 12.1*  NEUTROABS 10.9* 18.3* 8.6*  HGB 12.2 11.9* 11.2*  HCT 37.7 35.3* 34.4*  MCV 92.0 89.8 91.2  PLT 222 250 223   Cardiac Enzymes: No results for input(s): CKTOTAL, CKMB, CKMBINDEX, TROPONINI in the last 168 hours. BNP: Invalid input(s): POCBNP CBG: No results for input(s): GLUCAP in the last 168 hours. D-Dimer No results for input(s): DDIMER in the last 72 hours. Hgb A1c No results for input(s): HGBA1C in the last 72 hours. Lipid Profile No results for input(s): CHOL, HDL, LDLCALC, TRIG, CHOLHDL, LDLDIRECT in the last 72 hours. Thyroid function studies No results for input(s): TSH, T4TOTAL, T3FREE, THYROIDAB in the last 72 hours.  Invalid input(s): FREET3 Anemia work up No results for input(s): VITAMINB12, FOLATE, FERRITIN, TIBC, IRON, RETICCTPCT in the last 72 hours. Urinalysis    Component Value Date/Time   COLORURINE YELLOW 06/21/2018 1255   APPEARANCEUR HAZY (A) 06/21/2018 1255   LABSPEC 1.006 06/21/2018 1255   PHURINE 6.0 06/21/2018 1255   GLUCOSEU NEGATIVE 06/21/2018 1255   HGBUR SMALL (A) 06/21/2018 1255   BILIRUBINUR NEGATIVE 06/21/2018 1255   KETONESUR NEGATIVE 06/21/2018 1255   PROTEINUR 30 (A) 06/21/2018 1255   NITRITE NEGATIVE 06/21/2018 1255   LEUKOCYTESUR NEGATIVE 06/21/2018 1255   Sepsis  Labs Invalid input(s): PROCALCITONIN,  WBC,  LACTICIDVEN Microbiology Recent Results (from the past 240 hour(s))  Resp Panel by RT-PCR (Flu A&B, Covid) Nasopharyngeal Swab     Status: None   Collection Time: 03/13/21 10:25 PM   Specimen: Nasopharyngeal Swab; Nasopharyngeal(NP) swabs in vial transport medium  Result Value Ref Range Status   SARS Coronavirus 2 by RT PCR NEGATIVE NEGATIVE Final    Comment: (NOTE) SARS-CoV-2 target nucleic acids are NOT DETECTED.  The SARS-CoV-2 RNA is generally detectable in upper respiratory specimens during the acute phase of infection. The lowest concentration of SARS-CoV-2 viral copies this assay can detect is 138 copies/mL. A negative result does not preclude SARS-Cov-2 infection and should not be used as the sole basis for treatment or other patient management decisions. A negative result may occur with  improper specimen collection/handling, submission of specimen other than nasopharyngeal swab, presence of viral mutation(s) within the areas targeted by this assay, and inadequate number of viral copies(<138 copies/mL). A negative result must be combined with clinical observations, patient history, and epidemiological information. The expected result is Negative.  Fact Sheet for Patients:  EntrepreneurPulse.com.au  Fact Sheet for Healthcare Providers:  IncredibleEmployment.be  This test is no t yet approved or cleared by the [place] FDA and  has been authorized for detection and/or diagnosis of SARS-CoV-2 by FDA under an Emergency Use Authorization (EUA). This EUA will remain  in effect (meaning this test can be used) for the duration of the COVID-19 declaration under Section 564(b)(1) of the Act, 21 U.S.C.section 360bbb-3(b)(1), unless the authorization is terminated  or revoked sooner.       Influenza A by PCR NEGATIVE NEGATIVE Final   Influenza B by PCR NEGATIVE NEGATIVE Final    Comment:  (NOTE) The Xpert Xpress SARS-CoV-2/FLU/RSV plus assay is intended as an aid in the diagnosis of influenza from Nasopharyngeal swab specimens and should not be used as a sole basis for treatment. Nasal washings and aspirates are unacceptable for Xpert Xpress SARS-CoV-2/FLU/RSV  testing.  Fact Sheet for Patients: EntrepreneurPulse.com.au  Fact Sheet for Healthcare Providers: IncredibleEmployment.be  This test is not yet approved or cleared by the [place] FDA and has been authorized for detection and/or diagnosis of SARS-CoV-2 by FDA under an Emergency Use Authorization (EUA). This EUA will remain in effect (meaning this test can be used) for the duration of the COVID-19 declaration under Section 564(b)(1) of the Act, 21 U.S.C. section 360bbb-3(b)(1), unless the authorization is terminated or revoked.  Performed at [facility], [place] [address], [place], [place] [address]   Blood culture (routine x 2)     Status: None (Preliminary result)   Collection Time: 03/13/21 10:40 PM   Specimen: BLOOD  Result Value Ref Range Status   Specimen Description BLOOD RIGHT ANTECUBITAL  Final   Special Requests   Final    BOTTLES DRAWN AEROBIC AND ANAEROBIC Blood Culture adequate volume   Culture   Final    NO GROWTH 2 DAYS Performed at [facility], [place] [address] [place], [place] [address]    Report Status PENDING  Incomplete  Blood culture (routine x 2)     Status: None (Preliminary result)   Collection Time: 03/13/21 10:45 PM   Specimen: BLOOD LEFT HAND  Result Value Ref Range Status   Specimen Description BLOOD LEFT HAND  Final   Special Requests   Final    BOTTLES DRAWN AEROBIC AND ANAEROBIC Blood Culture results may not be optimal due to an inadequate volume of blood received in culture bottles   Culture   Final    NO GROWTH 2 DAYS Performed at [facility], [place] [address], [place], [place] [address]    Report Status PENDING   Incomplete     Time coordinating discharge: 32 minutes  SIGNED:   Barb Merino, MD  [facility] 03/16/2021, 1:53 PM

## 2021-03-16 NOTE — Progress Notes (Signed)
ANTICOAGULATION CONSULT NOTE - Initial Consult  Pharmacy Consult for Coumadin Indication: atrial fibrillation  Allergies  Allergen Reactions   Chocolate Itching    Itching mouth   Beef-Derived Products Itching   Cat Hair Extract Itching   Erythromycin     Other reaction(s): Unknown   Fish Allergy Itching   Other Itching    white potatoes, corn   Phenergan [Promethazine] Other (See Comments)    Pt and family states her nausea worsens with phenergan rather than improving to the point she states she cant have it.    Pork-Derived Products Itching   Shellfish Allergy    Soybean Extract Allergy Skin Test Itching   Strawberry Extract Itching   Naproxen Sodium Other (See Comments)    Hurt all over   Penicillins Rash    Has patient had a PCN reaction causing immediate rash, facial/tongue/throat swelling, SOB or lightheadedness with hypotension: Yes Has patient had a PCN reaction causing severe rash involving mucus membranes or skin necrosis: Yes Has patient had a PCN reaction that required hospitalization: No Has patient had a PCN reaction occurring within the last 10 years: No If all of the above answers are "NO", then may proceed with Cephalosporin use.    Promethazine Hcl Other (See Comments)    Sick     Patient Measurements: Height: 4\' 11"  (149.9 cm) Weight: 90.3 kg (199 lb) IBW/kg (Calculated) : 43.2  Vital Signs: Temp: 97.8 F (36.6 C) (12/09 0727) Temp Source: Oral (12/09 0235) BP: 174/63 (12/09 0727) Pulse Rate: 58 (12/09 0727)  Labs: Recent Labs    03/13/21 1834 03/13/21 2034 03/14/21 0419 03/15/21 0110 03/16/21 0139  HGB 12.2  --  11.9* 11.2*  --   HCT 37.7  --  35.3* 34.4*  --   PLT 222  --  250 223  --   LABPROT 23.6*  --  25.8* 26.8* 17.8*  INR 2.1*  --  2.4* 2.5* 1.5*  CREATININE 1.28*  --  1.15* 1.27* 1.10*  TROPONINIHS 8 9  --   --   --      Estimated Creatinine Clearance: 44.6 mL/min (A) (by C-G formula based on SCr of 1.1 mg/dL  (H)).   Medical History: Past Medical History:  Diagnosis Date   Borderline hyperlipidemia    Borderline hypertension    Complication of anesthesia    PONV   Difficult intubation    Hypertension    Hypothyroid    s/p PTU therapy   Hypothyroidism 09/15/2008   Qualifier: Diagnosis of  By: Percival Spanish, MD, Johnell Comings (paroxysmal atrial fibrillation) ([place])     Medications:  No current facility-administered medications on file prior to encounter.   Current Outpatient Medications on File Prior to Encounter  Medication Sig Dispense Refill   flecainide (TAMBOCOR) 100 MG tablet Take 1 tablet (100 mg total) by mouth 2 (two) times daily. 180 tablet 3   omeprazole (PRILOSEC) 10 MG capsule Take 10 mg by mouth daily as needed (acid).      warfarin (COUMADIN) 5 MG tablet TAKE 1 to 2 TABLETS BY MOUTH ONCE DAILY OR AS  DIRECTED  BY  COUMADIN  CLINIC (Patient taking differently: Take 5 mg by mouth daily.) 90 tablet 1   potassium chloride SA (KLOR-CON) 20 MEQ tablet Take 1 tablet (20 mEq total) by mouth 2 (two) times daily. (Patient not taking: Reported on 03/14/2021) 10 tablet 0     Assessment: 75 y.o. female on warfarin for hx Afib, held  for ERCP. Last dose 12/6. Vitamin K PO 5mg  given on 12/7. Anticipate this will keep the INR down for many days. Pharmacy consulted for warfarin.  No new CBC. INR down to 1.5, reflecting holding dose on 12/7 and vitamin K PO. Now off of metronidazole.  Will need higher dosing for several days due to PO vitamin K.   PTA warfarin: 5 mg daily.  Goal of Therapy:  INR 2-3 Monitor platelets by anticoagulation protocol: Yes   Plan:  Warfarin 7.5mg  x1  Monitor daily INR, CBC/plt Monitor for signs/symptoms of bleeding   Benetta Spar, PharmD, BCPS, BCCP Clinical Pharmacist  Please check AMION for all [facility] [place] phone numbers After 10:00 PM, call [place] [phone]

## 2021-03-16 NOTE — Plan of Care (Signed)

## 2021-03-18 ENCOUNTER — Encounter (HOSPITAL_COMMUNITY): Payer: Self-pay | Admitting: Internal Medicine

## 2021-03-19 LAB — CULTURE, BLOOD (ROUTINE X 2)
Culture: NO GROWTH
Culture: NO GROWTH
Special Requests: ADEQUATE

## 2021-03-20 NOTE — Anesthesia Postprocedure Evaluation (Signed)
Anesthesia Post Note  Patient: EILA RUNYAN  Procedure(s) Performed: ENDOSCOPIC RETROGRADE CHOLANGIOPANCREATOGRAPHY (ERCP) WITH PROPOFOL     Patient location during evaluation: PACU Anesthesia Type: General Level of consciousness: awake and alert Pain management: pain level controlled Vital Signs Assessment: post-procedure vital signs reviewed and stable Respiratory status: spontaneous breathing, nonlabored ventilation, respiratory function stable and patient connected to nasal cannula oxygen Cardiovascular status: blood pressure returned to baseline and stable Postop Assessment: no apparent nausea or vomiting Anesthetic complications: no   No notable events documented.  Last Vitals:  Vitals:   03/16/21 0235 03/16/21 0727  BP: (!) 177/59 (!) 174/63  Pulse: 69 (!) 58  Resp: 17 18  Temp: 36.6 C 36.6 C  SpO2: 99% 98%    Last Pain:  Vitals:   03/16/21 0235  TempSrc: Oral  PainSc:                  [place]

## 2021-03-22 ENCOUNTER — Other Ambulatory Visit: Payer: Self-pay

## 2021-03-22 ENCOUNTER — Ambulatory Visit (INDEPENDENT_AMBULATORY_CARE_PROVIDER_SITE_OTHER): Payer: Medicare PPO

## 2021-03-22 DIAGNOSIS — Z5181 Encounter for therapeutic drug level monitoring: Secondary | ICD-10-CM

## 2021-03-22 DIAGNOSIS — I480 Paroxysmal atrial fibrillation: Secondary | ICD-10-CM | POA: Diagnosis not present

## 2021-03-22 LAB — POCT INR: INR: 3.4 — AB (ref 2.0–3.0)

## 2021-03-22 NOTE — Patient Instructions (Signed)
HOLD tonight and then Continue taking 5 mg daily. Repeat 4 weeks [phone]

## 2021-03-28 ENCOUNTER — Other Ambulatory Visit (INDEPENDENT_AMBULATORY_CARE_PROVIDER_SITE_OTHER): Payer: Medicare PPO

## 2021-03-28 DIAGNOSIS — R7989 Other specified abnormal findings of blood chemistry: Secondary | ICD-10-CM | POA: Diagnosis not present

## 2021-03-28 LAB — HEPATIC FUNCTION PANEL
ALT: 61 U/L — ABNORMAL HIGH (ref 0–35)
AST: 104 U/L — ABNORMAL HIGH (ref 0–37)
Albumin: 3.1 g/dL — ABNORMAL LOW (ref 3.5–5.2)
Alkaline Phosphatase: 691 U/L — ABNORMAL HIGH (ref 39–117)
Bilirubin, Direct: 2.3 mg/dL — ABNORMAL HIGH (ref 0.0–0.3)
Total Bilirubin: 4.4 mg/dL — ABNORMAL HIGH (ref 0.2–1.2)
Total Protein: 6.9 g/dL (ref 6.0–8.3)

## 2021-04-03 DIAGNOSIS — G8929 Other chronic pain: Secondary | ICD-10-CM | POA: Diagnosis not present

## 2021-04-03 DIAGNOSIS — Z6841 Body Mass Index (BMI) 40.0 and over, adult: Secondary | ICD-10-CM | POA: Diagnosis not present

## 2021-04-03 DIAGNOSIS — E785 Hyperlipidemia, unspecified: Secondary | ICD-10-CM | POA: Diagnosis not present

## 2021-04-03 DIAGNOSIS — D6869 Other thrombophilia: Secondary | ICD-10-CM | POA: Diagnosis not present

## 2021-04-03 DIAGNOSIS — M1990 Unspecified osteoarthritis, unspecified site: Secondary | ICD-10-CM | POA: Diagnosis not present

## 2021-04-03 DIAGNOSIS — K219 Gastro-esophageal reflux disease without esophagitis: Secondary | ICD-10-CM | POA: Diagnosis not present

## 2021-04-03 DIAGNOSIS — I4891 Unspecified atrial fibrillation: Secondary | ICD-10-CM | POA: Diagnosis not present

## 2021-04-03 DIAGNOSIS — I10 Essential (primary) hypertension: Secondary | ICD-10-CM | POA: Diagnosis not present

## 2021-04-10 ENCOUNTER — Ambulatory Visit: Payer: Medicare PPO | Admitting: Gastroenterology

## 2021-04-10 ENCOUNTER — Other Ambulatory Visit (INDEPENDENT_AMBULATORY_CARE_PROVIDER_SITE_OTHER): Payer: Medicare PPO

## 2021-04-10 ENCOUNTER — Encounter: Payer: Self-pay | Admitting: Gastroenterology

## 2021-04-10 VITALS — BP 162/82 | HR 68 | Ht 59.0 in | Wt 204.2 lb

## 2021-04-10 DIAGNOSIS — R1013 Epigastric pain: Secondary | ICD-10-CM

## 2021-04-10 DIAGNOSIS — R7989 Other specified abnormal findings of blood chemistry: Secondary | ICD-10-CM

## 2021-04-10 DIAGNOSIS — K8050 Calculus of bile duct without cholangitis or cholecystitis without obstruction: Secondary | ICD-10-CM

## 2021-04-10 LAB — HEPATIC FUNCTION PANEL
ALT: 32 U/L (ref 0–35)
AST: 54 U/L — ABNORMAL HIGH (ref 0–37)
Albumin: 3.5 g/dL (ref 3.5–5.2)
Alkaline Phosphatase: 393 U/L — ABNORMAL HIGH (ref 39–117)
Bilirubin, Direct: 0.9 mg/dL — ABNORMAL HIGH (ref 0.0–0.3)
Total Bilirubin: 1.9 mg/dL — ABNORMAL HIGH (ref 0.2–1.2)
Total Protein: 7.4 g/dL (ref 6.0–8.3)

## 2021-04-10 MED ORDER — DICYCLOMINE HCL 10 MG PO CAPS: 10.0000 mg | ORAL_CAPSULE | Freq: Two times a day (BID) | ORAL | 0 refills | Status: DC

## 2021-04-10 NOTE — Progress Notes (Signed)
[facility] Progress Note  Chief Complaint: Epigastric pain and choledocholithiasis  Subjective  History: [name] follows up after recent hospital stay for acute abdominal pain with markedly elevated LFTs and clinical suspicion for choledocholithiasis and possibly cholangitis. She was initially placed on antibiotics, underwent inpatient ERCP  with clear bile found, no stones seen, balloon sweep performed no stent placed.  Antibiotics were discontinued and patient discharged home.  Clinical suspicion is that she may have either passed a stone or had nonobstructing ascending cholangitis (the latter considered less likely without purulent bile and significant improvement in WBC) As with a similar episode that occurred in May of this year, her post hospital LFTs remain significantly elevated and are quite slow to improve.  [name] is feeling much better than when she arrived to the hospital.  She still has a postprandial lower sternal/high epigastric squeezing or tight pain most times that she eats or drinks anything.  She has not had nausea vomiting dysphagia.  No fevers or chills.  This pain has not been exertional.  Is the same pain she had that brought her to the hospital, and apparently brought her to the ED about 2 weeks prior with what was felt may have been a cardiac cause but ruled out for acute coronary syndrome (02/26/2021).  ROS:  Respiratory: no dyspnea Weight stable The patient's Past Medical, Family and Social History were reviewed and are on file in the EMR.  Objective:  Med list reviewed  Current Outpatient Medications:    dicyclomine (BENTYL) 10 MG capsule, Take 1 capsule (10 mg total) by mouth 2 (two) times daily before a meal., Disp: 30 capsule, Rfl: 0   flecainide (TAMBOCOR) 100 MG tablet, Take 1 tablet (100 mg total) by mouth 2 (two) times daily., Disp: 180 tablet, Rfl: 3   omeprazole (PRILOSEC) 10 MG capsule, Take 10 mg by mouth daily as needed (acid). , Disp:  , Rfl:    warfarin (COUMADIN) 5 MG tablet, TAKE 1 to 2 TABLETS BY MOUTH ONCE DAILY OR AS  DIRECTED  BY  COUMADIN  CLINIC (Patient taking differently: Take 5 mg by mouth daily.), Disp: 90 tablet, Rfl: 1   Vital signs in last 24 hrs: Vitals:   04/10/21 1448  BP: (!) 162/82  Pulse: 68  SpO2: 100%   Wt Readings from Last 3 Encounters:  04/10/21 204 lb 4 oz (92.6 kg)  03/13/21 199 lb (90.3 kg)  11/17/20 218 lb (98.9 kg)    Physical Exam  Well-appearing HEENT: sclera anicteric, oral mucosa moist without lesions Neck: supple, no thyromegaly, JVD or lymphadenopathy Cardiac: RRR without murmurs, S1S2 heard, no peripheral edema Pulm: clear to auscultation bilaterally, normal RR and effort noted Abdomen: soft, mild epigastric tenderness, with active bowel sounds. No guarding or palpable hepatosplenomegaly. Skin; warm and dry, no jaundice or rash  Labs:  CMP Latest Ref Rng & Units 03/28/2021 03/16/2021 03/15/2021  Glucose 70 - 99 mg/dL - 116(H) 89  BUN 8 - 23 mg/dL - 19 14  Creatinine 0.44 - 1.00 mg/dL - 1.10(H) 1.27(H)  Sodium 135 - 145 mmol/L - 136 137  Potassium 3.5 - 5.1 mmol/L - 4.6 3.9  Chloride 98 - 111 mmol/L - 102 103  CO2 22 - 32 mmol/L - 20(L) 24  Calcium 8.9 - 10.3 mg/dL - 8.6(L) 8.6(L)  Total Protein 6.0 - 8.3 g/dL 6.9 6.4(L) 6.0(L)  Total Bilirubin 0.2 - 1.2 mg/dL 4.4(H) 7.5(H) 5.1(H)  Alkaline Phos 39 - 117 U/L 691(H) 511(H) 524(H)  AST 0 - 37 U/L 104(H) 106(H) 137(H)  ALT 0 - 35 U/L 61(H) 82(H) 97(H)    ___________________________________________ Radiologic studies:   ____________________________________________ Other:   _____________________________________________ Assessment & Plan  Assessment: Encounter Diagnoses  Name Primary?   Epigastric pain Yes   LFTs abnormal    Choledocholithiasis    Not clear why she continues to have abdominal pain similar to what brought her in with choledocholithiasis.  She most likely passed a biliary stone during the time  of admission and ERCP (had to wait a couple of days for INR to improve on Coumadin washout).  Also she continues to have protracted elevation of LFTs for unclear reasons, similar to what occurred in May of this year under similar circumstances. I wonder if antibiotics might also have been contributing to that.  Recheck hepatic function panel.  Probably not back to normal yet, will need recheck a couple weeks later.  When her LFTs are at or near normal, I will try her on ursodiol to hopefully reduce the chance of recurrent CBD stones.  Nature of medicine and rationale for using it explained.  I do not believe it interacts with warfarin, but will be sure to check before prescribing it.  Lastly, I gave her a 2-week trial of dicyclomine 10 mg twice daily before meals in case there is some intestinal spasm contributing to this pain.  32 minutes were spent on this encounter (including chart review, history/exam, counseling/coordination of care, and documentation) > 50% of that time was spent on counseling and coordination of care.   Nelida Meuse III

## 2021-04-10 NOTE — Patient Instructions (Signed)
If you are age 74 or older, your body mass index should be between 23-30. Your Body mass index is 41.25 kg/m. If this is out of the aforementioned range listed, please consider follow up with your Primary Care Provider.  If you are age 75 or younger, your body mass index should be between 19-25. Your Body mass index is 41.25 kg/m. If this is out of the aformentioned range listed, please consider follow up with your Primary Care Provider.   ________________________________________________________  The [facility] providers would like to encourage you to use [facility] to communicate with providers for non-urgent requests or questions.  Due to long hold times on the telephone, sending your provider a message by [facility] may be a faster and more efficient way to get a response.  Please allow 48 business hours for a response.  Please remember that this is for non-urgent requests.  _______________________________________________________  Your provider has requested that you go to the basement level for lab work before leaving today. Press "B" on the elevator. The lab is located at the first door on the left as you exit the elevator.  Due to recent changes in healthcare laws, you may see the results of your imaging and laboratory studies on MyChart before your provider has had a chance to review them.  We understand that in some cases there may be results that are confusing or concerning to you. Not all laboratory results come back in the same time frame and the provider may be waiting for multiple results in order to interpret others.  Please give [place] 48 hours in order for your provider to thoroughly review all the results before contacting the office for clarification of your results.   It was a pleasure to see you today!  Thank you for trusting me with your gastrointestinal care!

## 2021-04-11 ENCOUNTER — Other Ambulatory Visit: Payer: Self-pay

## 2021-04-11 DIAGNOSIS — E039 Hypothyroidism, unspecified: Secondary | ICD-10-CM | POA: Diagnosis not present

## 2021-04-11 DIAGNOSIS — K8050 Calculus of bile duct without cholangitis or cholecystitis without obstruction: Secondary | ICD-10-CM

## 2021-04-11 DIAGNOSIS — Z23 Encounter for immunization: Secondary | ICD-10-CM | POA: Diagnosis not present

## 2021-04-11 DIAGNOSIS — I4891 Unspecified atrial fibrillation: Secondary | ICD-10-CM | POA: Diagnosis not present

## 2021-04-11 DIAGNOSIS — J45909 Unspecified asthma, uncomplicated: Secondary | ICD-10-CM | POA: Diagnosis not present

## 2021-04-11 DIAGNOSIS — K219 Gastro-esophageal reflux disease without esophagitis: Secondary | ICD-10-CM | POA: Diagnosis not present

## 2021-04-11 DIAGNOSIS — R1013 Epigastric pain: Secondary | ICD-10-CM

## 2021-04-11 DIAGNOSIS — I10 Essential (primary) hypertension: Secondary | ICD-10-CM | POA: Diagnosis not present

## 2021-04-11 DIAGNOSIS — Z0000 Encounter for general adult medical examination without abnormal findings: Secondary | ICD-10-CM | POA: Diagnosis not present

## 2021-04-11 DIAGNOSIS — R7989 Other specified abnormal findings of blood chemistry: Secondary | ICD-10-CM

## 2021-04-11 DIAGNOSIS — Z1389 Encounter for screening for other disorder: Secondary | ICD-10-CM | POA: Diagnosis not present

## 2021-04-11 DIAGNOSIS — D6869 Other thrombophilia: Secondary | ICD-10-CM | POA: Diagnosis not present

## 2021-04-18 DIAGNOSIS — Z1211 Encounter for screening for malignant neoplasm of colon: Secondary | ICD-10-CM | POA: Diagnosis not present

## 2021-04-18 LAB — IFOBT (OCCULT BLOOD): IFOBT: POSITIVE

## 2021-04-19 ENCOUNTER — Ambulatory Visit (INDEPENDENT_AMBULATORY_CARE_PROVIDER_SITE_OTHER): Payer: Medicare PPO

## 2021-04-19 ENCOUNTER — Other Ambulatory Visit: Payer: Self-pay

## 2021-04-19 DIAGNOSIS — I480 Paroxysmal atrial fibrillation: Secondary | ICD-10-CM | POA: Diagnosis not present

## 2021-04-19 DIAGNOSIS — Z5181 Encounter for therapeutic drug level monitoring: Secondary | ICD-10-CM

## 2021-04-19 LAB — POCT INR: INR: 1.5 — AB (ref 2.0–3.0)

## 2021-04-19 NOTE — Patient Instructions (Signed)
TAKE 2 TABLETS TODAY and then Continue taking 5 mg daily. Repeat 3 weeks [phone]

## 2021-04-23 ENCOUNTER — Telehealth: Payer: Self-pay | Admitting: Gastroenterology

## 2021-04-23 DIAGNOSIS — R195 Other fecal abnormalities: Secondary | ICD-10-CM

## 2021-04-23 NOTE — Telephone Encounter (Signed)
[place],   we received a fax from this patient's primary care provider at [facility] internal medicine showing a positive FIT test for stool occult blood.  This appears to have been done for colon cancer screening, the patient's last colonoscopy was in September 2007 (at this practice, unless she has had one at another practice since then).  Her PCP is requesting a colonoscopy to work-up this occult blood in the stool.  Dawna was recently seen in the office after hospitalization for recurrent bile duct stones.  If she is agreeable to a colonoscopy and ready to schedule that, it can be directly booked with me in the [facility]. She is on Coumadin for atrial fibrillation, LVEF normal on last echocardiogram in 2018.  I have copied this to her cardiologist because if they are agreeable to Makaylynn being off Coumadin for 4 days prior to the colonoscopy, the procedure can be directly booked with me without an office visit. Of note, she tolerated being off Coumadin several days for her inpatient ERCP in December of this year in May of last year.  ________________________  Dr. Percival Spanish,  Please see above, and we would greatly appreciate your input on a brief hold of Coumadin for this patient prior to an outpatient colonoscopy.  Thank you very much.  Wilfrid Lund, MD [facility]

## 2021-04-24 MED ORDER — NA SULFATE-K SULFATE-MG SULF 17.5-3.13-1.6 GM/177ML PO SOLN: 1.0000 | ORAL | 0 refills | Status: DC

## 2021-04-24 NOTE — Telephone Encounter (Signed)
Request is to hold Coumadin, response gave the OK to hold Eliquis. Which therapy is patient currently on? Coumadin is listed on medication list. Thanks in advance.

## 2021-04-24 NOTE — Telephone Encounter (Addendum)
° °  Primary Cardiologist: Minus Breeding, MD  Chart reviewed as part of pre-operative protocol coverage. Given past medical history and time since last visit, based on ACC/AHA guidelines, [name] would be at acceptable risk for the planned procedure without further cardiovascular testing.   Her Coumadin may be held for 4 days prior to her procedure.  Please resume as soon as hemostasis is achieved.  I will route this recommendation to the requesting party via Epic fax function and remove from pre-op pool.  Please call with questions.  [name]. Jessica Ruland NP-C    04/24/2021, 12:23 PM [place] [address] Office [phone] Fax [phone]

## 2021-04-24 NOTE — Telephone Encounter (Signed)
The Eliquis is a typo.  This patient is on Coumadin, so it can be held 4 days prior to the procedure as indicated by the anticoagulation clinic.  HD

## 2021-04-24 NOTE — Addendum Note (Signed)
Addended by: Yevette Edwards on: 04/24/2021 03:25 PM   Modules accepted: Orders

## 2021-04-24 NOTE — Telephone Encounter (Signed)
Preoperative team, please contact requesting office and ask for clarification on their question about Eliquis hold.  Request was for holding for days and request was granted.  Thank you for your help.  Jossie Ng. Titilayo Hagans NP-C    04/24/2021, 1:11 PM [facility] [place] [address] Office [phone] Fax [phone]

## 2021-04-24 NOTE — Telephone Encounter (Signed)
Called and spoke with patient to schedule colonoscopy appt. Pt has been scheduled for a colonoscopy on Thursday, 06/07/21 at 8:30 am. Patient is aware that she will need to arrive at 7:30 am with a care partner. Patient is aware that I will send in prep today and she will need to go by pharmacy to pick it up within the next few days. She knows that she will receive her instructions in the mail and via my chart. Pt is aware that we have the OK to hold Coumadin 4 days prior to her procedure. Pt verbalized understanding and had no concerns at the end of the call.  Ambulatory referral to GI in epic. Suprep sent to patient's pharmacy.

## 2021-04-26 ENCOUNTER — Other Ambulatory Visit: Payer: Self-pay | Admitting: Cardiology

## 2021-05-01 ENCOUNTER — Telehealth: Payer: Self-pay

## 2021-05-01 ENCOUNTER — Other Ambulatory Visit (INDEPENDENT_AMBULATORY_CARE_PROVIDER_SITE_OTHER): Payer: Medicare PPO

## 2021-05-01 DIAGNOSIS — R1013 Epigastric pain: Secondary | ICD-10-CM | POA: Diagnosis not present

## 2021-05-01 DIAGNOSIS — K8050 Calculus of bile duct without cholangitis or cholecystitis without obstruction: Secondary | ICD-10-CM

## 2021-05-01 DIAGNOSIS — R7989 Other specified abnormal findings of blood chemistry: Secondary | ICD-10-CM

## 2021-05-01 LAB — HEPATIC FUNCTION PANEL
ALT: 16 U/L (ref 0–35)
AST: 27 U/L (ref 0–37)
Albumin: 3.6 g/dL (ref 3.5–5.2)
Alkaline Phosphatase: 204 U/L — ABNORMAL HIGH (ref 39–117)
Bilirubin, Direct: 0.3 mg/dL (ref 0.0–0.3)
Total Bilirubin: 0.9 mg/dL (ref 0.2–1.2)
Total Protein: 7.4 g/dL (ref 6.0–8.3)

## 2021-05-01 NOTE — Telephone Encounter (Signed)
Spoke with patient to remind her that she is due for repeat labs at this time. No appointment is necessary. Patient is aware that she can stop by the lab in the basement at her convenience between 7:30 AM - 5 PM, Monday through Friday. Patient verbalized understanding and had no concerns at the end of the call.   

## 2021-05-01 NOTE — Telephone Encounter (Signed)
-----   Message from Yevette Edwards, RN sent at 04/25/2021  7:52 AM EST ----- Regarding: FW: Lab  ----- Message ----- From: Yevette Edwards, RN Sent: 04/25/2021  12:00 AM EST To: Yevette Edwards, RN Subject: Lab                                            Hepatic function panel, the order is in epic

## 2021-05-02 ENCOUNTER — Other Ambulatory Visit: Payer: Self-pay

## 2021-05-02 ENCOUNTER — Telehealth: Payer: Self-pay | Admitting: Gastroenterology

## 2021-05-02 MED ORDER — URSODIOL 300 MG PO CAPS: ORAL_CAPSULE | ORAL | 3 refills | Status: DC

## 2021-05-02 MED ORDER — NA SULFATE-K SULFATE-MG SULF 17.5-3.13-1.6 GM/177ML PO SOLN: 1.0000 | ORAL | 0 refills | Status: AC

## 2021-05-02 NOTE — Telephone Encounter (Signed)
Patient called said her insurance does not cover the Plenvu seeking an alternative.

## 2021-05-03 MED ORDER — GOLYTELY 236 G PO SOLR: 4000.0000 mL | Freq: Once | ORAL | 0 refills | Status: AC

## 2021-05-03 NOTE — Telephone Encounter (Signed)
New prep instructions for Golytely have been sent with mychart and a physical copy has been mailed to the home address on file. Golytely has been sent to the pharmacy on 05-03-2021.

## 2021-05-12 NOTE — Progress Notes (Signed)
\    Cardiology Office Note   Date:  05/14/2021   ID:  Jessica, [name] [birth year], MRN [id number]  PCP:  Kelton Pillar, MD  Cardiologist:   Minus Breeding, MD   Chief Complaint  Patient presents with   Shortness of Breath      History of Present Illness:  [name] is a 76 y.o. female who presents for follow up of atrial fib.   At the last visit her metoprolol was decreased because of bradycardia.  Cozaar was added because of hypertension.  Since we last saw her she did have a treadmill test just to follow-up for flecainide treatment.  There were no inducible arrhythmias or EKG changes.  It was a submaximal test as she did not reach the target heart rate.  There is no evidence of ischemia.   She was recently seen prior to GI procedure for choledocholithiasis.  She presents for follow-up.  She has had no new cardiac complaints.  She denies new chest pressure, neck or arm discomfort.  She has no new palpitations reminiscent of her atrial fibrillation.  She feels occasional skipped beats.  She has some dizziness when standing.  This really is unchanged and she has not frank syncope.  She does feel chronically short of breath with activities.  This has been relatively unchanged and she is not describing PND or orthopnea.  She is not having any lower extremity swelling.     Past Medical History:  Diagnosis Date   Borderline hyperlipidemia    Borderline hypertension    Complication of anesthesia    PONV   Difficult intubation    Hypertension    Hypothyroid    s/p PTU therapy   Hypothyroidism 09/15/2008   Qualifier: Diagnosis of  By: Percival Spanish, MD, Farrel Gordon     PAF (paroxysmal atrial fibrillation) [facility])     Past Surgical History:  Procedure Laterality Date   BILIARY DILATION  07/14/2020   Procedure: BILIARY DILATION;  Surgeon: Milus Banister, MD;  Location: [facility] ENDOSCOPY;  Service: Endoscopy;;   CESAREAN SECTION     CHOLECYSTECTOMY     ENDOSCOPIC RETROGRADE  CHOLANGIOPANCREATOGRAPHY (ERCP) WITH PROPOFOL N/A 07/14/2020   Procedure: ENDOSCOPIC RETROGRADE CHOLANGIOPANCREATOGRAPHY (ERCP) WITH PROPOFOL;  Surgeon: Milus Banister, MD;  Location: [facility] ENDOSCOPY;  Service: Endoscopy;  Laterality: N/A;   ENDOSCOPIC RETROGRADE CHOLANGIOPANCREATOGRAPHY (ERCP) WITH PROPOFOL N/A 03/15/2021   Procedure: ENDOSCOPIC RETROGRADE CHOLANGIOPANCREATOGRAPHY (ERCP) WITH PROPOFOL;  Surgeon: Gatha Mayer, MD;  Location: [place];  Service: Endoscopy;  Laterality: N/A;   ESOPHAGOGASTRODUODENOSCOPY (EGD) WITH PROPOFOL N/A 07/14/2020   Procedure: ESOPHAGOGASTRODUODENOSCOPY (EGD) WITH PROPOFOL;  Surgeon: Milus Banister, MD;  Location: [facility] ENDOSCOPY;  Service: Endoscopy;  Laterality: N/A;   KNEE SURGERY     right 2014, left 2015, arthroscipic   LEG SURGERY     Left leg d/t  tumor (benign)   REMOVAL OF STONES  07/14/2020   Procedure: REMOVAL OF STONES;  Surgeon: Milus Banister, MD;  Location: [facility] ENDOSCOPY;  Service: Endoscopy;;   SPHINCTEROTOMY  07/14/2020   Procedure: [name];  Surgeon: Milus Banister, MD;  Location: [place];  Service: Endoscopy;;   THYROID SURGERY     UPPER ESOPHAGEAL ENDOSCOPIC ULTRASOUND (EUS) N/A 07/14/2020   Procedure: UPPER ESOPHAGEAL ENDOSCOPIC ULTRASOUND (EUS);  Surgeon: Milus Banister, MD;  Location[facility] ENDOSCOPY;  Service: Endoscopy;  Laterality: N/A;     Current Outpatient Medications  Medication Sig Dispense Refill   flecainide (TAMBOCOR) 100 MG tablet Take  1 tablet (100 mg total) by mouth 2 (two) times daily. 180 tablet 3   omeprazole (PRILOSEC) 10 MG capsule Take 10 mg by mouth daily as needed (acid).      ursodiol (ACTIGALL) 300 MG capsule Take 2 capsules (600 mg total) by mouth once a daily. 60 capsule 3   warfarin (COUMADIN) 5 MG tablet TAKE 1 TO 2 TABLETS BY MOUTH ONCE DAILY OR  AS  DIRECTED  BY  COUMADIN  CLINIC 90 tablet 0   dicyclomine (BENTYL) 10 MG capsule Take 1 capsule (10 mg total) by mouth 2 (two) times daily before  a meal. (Patient not taking: Reported on 05/14/2021) 30 capsule 0   No current facility-administered medications for this visit.    Allergies:   Chocolate, Beef-derived products, Cat hair extract, Erythromycin, Fish allergy, Other, Phenergan [promethazine], Pork-derived products, Shellfish allergy, Soybean extract allergy skin test, Strawberry extract, Naproxen sodium, Penicillins, and Promethazine hcl    ROS:  Please see the history of present illness.   Otherwise, review of systems are positive for none.   All other systems are reviewed and negative.    PHYSICAL EXAM: VS:  BP (!) 165/92    Pulse (!) 54    Ht 4\' 11"  (1.499 m)    Wt 199 lb (90.3 kg)    SpO2 96%    BMI 40.19 kg/m  , BMI Body mass index is 40.19 kg/m. GENERAL:  Well appearing NECK:  No jugular venous distention, waveform within normal limits, carotid upstroke brisk and symmetric, no bruits, no thyromegaly LUNGS:  Clear to auscultation bilaterally CHEST:  Unremarkable HEART:  PMI not displaced or sustained,S1 and S2 within normal limits, no S3, no S4, no clicks, no rubs, no murmurs ABD:  Flat, positive bowel sounds normal in frequency in pitch, no bruits, no rebound, no guarding, no midline pulsatile mass, no hepatomegaly, no splenomegaly EXT:  2 plus pulses throughout, no edema, no cyanosis no clubbing   EKG:  EKG is not ordered today. Normal sinus rhythm, rate  Recent Labs: 07/14/2020: TSH 2.186 03/15/2021: Hemoglobin 11.2; Magnesium 1.9; Platelets 223 03/16/2021: BUN 19; Creatinine, Ser 1.10; Potassium 4.6; Sodium 136 05/01/2021: ALT 16    Lipid Panel    Component Value Date/Time   CHOL  05/27/2008 0700    149        ATP III CLASSIFICATION:  <200     mg/dL   Desirable  200-239  mg/dL   Borderline High  >=240    mg/dL   High          TRIG 54 05/27/2008 0700   HDL 51 05/27/2008 0700   CHOLHDL 2.9 05/27/2008 0700   VLDL 11 05/27/2008 0700   LDLCALC  05/27/2008 0700    87        Total Cholesterol/HDL:CHD  Risk Coronary Heart Disease Risk Table                     Men   Women  1/2 Average Risk   3.4   3.3  Average Risk       5.0   4.4  2 X Average Risk   9.6   7.1  3 X Average Risk  23.4   11.0        Use the calculated Patient Ratio above and the CHD Risk Table to determine the patient's CHD Risk.        ATP III CLASSIFICATION (LDL):  <100     mg/dL  Optimal  100-129  mg/dL   Near or Above                    Optimal  130-159  mg/dL   Borderline  160-189  mg/dL   High  >190     mg/dL   Very High      Wt Readings from Last 3 Encounters:  05/14/21 199 lb (90.3 kg)  04/10/21 204 lb 4 oz (92.6 kg)  03/13/21 199 lb (90.3 kg)      Other studies Reviewed: Additional studies/ records that were reviewed today include: Labs Review of the above records demonstrates:  Please see elsewhere in the note.     ASSESSMENT AND PLAN:  Paroxysmal atrial fibrillation:    Ms. TOIYA MORRISH has a CHA2DS2 - VASc score of 4.   She tolerates anticoagulation.  She has not had symptomatic sustained tacky arrhythmias.   Of note she is on unopposed flecainide without AV nodal blocking agents because of significant orthostasis in the past.  Essential hypertension:  Her blood pressure is elevated supine but she has a significant drop when she stands.  I am going to allow for some permissive hypertension while lying down.  Dizziness:   This was related to orthostasis.  I did have her wear a monitor after the last visit there were no sustained bradycardia arrhythmias.  She is cautioned on moving slowly and precautions but also mechanical support to prevent significant blood pressure drops.   SOB: She has had well-preserved ejection fraction previously.  I will check a BNP level.  She has some mild anemia but labs of otherwise been essentially unchanged and these were done late last year earlier this year.  I will check PFTs.  There was some drop in her oxygen saturation with walking on the office but she  was more short of breath this strip suggests.  If these are unremarkable I will have a low threshold for ischemia work-up.  She previously had had a submaximal POET (Plain Old Exercise Treadmill) with very poor exercise tolerance and would likely need a perfusion pharmacologic study.   Current medicines are reviewed at length with the patient today.  The patient does not have concerns regarding medicines.  The following changes have been made: None  Labs/ tests ordered today include:  Orders Placed This Encounter  Procedures   Brain natriuretic peptide   Pulmonary Function Test     Disposition:   FU with APP in about 3 months.    Signed, Minus Breeding, MD  05/14/2021 8:38 AM    [facility]

## 2021-05-14 ENCOUNTER — Ambulatory Visit (INDEPENDENT_AMBULATORY_CARE_PROVIDER_SITE_OTHER): Payer: Medicare PPO

## 2021-05-14 ENCOUNTER — Other Ambulatory Visit: Payer: Self-pay

## 2021-05-14 ENCOUNTER — Ambulatory Visit: Payer: Medicare PPO | Admitting: Cardiology

## 2021-05-14 ENCOUNTER — Encounter: Payer: Self-pay | Admitting: Cardiology

## 2021-05-14 ENCOUNTER — Telehealth: Payer: Self-pay | Admitting: Gastroenterology

## 2021-05-14 VITALS — BP 165/92 | HR 54 | Ht 59.0 in | Wt 199.0 lb

## 2021-05-14 DIAGNOSIS — I480 Paroxysmal atrial fibrillation: Secondary | ICD-10-CM

## 2021-05-14 DIAGNOSIS — I10 Essential (primary) hypertension: Secondary | ICD-10-CM

## 2021-05-14 DIAGNOSIS — R42 Dizziness and giddiness: Secondary | ICD-10-CM | POA: Diagnosis not present

## 2021-05-14 DIAGNOSIS — Z5181 Encounter for therapeutic drug level monitoring: Secondary | ICD-10-CM

## 2021-05-14 LAB — POCT INR: INR: 1.7 — AB (ref 2.0–3.0)

## 2021-05-14 NOTE — Telephone Encounter (Signed)
Returned call to patient. She states that the MD at the Coumadin clinic wanted to verify that she was cleared to hold her coumadin. I told her that we received the OK from Dr. Percival Spanish. I told her that the note is in epic from 04/23/21 if the MD at the Coumadin clinic would like to review. I told her that there was no mention of a Lovenox bridge. Pt states that the she will let the Coumadin MD at her appt in 2 weeks. Pt verbalized understanding and had no concerns at the end of the call.

## 2021-05-14 NOTE — Patient Instructions (Addendum)
TAKE 2 TABLETS TODAY and then increase to 1 tablet Daily, except 1.5 tablets on Wednesday.  Repeat 2 weeks [phone]

## 2021-05-14 NOTE — Telephone Encounter (Signed)
Patient called and stated that she needed a surgical release form for her procedure with Dr. Loletha Carrow 3/2. Seeking advice, please advise.

## 2021-05-14 NOTE — Patient Instructions (Signed)
Medication Instructions:  Your physician recommends that you continue on your current medications as directed. Please refer to the Current Medication list given to you today.  *If you need a refill on your cardiac medications before your next appointment, please call your pharmacy*   Lab Work: Your physician recommends that you have labs drawn today: BNP  If you have labs (blood work) drawn today and your tests are completely normal, you will receive your results only by: [place] (if you have MyChart) OR A paper copy in the mail If you have any lab test that is abnormal or we need to change your treatment, we will call you to review the results.   Testing/Procedures: Your physician has recommended that you have a pulmonary function test. Pulmonary Function Tests are a group of tests that measure how well air moves in and out of your lungs.    Follow-Up: At [facility], you and your health needs are our priority.  As part of our continuing mission to provide you with exceptional heart care, we have created designated Provider Care Teams.  These Care Teams include your primary Cardiologist (physician) and Advanced Practice Providers (APPs -  Physician Assistants and Nurse Practitioners) who all work together to provide you with the care you need, when you need it.  We recommend signing up for the patient portal called "MyChart".  Sign up information is provided on this After Visit Summary.  MyChart is used to connect with patients for Virtual Visits (Telemedicine).  Patients are able to view lab/test results, encounter notes, upcoming appointments, etc.  Non-urgent messages can be sent to your provider as well.   To learn more about what you can do with MyChart, go to [email or web address].    Your next appointment:   3 month(s)  The format for your next appointment:   In Person  Provider:   Coletta Memos, FNP, Fabian Sharp, PA-C, Sande Rives, PA-C, Caron Presume,  PA-C, Jory Sims, DNP, ANP, or Almyra Deforest, PA-C

## 2021-05-15 LAB — BRAIN NATRIURETIC PEPTIDE: BNP: 280 pg/mL — ABNORMAL HIGH (ref 0.0–100.0)

## 2021-05-18 ENCOUNTER — Other Ambulatory Visit: Payer: Self-pay | Admitting: Family Medicine

## 2021-05-18 DIAGNOSIS — Z1231 Encounter for screening mammogram for malignant neoplasm of breast: Secondary | ICD-10-CM

## 2021-05-21 ENCOUNTER — Telehealth: Payer: Self-pay | Admitting: *Deleted

## 2021-05-21 ENCOUNTER — Encounter: Payer: Self-pay | Admitting: *Deleted

## 2021-05-21 DIAGNOSIS — R0602 Shortness of breath: Secondary | ICD-10-CM

## 2021-05-21 NOTE — Telephone Encounter (Signed)
Spoke with pt, lexiscan ordered and scheduled. Instruction letter sent to patient via my chart

## 2021-05-21 NOTE — Telephone Encounter (Signed)
-----   Message from Minus Breeding, MD sent at 05/20/2021  3:26 PM EST ----- BNP is mildly elevated.  I am not convinced that HF is the cause of her SOB.  Please order a Lexiscan Myoview. Call Ms. Schwarz with the results and send results to Kelton Pillar, MD

## 2021-05-22 ENCOUNTER — Telehealth (HOSPITAL_COMMUNITY): Payer: Self-pay

## 2021-05-22 NOTE — Telephone Encounter (Signed)
Detailed instructions left on the patient's answering machine. Asked to call back with any questions. S.Shanvi Moyd EMTP 

## 2021-05-24 ENCOUNTER — Other Ambulatory Visit: Payer: Self-pay

## 2021-05-24 ENCOUNTER — Ambulatory Visit (HOSPITAL_COMMUNITY): Payer: Medicare PPO | Attending: Cardiovascular Disease

## 2021-05-24 DIAGNOSIS — R0602 Shortness of breath: Secondary | ICD-10-CM | POA: Diagnosis not present

## 2021-05-24 LAB — MYOCARDIAL PERFUSION IMAGING
LV dias vol: 65 mL (ref 46–106)
LV sys vol: 24 mL
Nuc Stress EF: 63 %
Peak HR: 86 {beats}/min
Rest HR: 56 {beats}/min
Rest Nuclear Isotope Dose: 10.8 mCi
SDS: 1
SRS: 0
SSS: 1
Stress Nuclear Isotope Dose: 32.3 mCi
TID: 0.86

## 2021-05-24 MED ORDER — REGADENOSON 0.4 MG/5ML IV SOLN
0.4000 mg | Freq: Once | INTRAVENOUS | Status: AC
  Administered 2021-05-24: 0.4 mg via INTRAVENOUS

## 2021-05-24 MED ORDER — TECHNETIUM TC 99M TETROFOSMIN IV KIT
32.3000 | PACK | Freq: Once | INTRAVENOUS | Status: AC | PRN
  Administered 2021-05-24: 32.3 via INTRAVENOUS
  Filled 2021-05-24: qty 33

## 2021-05-24 MED ORDER — TECHNETIUM TC 99M TETROFOSMIN IV KIT
10.8000 | PACK | Freq: Once | INTRAVENOUS | Status: AC | PRN
  Administered 2021-05-24: 10.8 via INTRAVENOUS
  Filled 2021-05-24: qty 11

## 2021-05-28 ENCOUNTER — Other Ambulatory Visit: Payer: Self-pay

## 2021-05-28 ENCOUNTER — Ambulatory Visit (INDEPENDENT_AMBULATORY_CARE_PROVIDER_SITE_OTHER): Payer: Medicare PPO

## 2021-05-28 DIAGNOSIS — I480 Paroxysmal atrial fibrillation: Secondary | ICD-10-CM

## 2021-05-28 DIAGNOSIS — Z5181 Encounter for therapeutic drug level monitoring: Secondary | ICD-10-CM

## 2021-05-28 LAB — POCT INR: INR: 1.8 — AB (ref 2.0–3.0)

## 2021-05-28 NOTE — Patient Instructions (Signed)
TAKE 2 TABLETS TODAY and then increase to 1 tablet Daily, except 1.5 tablets on Wednesday.  Repeat 2 weeks [phone];  HOLDING for Colonoscopy 3/2;

## 2021-06-01 ENCOUNTER — Encounter: Payer: Self-pay | Admitting: Gastroenterology

## 2021-06-06 ENCOUNTER — Encounter: Payer: Self-pay | Admitting: Certified Registered Nurse Anesthetist

## 2021-06-07 ENCOUNTER — Ambulatory Visit (AMBULATORY_SURGERY_CENTER): Payer: Medicare PPO | Admitting: Gastroenterology

## 2021-06-07 ENCOUNTER — Other Ambulatory Visit: Payer: Self-pay

## 2021-06-07 ENCOUNTER — Encounter: Payer: Self-pay | Admitting: Gastroenterology

## 2021-06-07 VITALS — BP 115/55 | HR 50 | Temp 98.1°F | Resp 11 | Ht 59.0 in | Wt 204.0 lb

## 2021-06-07 DIAGNOSIS — R7989 Other specified abnormal findings of blood chemistry: Secondary | ICD-10-CM

## 2021-06-07 DIAGNOSIS — K6389 Other specified diseases of intestine: Secondary | ICD-10-CM

## 2021-06-07 DIAGNOSIS — Z1211 Encounter for screening for malignant neoplasm of colon: Secondary | ICD-10-CM | POA: Diagnosis not present

## 2021-06-07 DIAGNOSIS — R195 Other fecal abnormalities: Secondary | ICD-10-CM

## 2021-06-07 DIAGNOSIS — D120 Benign neoplasm of cecum: Secondary | ICD-10-CM

## 2021-06-07 DIAGNOSIS — C182 Malignant neoplasm of ascending colon: Secondary | ICD-10-CM | POA: Diagnosis not present

## 2021-06-07 DIAGNOSIS — I10 Essential (primary) hypertension: Secondary | ICD-10-CM | POA: Diagnosis not present

## 2021-06-07 MED ORDER — SODIUM CHLORIDE 0.9 % IV SOLN: 500.0000 mL | Freq: Once | INTRAVENOUS | Status: DC

## 2021-06-07 NOTE — Progress Notes (Signed)
Called to room to assist during endoscopic procedure.  Patient ID and intended procedure confirmed with present staff. Received instructions for my participation in the procedure from the performing physician.  

## 2021-06-07 NOTE — Progress Notes (Signed)
Report given to PACU, vss 

## 2021-06-07 NOTE — Progress Notes (Signed)
History and Physical: ? This patient presents for endoscopic testing for: ?Encounter Diagnosis  ?Name Primary?  ? Positive FIT (fecal immunochemical test) Yes  ? ? ?Last colonoscopy 2007 ?Recurrent Choledocholithiasis - see 04/10/21 office note for details. ?Has been off coumadin 4 days for this procedure, has A fib ?Patient denies chronic abdominal pain, rectal bleeding, constipation or diarrhea. ? ?ROS: ?Patient denies chest pain or shortness of breath ? ? ?Past Medical History: ?Past Medical History:  ?Diagnosis Date  ? Borderline hyperlipidemia   ? Borderline hypertension   ? Complication of anesthesia   ? PONV  ? Difficult intubation   ? Hypertension   ? Hypothyroid   ? s/p PTU therapy  ? Hypothyroidism 09/15/2008  ? Qualifier: Diagnosis of  By: Percival Spanish, MD, Farrel Gordon    ? PAF (paroxysmal atrial fibrillation) ([place])   ? ? ? ?Past Surgical History: ?Past Surgical History:  ?Procedure Laterality Date  ? BILIARY DILATION  07/14/2020  ? Procedure: BILIARY DILATION;  Surgeon: Milus Banister, MD;  Location: [facility] ENDOSCOPY;  Service: Endoscopy;;  ? CESAREAN SECTION    ? CHOLECYSTECTOMY    ? ENDOSCOPIC RETROGRADE CHOLANGIOPANCREATOGRAPHY (ERCP) WITH PROPOFOL N/A 07/14/2020  ? Procedure: ENDOSCOPIC RETROGRADE CHOLANGIOPANCREATOGRAPHY (ERCP) WITH PROPOFOL;  Surgeon: Milus Banister, MD;  Location: [facility] ENDOSCOPY;  Service: Endoscopy;  Laterality: N/A;  ? ENDOSCOPIC RETROGRADE CHOLANGIOPANCREATOGRAPHY (ERCP) WITH PROPOFOL N/A 03/15/2021  ? Procedure: ENDOSCOPIC RETROGRADE CHOLANGIOPANCREATOGRAPHY (ERCP) WITH PROPOFOL;  Surgeon: Gatha Mayer, MD;  Location: [facility] ENDOSCOPY;  Service: Endoscopy;  Laterality: N/A;  ? ESOPHAGOGASTRODUODENOSCOPY (EGD) WITH PROPOFOL N/A 07/14/2020  ? Procedure: ESOPHAGOGASTRODUODENOSCOPY (EGD) WITH PROPOFOL;  Surgeon: Milus Banister, MD;  Location: [facility] ENDOSCOPY;  Service: Endoscopy;  Laterality: N/A;  ? KNEE SURGERY    ? right 2014, left 2015, arthroscipic  ? LEG SURGERY    ? Left leg d/t  tumor  (benign)  ? REMOVAL OF STONES  07/14/2020  ? Procedure: REMOVAL OF STONES;  Surgeon: Milus Banister, MD;  Location: [facility] ENDOSCOPY;  Service: Endoscopy;;  ? SPHINCTEROTOMY  07/14/2020  ? Procedure: SPHINCTEROTOMY;  Surgeon: Milus Banister, MD;  Location: [place];  Service: Endoscopy;;  ? THYROID SURGERY    ? UPPER ESOPHAGEAL ENDOSCOPIC ULTRASOUND (EUS) N/A 07/14/2020  ? Procedure: UPPER ESOPHAGEAL ENDOSCOPIC ULTRASOUND (EUS);  Surgeon: Milus Banister, MD;  Location: [facility] ENDOSCOPY;  Service: Endoscopy;  Laterality: N/A;  ? ? ?Allergies: ?Allergies  ?Allergen Reactions  ? Chocolate Itching  ?  Itching mouth  ? Beef-Derived Products Itching  ? Cat Hair Extract Itching  ? Erythromycin   ?  Other reaction(s): Unknown  ? Fish Allergy Itching  ? Other Itching  ?  white potatoes, corn  ? Phenergan [Promethazine] Other (See Comments)  ?  Pt and family states her nausea worsens with phenergan rather than improving to the point she states she cant have it.   ? Pork-Derived Products Itching  ? Shellfish Allergy   ? Soybean Extract Allergy Skin Test Itching  ? Strawberry Extract Itching  ? Naproxen Sodium Other (See Comments)  ?  Hurt all over  ? Penicillins Rash  ?  Has patient had a PCN reaction causing immediate rash, facial/tongue/throat swelling, SOB or lightheadedness with hypotension: Yes ?Has patient had a PCN reaction causing severe rash involving mucus membranes or skin necrosis: Yes ?Has patient had a PCN reaction that required hospitalization: No ?Has patient had a PCN reaction occurring within the last 10 years: No ?If all of the above answers are "  NO", then may proceed with Cephalosporin use. ?  ? Promethazine Hcl Other (See Comments)  ?  Sick   ? ? ?Outpatient Meds: ?Current Outpatient Medications  ?Medication Sig Dispense Refill  ? flecainide (TAMBOCOR) 100 MG tablet Take 1 tablet (100 mg total) by mouth 2 (two) times daily. 180 tablet 3  ? omeprazole (PRILOSEC) 10 MG capsule Take 10 mg by mouth daily as needed  (acid).     ? ursodiol (ACTIGALL) 300 MG capsule Take 2 capsules (600 mg total) by mouth once a daily. 60 capsule 3  ? dicyclomine (BENTYL) 10 MG capsule Take 1 capsule (10 mg total) by mouth 2 (two) times daily before a meal. (Patient not taking: Reported on 05/14/2021) 30 capsule 0  ? warfarin (COUMADIN) 5 MG tablet TAKE 1 TO 2 TABLETS BY MOUTH ONCE DAILY OR  AS  DIRECTED  BY  COUMADIN  CLINIC 90 tablet 0  ? ?Current Facility-Administered Medications  ?Medication Dose Route Frequency Provider Last Rate Last Admin  ? 0.9 %  sodium chloride infusion  500 mL Intravenous Once Doran Stabler, MD      ? ? ? ? ?___________________________________________________________________ ?Objective  ? ?Exam: ? ?BP (!) 148/69   Pulse 70   Temp 98.1 ?F (36.7 ?C)   Ht 4\' 11"  (1.499 m)   Wt 204 lb (92.5 kg)   SpO2 98%   BMI 41.20 kg/m?  ? ?CV: RRR without murmur, S1/S2 ?Resp: clear to auscultation bilaterally, normal RR and effort noted ?GI: soft, no tenderness, with active bowel sounds. ? ? ?Assessment: ?Encounter Diagnosis  ?Name Primary?  ? Positive FIT (fecal immunochemical test) Yes  ? ? ? ?Plan: ?Colonoscopy ? The benefits and risks of the planned procedure were described in detail with the patient or (when appropriate) their health care proxy.  Risks were outlined as including, but not limited to, bleeding, infection, perforation, adverse medication reaction leading to cardiac or pulmonary decompensation, pancreatitis (if ERCP).  The limitation of incomplete mucosal visualization was also discussed.  No guarantees or warranties were given. ? ? ? ?The patient is appropriate for an endoscopic procedure in the ambulatory setting. ? ? - Wilfrid Lund, MD ? ? ? ? ?

## 2021-06-07 NOTE — Progress Notes (Signed)
Pt's states no medical or surgical changes since previsit or office visit. 

## 2021-06-07 NOTE — Op Note (Signed)
[name] ?Patient Name: [name] ?Procedure Date: 06/07/2021 8:28 AM ?MRN: [id number] ?Endoscopist: Estill Cotta. Loletha Carrow , MD ?Age: 76 ?Referring MD:  ?Date of Birth: 02/05/1946 ?Gender: Female ?Account #: [id number] ?Procedure:                Colonoscopy ?Indications:              Positive fecal immunochemical test ?Medicines:                Monitored Anesthesia Care ?Procedure:                Pre-Anesthesia Assessment: ?                          - Prior to the procedure, a History and Physical  ?                          was performed, and patient medications and  ?                          allergies were reviewed. The patient's tolerance of  ?                          previous anesthesia was also reviewed. The risks  ?                          and benefits of the procedure and the sedation  ?                          options and risks were discussed with the patient.  ?                          All questions were answered, and informed consent  ?                          was obtained. Prior Anticoagulants: The patient has  ?                          taken Coumadin (warfarin), last dose was 4 days  ?                          prior to procedure. ASA Grade Assessment: II - A  ?                          patient with mild systemic disease. After reviewing  ?                          the risks and benefits, the patient was deemed in  ?                          satisfactory condition to undergo the procedure. ?                          After obtaining informed consent, the colonoscope  ?  was passed under direct vision. Throughout the  ?                          procedure, the patient's blood pressure, pulse, and  ?                          oxygen saturations were monitored continuously. The  ?                          Olympus [id number] was introduced through the anus  ?                          and advanced to the the cecum, identified by  ?                          appendiceal orifice  and ileocecal valve. The  ?                          colonoscopy was performed without difficulty. The  ?                          patient tolerated the procedure well. The quality  ?                          of the bowel preparation was good. The ileocecal  ?                          valve, appendiceal orifice, and rectum were  ?                          photographed. ?Scope In: 8:40:56 AM ?Scope Out: 9:11:12 AM ?Scope Withdrawal Time: 0 hours 27 minutes 43 seconds  ?Total Procedure Duration: 0 hours 30 minutes 16 seconds  ?Findings:                 The perianal and digital rectal examinations were  ?                          normal. ?                          Many diverticula were found in the left colon.  ?                          Associated haustral thickening. ?                          An 18-20 mm polyp was found in the ileocecal valve.  ?                          The polyp was multi-lobulated and sessile. The  ?                          polyp was removed with a piecemeal technique using  ?  a hot snare. Resection and retrieval were complete.  ?                          To prevent bleeding post-intervention, two  ?                          hemostatic clips were successfully placed (MR  ?                          conditional). STSC applied to edges of polypectomy  ?                          site. ?                          A 15 mm polyp was found in the mid ascending colon.  ?                          The polyp was sessile with heaped edges and central  ?                          depression. Biopsies were taken with a cold forceps  ?                          for histology. Area was tattooed with an injection  ?                          of 0.5 mL of Spot (carbon black) x 2 on opposite  ?                          walls about 5 cm distal to this polyp. ?                          Internal hemorrhoids were found. ?                          The exam was otherwise without abnormality on  ?                           direct and retroflexion views. ?Complications:            No immediate complications. ?Estimated Blood Loss:     Estimated blood loss was minimal. ?Impression:               - Diverticulosis in the left colon. ?                          - One 18-20 mm polyp at the ileocecal valve,  ?                          removed piecemeal using a hot snare. Resected and  ?                          retrieved. Clips (MR conditional) were placed. ?                          -  One 15 mm polyp in the mid ascending colon.  ?                          Biopsied. Tattooed. ?                          - Internal hemorrhoids. ?                          - The examination was otherwise normal on direct  ?                          and retroflexion views. ?Recommendation:           - Patient has a contact number available for  ?                          emergencies. The signs and symptoms of potential  ?                          delayed complications were discussed with the  ?                          patient. Return to normal activities tomorrow.  ?                          Written discharge instructions were provided to the  ?                          patient. ?                          - Resume previous diet. ?                          - Resume Coumadin (warfarin) at prior dose today.  ?                          Refer to Coumadin Clinic for further adjustment of  ?                          therapy. ?                          - Await pathology results. ?                          - Repeat colonoscopy is recommended for  ?                          surveillance. The colonoscopy date will be  ?                          determined after pathology results from today's  ?                          exam become available for review. ?[name]  Laurian Brim, MD ?06/07/2021 9:21:44 AM ?This report has been signed electronically. ?

## 2021-06-07 NOTE — Patient Instructions (Signed)
Resume previous medications.  Await results for final recommendations.  Handouts on findings given to patient ( polyps and hemorrhoids) ? ?Resume Coumadin today. Refer to the coumadin clinic for any further adjustment.   ? ?YOU HAD AN ENDOSCOPIC PROCEDURE TODAY AT [facility]:   Refer to the procedure report that was given to you for any specific questions about what was found during the examination.  If the procedure report does not answer your questions, please call your gastroenterologist to clarify.  If you requested that your care partner not be given the details of your procedure findings, then the procedure report has been included in a sealed envelope for you to review at your convenience later. ? ?YOU SHOULD EXPECT: Some feelings of bloating in the abdomen. Passage of more gas than usual.  Walking can help get rid of the air that was put into your GI tract during the procedure and reduce the bloating. If you had a lower endoscopy (such as a colonoscopy or flexible sigmoidoscopy) you may notice spotting of blood in your stool or on the toilet paper. If you underwent a bowel prep for your procedure, you may not have a normal bowel movement for a few days. ? ?Please Note:  You might notice some irritation and congestion in your nose or some drainage.  This is from the oxygen used during your procedure.  There is no need for concern and it should clear up in a day or so. ? ?SYMPTOMS TO REPORT IMMEDIATELY: ? ?Following lower endoscopy (colonoscopy or flexible sigmoidoscopy): ? Excessive amounts of blood in the stool ? Significant tenderness or worsening of abdominal pains ? Swelling of the abdomen that is new, acute ? Fever of 100?F or higher ? ? ?For urgent or emergent issues, a gastroenterologist can be reached at any hour by calling [phone]. ?Do not use MyChart messaging for urgent concerns.  ? ? ?DIET:  We do recommend a small meal at first, but then you may proceed to your regular  diet.  Drink plenty of fluids but you should avoid alcoholic beverages for 24 hours. ? ?ACTIVITY:  You should plan to take it easy for the rest of today and you should NOT DRIVE or use heavy machinery until tomorrow (because of the sedation medicines used during the test).   ? ?FOLLOW UP: ?Our staff will call the number listed on your records 48-72 hours following your procedure to check on you and address any questions or concerns that you may have regarding the information given to you following your procedure. If we do not reach you, we will leave a message.  We will attempt to reach you two times.  During this call, we will ask if you have developed any symptoms of COVID 19. If you develop any symptoms (ie: fever, flu-like symptoms, shortness of breath, cough etc.) before then, please call [phone].  If you test positive for Covid 19 in the 2 weeks post procedure, please call and report this information to [place].   ? ?If any biopsies were taken you will be contacted by phone or by letter within the next 1-3 weeks.  Please call us at [phone] if you have not heard about the biopsies in 3 weeks.  ? ? ?SIGNATURES/CONFIDENTIALITY: ?You and/or your care partner have signed paperwork which will be entered into your electronic medical record.  These signatures attest to the fact that that the information above on your After Visit Summary has been reviewed and is  understood.  Full responsibility of the confidentiality of this discharge information lies with you and/or your care-partner.  ?

## 2021-06-07 NOTE — Progress Notes (Signed)
C.W. vital signs. 

## 2021-06-11 ENCOUNTER — Telehealth: Payer: Self-pay | Admitting: *Deleted

## 2021-06-11 ENCOUNTER — Telehealth: Payer: Self-pay | Admitting: Gastroenterology

## 2021-06-11 DIAGNOSIS — R195 Other fecal abnormalities: Secondary | ICD-10-CM

## 2021-06-11 DIAGNOSIS — K6389 Other specified diseases of intestine: Secondary | ICD-10-CM

## 2021-06-11 NOTE — Telephone Encounter (Signed)
Lab orders in epic.  ?1 yr colonoscopy recall in epic. ?CT order in epic. Pt has been scheduled for a CT chest/abd/pelvis at [facility] on Friday, 06/15/21 at 3 pm. Pt will need to arrive at 2:45 pm. Pt is aware that she will need to pick up the 2 bottles of contrast at [facility] within the next day or 2. NPO 4 hours prior to her appt. ?Urgent referral, records, demographic, and patient's insurance information faxed to [facility] at [phone]. ? ?Called and spoke with patient. She is aware that the lab orders have been entered. She is aware of her CT appt and instructions. She is aware that an urgent referral has been faxed to [facility] and they will contact her directly with an appt. Pt is aware that she will need to repeat colonoscopy in 1 year. Pt verbalized understanding and had no concerns at the end of the call. ?

## 2021-06-11 NOTE — Telephone Encounter (Signed)
?  Follow up Call- ? ?Call back number 06/07/2021  ?Post procedure Call Back phone  # [phone]  ?Permission to leave phone message Yes  ?Some recent data might be hidden  ?  ? ?Patient questions: ? ?Do you have a fever, pain , or abdominal swelling? No. ?Pain Score  0 * ? ?Have you tolerated food without any problems? Yes.   ? ?Have you been able to return to your normal activities? Yes.   ? ?Do you have any questions about your discharge instructions: ?Diet   No. ?Medications  No. ?Follow up visit  No. ? ?Do you have questions or concerns about your Care? No. ? ?Actions: ?* If pain score is 4 or above: ?No action needed, pain <4. ? ? ?

## 2021-06-11 NOTE — Telephone Encounter (Signed)
[place], ? ?This patient had a colonoscopy last week and right colon mass biopsied. ? ?I spoke to her this morning with the pathology result.  Polyp I removed was a precancerous polyp, and the mass is cancerous. ? ?We spoke for a while about some further work-up and need for surgical referral, and she was comfortable with the plan. ? ?Please arrange the following: ? ?CBC, CMP and CEA ?(Please check but there may already be an order in for CMP, since the patient was going to go for that last week to check up on elevated LFTs after hospitalization for CBD stones.  I decided not to send her that day after this colonoscopy finding, thinking that she may need additional lab work done after this result.) ? ?CT scan chest abdomen and pelvis with oral and IV contrast -colon cancer staging ? ?Referral to Dr. Nadeen Landau or Leighton Ruff at [facility] for ascending colon cancer. ? ?Recall colonoscopy 1 year ? ?-  -HD ? ?______________________________ ? ?Cyril Mourning, ? ?  No path letter needed. ? ?Patient notified by phone as above. ? ?Please set 1 year colonoscopy recall. ? ?HD ? ? ?

## 2021-06-12 ENCOUNTER — Other Ambulatory Visit (INDEPENDENT_AMBULATORY_CARE_PROVIDER_SITE_OTHER): Payer: Medicare PPO

## 2021-06-12 DIAGNOSIS — R7989 Other specified abnormal findings of blood chemistry: Secondary | ICD-10-CM | POA: Diagnosis not present

## 2021-06-12 DIAGNOSIS — K6389 Other specified diseases of intestine: Secondary | ICD-10-CM

## 2021-06-12 DIAGNOSIS — C189 Malignant neoplasm of colon, unspecified: Secondary | ICD-10-CM | POA: Diagnosis not present

## 2021-06-12 DIAGNOSIS — R195 Other fecal abnormalities: Secondary | ICD-10-CM | POA: Diagnosis not present

## 2021-06-12 LAB — CBC WITH DIFFERENTIAL/PLATELET
Basophils Absolute: 0.1 10*3/uL (ref 0.0–0.1)
Basophils Relative: 1.4 % (ref 0.0–3.0)
Eosinophils Absolute: 0.1 10*3/uL (ref 0.0–0.7)
Eosinophils Relative: 1.7 % (ref 0.0–5.0)
HCT: 35.7 % — ABNORMAL LOW (ref 36.0–46.0)
Hemoglobin: 12 g/dL (ref 12.0–15.0)
Lymphocytes Relative: 33.4 % (ref 12.0–46.0)
Lymphs Abs: 2.9 10*3/uL (ref 0.7–4.0)
MCHC: 33.6 g/dL (ref 30.0–36.0)
MCV: 92 fl (ref 78.0–100.0)
Monocytes Absolute: 0.6 10*3/uL (ref 0.1–1.0)
Monocytes Relative: 7.3 % (ref 3.0–12.0)
Neutro Abs: 4.9 10*3/uL (ref 1.4–7.7)
Neutrophils Relative %: 56.2 % (ref 43.0–77.0)
Platelets: 186 10*3/uL (ref 150.0–400.0)
RBC: 3.88 Mil/uL (ref 3.87–5.11)
RDW: 13.2 % (ref 11.5–15.5)
WBC: 8.8 10*3/uL (ref 4.0–10.5)

## 2021-06-12 LAB — COMPREHENSIVE METABOLIC PANEL
ALT: 11 U/L (ref 0–35)
AST: 18 U/L (ref 0–37)
Albumin: 3.4 g/dL — ABNORMAL LOW (ref 3.5–5.2)
Alkaline Phosphatase: 113 U/L (ref 39–117)
BUN: 13 mg/dL (ref 6–23)
CO2: 29 mEq/L (ref 19–32)
Calcium: 8.7 mg/dL (ref 8.4–10.5)
Chloride: 107 mEq/L (ref 96–112)
Creatinine, Ser: 0.89 mg/dL (ref 0.40–1.20)
GFR: 64.08 mL/min (ref >60.00)
Glucose, Bld: 93 mg/dL (ref 70–99)
Potassium: 3.7 mEq/L (ref 3.5–5.1)
Sodium: 143 mEq/L (ref 135–145)
Total Bilirubin: 0.6 mg/dL (ref 0.2–1.2)
Total Protein: 6.6 g/dL (ref 6.0–8.3)

## 2021-06-13 LAB — CEA: CEA: <2 ng/mL

## 2021-06-14 ENCOUNTER — Ambulatory Visit (INDEPENDENT_AMBULATORY_CARE_PROVIDER_SITE_OTHER): Payer: Medicare PPO

## 2021-06-14 ENCOUNTER — Other Ambulatory Visit: Payer: Self-pay

## 2021-06-14 DIAGNOSIS — I480 Paroxysmal atrial fibrillation: Secondary | ICD-10-CM

## 2021-06-14 DIAGNOSIS — Z5181 Encounter for therapeutic drug level monitoring: Secondary | ICD-10-CM

## 2021-06-14 LAB — POCT INR: INR: 1.5 — AB (ref 2.0–3.0)

## 2021-06-14 NOTE — Patient Instructions (Signed)
TAKE 2 TABLETS TODAY AND Friday and then continue 1 tablet Daily, except 1.5 tablets on Wednesday.  Repeat 3 weeks [phone];  ? ?

## 2021-06-15 ENCOUNTER — Encounter (HOSPITAL_BASED_OUTPATIENT_CLINIC_OR_DEPARTMENT_OTHER): Payer: Self-pay

## 2021-06-15 ENCOUNTER — Ambulatory Visit (HOSPITAL_BASED_OUTPATIENT_CLINIC_OR_DEPARTMENT_OTHER)
Admission: RE | Admit: 2021-06-15 | Discharge: 2021-06-15 | Disposition: A | Discharge status: Home / Self Care | Payer: Medicare PPO | Source: Ambulatory Visit | Attending: Gastroenterology | Admitting: Gastroenterology

## 2021-06-15 DIAGNOSIS — I700 Atherosclerosis of aorta: Secondary | ICD-10-CM | POA: Insufficient documentation

## 2021-06-15 DIAGNOSIS — I517 Cardiomegaly: Secondary | ICD-10-CM | POA: Insufficient documentation

## 2021-06-15 DIAGNOSIS — R195 Other fecal abnormalities: Secondary | ICD-10-CM | POA: Insufficient documentation

## 2021-06-15 DIAGNOSIS — K5730 Diverticulosis of large intestine without perforation or abscess without bleeding: Secondary | ICD-10-CM | POA: Diagnosis not present

## 2021-06-15 DIAGNOSIS — N281 Cyst of kidney, acquired: Secondary | ICD-10-CM | POA: Diagnosis not present

## 2021-06-15 DIAGNOSIS — I2510 Atherosclerotic heart disease of native coronary artery without angina pectoris: Secondary | ICD-10-CM | POA: Diagnosis not present

## 2021-06-15 DIAGNOSIS — K6389 Other specified diseases of intestine: Secondary | ICD-10-CM | POA: Insufficient documentation

## 2021-06-15 MED ORDER — IOHEXOL 300 MG/ML  SOLN
100.0000 mL | Freq: Once | INTRAMUSCULAR | Status: AC | PRN
  Administered 2021-06-15: 100 mL via INTRAVENOUS

## 2021-06-18 ENCOUNTER — Telehealth: Payer: Self-pay

## 2021-06-18 ENCOUNTER — Ambulatory Visit
Admission: RE | Admit: 2021-06-18 | Discharge: 2021-06-18 | Disposition: A | Discharge status: Home / Self Care | Payer: Medicare PPO | Source: Ambulatory Visit | Attending: Family Medicine | Admitting: Family Medicine

## 2021-06-18 DIAGNOSIS — Z1231 Encounter for screening mammogram for malignant neoplasm of breast: Secondary | ICD-10-CM

## 2021-06-18 NOTE — Telephone Encounter (Signed)
Dr. Carlean Purl as DOD PM of 06/18/21 - ?Dr. Loletha Carrow pt that was recently diagnosed with colon mass and + FIT test. Staging CT was completed on 06/15/21. Dr. Loletha Carrow is out of the office this week. Will you please review when you are able? Thanks ?

## 2021-06-18 NOTE — Telephone Encounter (Signed)
The CT scans do not show any evidence of spread of the cancer.  This is good news. ?

## 2021-06-18 NOTE — Telephone Encounter (Signed)
Pt returned call. We have reviewed her CT results. She has been advised to keep her surgical appt as scheduled with Dr. Marcello Moores tomorrow. Pt verbalized understanding and had no concerns at the end of the call. ?

## 2021-06-18 NOTE — Telephone Encounter (Signed)
Lm on vm for patient to return call 

## 2021-06-19 ENCOUNTER — Ambulatory Visit: Payer: Self-pay | Admitting: General Surgery

## 2021-06-19 ENCOUNTER — Telehealth: Payer: Self-pay

## 2021-06-19 DIAGNOSIS — C182 Malignant neoplasm of ascending colon: Secondary | ICD-10-CM | POA: Diagnosis not present

## 2021-06-19 NOTE — Telephone Encounter (Signed)
Clinical pharmacist to review Coumadin ?

## 2021-06-19 NOTE — Telephone Encounter (Signed)
? ?  Pre-operative Risk Assessment  ?  ?Patient Name: [name]  ?DOB: May 30, 1945 ?MRN: [id number]  ? ?  ? ?Request for Surgical Clearance   ? ?Procedure:   Colon surgery ? ?Date of Surgery:  Clearance TBD                              ?   ?Surgeon:  Leighton Ruff, MD ?Surgeon's Group or Practice Name:  [facility] ?Phone number:  [phone] ?Fax number:  [phone] ?  ?Type of Clearance Requested:   ?- Pharmacy:  Hold Warfarin (Coumadin) . How should the patient hold the medication preoperatively? ?  ?Type of Anesthesia:  General  ?  ?Additional requests/questions:  Please advise surgeon/provider what medications should be held. ?Please call to advise if this patient will require an office visit or further medical work-up before clearance can be given. ? ?Signed, ?Kathreen Devoid   ?06/19/2021, 4:31 PM  ? ?

## 2021-06-19 NOTE — H&P (Signed)
? ? ?REFERRING PHYSICIAN:  Danis, Clide Dales III,* ? ?PROVIDER:  Monico Blitz, MD ? ?MRN: [id number] ?DOB: [birth year] ?DATE OF ENCOUNTER: 06/19/2021 ? ?Subjective  ? ?Chief Complaint: Colon Cancer ?  ? ? ?History of Present Illness: ?[name] is a 76 y.o. female who is seen today as an office consultation at the request of Dr. Loletha Carrow for evaluation of Colon Cancer ?.   ?76 year old female who underwent colonoscopy recently due to a positive fit test.  She was noted to have a polyp at the ileocecal valve which was completely resected and a mid ascending colon polyp which was biopsied due to concern for cancer.  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  This mass was tattooed distally.  CEA was normal.  CT scan showed no sign of metastatic disease.  Patient is status post laparoscopic cholecystectomy approximately 30 years ago.  Of note she recently had trouble with what appears to be retained stones and needed a ERCP with sphincterotomy.   ? ? ?Review of Systems: ?A complete review of systems was obtained from the patient.  I have reviewed this information and discussed as appropriate with the patient.  See HPI as well for other ROS. ? ? ? ?Medical History: ?Past Medical History:  ?Diagnosis Date  ? Asthma, unspecified asthma severity, unspecified whether complicated, unspecified whether persistent   ? Difficult intubation   ? GERD (gastroesophageal reflux disease)   ? Hypertension   ? PAF (paroxysmal atrial fibrillation) (CMS-HCC)   ? ? ?There is no problem list on file for this patient. ? ? ?Past Surgical History:  ?Procedure Laterality Date  ? bilateral knee surgery Bilateral   ? CESAREAN SECTION N/A   ? gallbladder surgery N/A   ? HYSTERECTOMY    ? tumor removed from left leg removed Left   ?  ? ?Allergies  ?Allergen Reactions  ? Cocoa Itching  ?  Itching mouth  ? Allergenic Extracts Itching  ? Erythromycin Other (See Comments)  ?  Other reaction(s):  Unknown  ? Other Itching  ?  white potatoes, corn  ? Shellfish Containing Products Other (See Comments)  ? Soybean Itching  ? Strawberry Extract Itching  ? Naproxen Sodium Other (See Comments)  ?  Hurt all over  ? Penicillins Rash  ?  Has patient had a PCN reaction causing immediate rash, facial/tongue/throat swelling, SOB or lightheadedness with hypotension: Yes ?Has patient had a PCN reaction causing severe rash involving mucus membranes or skin necrosis: Yes ?Has patient had a PCN reaction that required hospitalization: No ?Has patient had a PCN reaction occurring within the last 10 years: No ?If all of the above answers are "NO", then may proceed with Cephalosporin use.  ? Promethazine Other (See Comments)  ?  Sick  ?Pt and family states her nausea worsens with phenergan rather than improving to the point she states she cant have it.  ?  ? ? ?Current Outpatient Medications on File Prior to Visit  ?Medication Sig Dispense Refill  ? flecainide (TAMBOCOR) 100 MG tablet Take 100 mg by mouth 2 (two) times daily    ? omeprazole (PRILOSEC) 10 MG DR capsule Take by mouth    ? ursodioL (ACTIGALL) 300 mg capsule Take 600 mg by mouth once daily    ? warfarin (COUMADIN) 5 MG tablet TAKE 1 TO 2 TABLETS BY MOUTH ONCE DAILY OR AS DIRECTED BY COUMADIN CLINIC    ? ?No current facility-administered medications on  file prior to visit.  ? ? ?Family History  ?Problem Relation Age of Onset  ? Breast cancer Mother   ? Breast cancer Sister   ? Diabetes Brother   ?  ? ?Social History  ? ?Tobacco Use  ?Smoking Status Former  ? Types: Cigarettes  ?Smokeless Tobacco Never  ?  ? ?Social History  ? ?Socioeconomic History  ? Marital status: Married  ?Tobacco Use  ? Smoking status: Former  ?  Types: Cigarettes  ? Smokeless tobacco: Never  ?Vaping Use  ? Vaping Use: Never used  ?Substance and Sexual Activity  ? Alcohol use: Never  ? Drug use: Never  ? ? ?Objective:  ? ? ?Vitals:  ? 06/19/21 0949  ?BP: 110/70  ?Pulse: 88  ?Temp: 37 ?C (98.6 ?F)   ?SpO2: 97%  ?Weight: 94.5 kg (208 lb 6.4 oz)  ?Height: 149.9 cm ('4\' 11"'$ )  ?  ? ?Exam ?Gen: NAD ?CV: RRR ?Lungs: CTA ?Abd: soft ? ? ? ?Labs, Imaging and Diagnostic Testing: ?CEA <2 ? ?CT scans reviewed.  There is not appear to be a lot of adhesive disease in the right upper quadrant. ? ? ?Assessment and Plan:  ?Diagnoses and all orders for this visit: ? ?Colon cancer, ascending (CMS-HCC) ? ?  ?76 year old female with paroxysmal A-fib who presents to the office today with a newly diagnosed ascending colon cancer.  This was seen on colonoscopy and tattooed.  CT scan showed no sign of metastatic disease.  I have recommended proceeding with a robotic assisted right colectomy.  We discussed possible increased risk of bleeding due to previous cholecystectomy and need for dissection around the liver bed.  CT scan does not appear to show a lot of adhesive disease in the right upper quadrant.  We will get cardiac clearance prior to proceeding and get a plan for transitioning her off of her Coumadin prior to surgery. ? ?The surgery and anatomy were described to the patient as well as the risks of surgery and the possible complications.  These include: Bleeding, deep abdominal infections and possible wound complications such as hernia and infection, damage to adjacent structures, leak of surgical connections, which can lead to other surgeries and possibly an ostomy, possible need for other procedures, such as abscess drains in radiology, possible prolonged hospital stay, possible diarrhea from removal of part of the colon, possible constipation from narcotics, possible bowel, bladder or sexual dysfunction if having rectal surgery, prolonged fatigue/weakness or appetite loss, possible early recurrence of of disease, possible complications of their medical problems such as heart disease or arrhythmias or lung problems, death (less than 1%). I believe the patient understands and wishes to proceed with the surgery. ?

## 2021-06-19 NOTE — Telephone Encounter (Signed)
Please clarify the nature of the colon surgery. Are we talking about partial or complete colectomy?  ?

## 2021-06-20 NOTE — Telephone Encounter (Signed)
Patient with diagnosis of afib on warfarin for anticoagulation.   ? ?Procedure: colon surgery  ?Date of procedure: TBD ? ?CHA2DS2-VASc Score = 3  ? This indicates a 3.2% annual risk of stroke. ?The patient's score is based upon: ?CHF History: 0 ?HTN History: 1 ?Diabetes History: 0 ?Stroke History: 0 ?Vascular Disease History: 0 ?Age Score: 1 ?Gender Score: 1 ?  ?  ? ?Per office protocol, patient can hold warfarin for 5 days prior to procedure.   ? ?Patient will NOT need bridging with Lovenox (enoxaparin) around procedure. ? ? ?

## 2021-06-20 NOTE — Telephone Encounter (Signed)
? ?  Name: [name]  ?DOB: 1945-08-23  ?MRN: [id number]  ? ?Primary Cardiologist: Minus Breeding, MD ? ?Chart reviewed as part of pre-operative protocol coverage. Patient was contacted 06/20/2021 in reference to pre-operative risk assessment for pending surgery as outlined below.  [name] was last seen on 05/14/2021 by Dr. Percival Spanish.  Since that day, [name] has done well without exertional chest pain or worsening dyspnea.  Recent Myoview was normal. ? ?Therefore, based on ACC/AHA guidelines, the patient would be at acceptable risk for the planned procedure without further cardiovascular testing.  ? ?Per clinical pharmacist, patient may hold Coumadin for 5 days prior to the procedure and restart as soon as possible afterward at the surgeon's discretion. ? ?The patient was advised that if she develops new symptoms prior to surgery to contact our office to arrange for a follow-up visit, and she verbalized understanding. ? ?I will route this recommendation to the requesting party via Epic fax function and remove from pre-op pool. Please call with questions. ? ?Almyra Deforest, [place] ?06/20/2021, 10:37 AM  ?

## 2021-06-20 NOTE — Telephone Encounter (Signed)
Per Mammie Lorenzo, LPN @ [facility], pt is having laparoscopic assist partial colectomy ?

## 2021-07-04 ENCOUNTER — Other Ambulatory Visit (HOSPITAL_COMMUNITY): Payer: Self-pay

## 2021-07-05 ENCOUNTER — Ambulatory Visit (INDEPENDENT_AMBULATORY_CARE_PROVIDER_SITE_OTHER): Payer: Medicare PPO

## 2021-07-05 DIAGNOSIS — Z5181 Encounter for therapeutic drug level monitoring: Secondary | ICD-10-CM

## 2021-07-05 DIAGNOSIS — I480 Paroxysmal atrial fibrillation: Secondary | ICD-10-CM | POA: Diagnosis not present

## 2021-07-05 LAB — POCT INR: INR: 1.8 — AB (ref 2.0–3.0)

## 2021-07-05 NOTE — Patient Instructions (Signed)
TAKE 2 TABLETS TODAY  1 tablet Daily, except 1.5 tablets on Wednesday.  Repeat 3 weeks [phone]; Holding Coumadin 4/9-4/13 ?

## 2021-07-06 NOTE — Patient Instructions (Addendum)
DUE TO COVID-19 ONLY ONE VISITOR  (aged 74 and older)  IS ALLOWED TO COME WITH YOU AND STAY IN THE WAITING ROOM ONLY DURING PRE OP AND PROCEDURE.   ?**NO VISITORS ARE ALLOWED IN THE SHORT STAY AREA OR RECOVERY ROOM!!** ? ?IF YOU WILL BE ADMITTED INTO THE HOSPITAL YOU ARE ALLOWED ONLY TWO SUPPORT PEOPLE DURING VISITATION HOURS ONLY (7 AM -8PM)   ?The support person(s) must pass our screening, gel in and out, and wear a mask at all times, including in the patient?s room. ?Patients must also wear a mask when staff or their support person are in the room. ?Visitors GUEST BADGE MUST BE WORN VISIBLY  ?One adult visitor may remain with you overnight and MUST be in the room by 8 P.M. ?  ? ? Your procedure is scheduled on: 07/20/21 ? ? Report to [facility] Main Entrance ? ?  Report to admitting at : 11:45 AM ? ? Call this number if you have problems the morning of surgery [phone] ? ? CLEAR LIQUID DIET STARTING THE DAY BEFORE SURGERY until : 11:00 AM DAY OF SURGERY ?Water ?Black Coffee (sugar ok, NO MILK/CREAM OR CREAMERS)  ?Tea (sugar ok, NO MILK/CREAM OR CREAMERS) regular and decaf                             ?Plain Jell-O (NO RED)                                           ?Fruit ices (not with fruit pulp, NO RED)                                     ?Popsicles (NO RED)                                                                  ?Juice: apple, WHITE grape, WHITE cranberry ?Sports drinks like Gatorade (NO RED) ?Clear broth(vegetable,chicken,beef) ?  ?DRINK 2 PRESURGERY ENSURE DRINKS THE NIGHT BEFORE SURGERY AT ? 1000 PM AND 1 PRESURGERY DRINK THE DAY OF THE PROCEDURE 3 HOURS PRIOR TO SCHEDULED SURGERY. NO SOLIDS AFTER MIDNIGHT THE DAY PRIOR TO THE SURGERY. NOTHING BY MOUTH EXCEPT CLEAR LIQUIDS UNTIL THREE HOURS PRIOR TO SCHEDULED SURGERY. PLEASE FINISH PRESURGERY ENSURE DRINK PER SURGEON ORDER 3 HOURS PRIOR TO SCHEDULED SURGERY TIME WHICH NEEDS TO BE COMPLETED AT 11:00 AM.  ? ?FOLLOW BOWEL PREP AND ANY  ADDITIONAL PRE OP INSTRUCTIONS YOU RECEIVED FROM YOUR SURGEON'S OFFICE!!! ?  ?Confirm patient has these written instructions and products for their bowel prep the day prior to surgery.  Mix with 64 oz Gatorade/Powerade.  Drink gradually over the next few hours (8 oz glass every 15-30 minutes) until gone the day prior to surgery.  Call pharmacy to adjust medication schedule if needed. ?  ?Oral Hygiene is also important to reduce your risk of infection.                                    ?  Remember - BRUSH YOUR TEETH THE MORNING OF SURGERY WITH YOUR REGULAR TOOTHPASTE ? ? Do NOT smoke after Midnight ? ? Take these medicines the morning of surgery with A SIP OF WATER: flecainide,ursodiol,omeprazole. ? ?DO NOT TAKE ANY ORAL DIABETIC MEDICATIONS DAY OF YOUR SURGERY ? ?Bring CPAP mask and tubing day of surgery. ?                  ?           You may not have any metal on your body including hair pins, jewelry, and body piercing ? ?           Do not wear make-up, lotions, powders, perfumes/cologne, or deodorant ? ?Do not wear nail polish including gel and S&S, artificial/acrylic nails, or any other type of covering on natural nails including finger and toenails. If you have artificial nails, gel coating, etc. that needs to be removed by a nail salon please have this removed prior to surgery or surgery may need to be canceled/ delayed if the surgeon/ anesthesia feels like they are unable to be safely monitored.  ? ?Do not shave  48 hours prior to surgery.  ? ? Do not bring valuables to the hospital. [facility] NOT ?            RESPONSIBLE   FOR VALUABLES. ? ? Contacts, dentures or bridgework may not be worn into surgery. ? ? Bring small overnight bag day of surgery. ?  ? Patients discharged on the day of surgery will not be allowed to drive home.  Someone NEEDS to stay with you for the first 24 hours after anesthesia. ? ? Special Instructions: Bring a copy of your healthcare power of attorney and living will documents          the day of surgery if you haven't scanned them before. ? ?            Please read over the following fact sheets you were given: IF [place] [phone] ? ?   [place] - Preparing for Surgery ?Before surgery, you can play an important role.  Because skin is not sterile, your skin needs to be as free of germs as possible.  You can reduce the number of germs on your skin by washing with CHG (chlorahexidine gluconate) soap before surgery.  CHG is an antiseptic cleaner which kills germs and bonds with the skin to continue killing germs even after washing. ?Please DO NOT use if you have an allergy to CHG or antibacterial soaps.  If your skin becomes reddened/irritated stop using the CHG and inform your nurse when you arrive at Short Stay. ?Do not shave (including legs and underarms) for at least 48 hours prior to the first CHG shower.  You may shave your face/neck. ?Please follow these instructions carefully: ? 1.  Shower with CHG Soap the night before surgery and the  morning of Surgery. ? 2.  If you choose to wash your hair, wash your hair first as usual with your  normal  shampoo. ? 3.  After you shampoo, rinse your hair and body thoroughly to remove the  shampoo.                           4.  Use CHG as you would any other liquid soap.  You can apply chg directly  to the skin and wash  ?  Gently with a scrungie or clean washcloth. ? 5.  Apply the CHG Soap to your body ONLY FROM THE NECK DOWN.   Do not use on face/ open      ?                     Wound or open sores. Avoid contact with eyes, ears mouth and genitals (private parts).  ?                     Production manager,  Genitals (private parts) with your normal soap. ?            6.  Wash thoroughly, paying special attention to the area where your surgery  will be performed. ? 7.  Thoroughly rinse your body with warm water from the neck down. ? 8.  DO NOT shower/wash with your normal soap  after using and rinsing off  the CHG Soap. ?               9.  Pat yourself dry with a clean towel. ?           10.  Wear clean pajamas. ?           11.  Place clean sheets on your bed the night of your first shower and do not  sleep with pets. ?Day of Surgery : ?Do not apply any lotions/deodorants the morning of surgery.  Please wear clean clothes to the hospital/surgery center. ? ?FAILURE TO FOLLOW THESE INSTRUCTIONS MAY RESULT IN THE CANCELLATION OF YOUR SURGERY ?PATIENT SIGNATURE_________________________________ ? ?NURSE SIGNATURE__________________________________ ? ?________________________________________________________________________  ? ?Incentive Spirometer ? ?An incentive spirometer is a tool that can help keep your lungs clear and active. This tool measures how well you are filling your lungs with each breath. Taking long deep breaths may help reverse or decrease the chance of developing breathing (pulmonary) problems (especially infection) following: ?A long period of time when you are unable to move or be active. ?BEFORE THE PROCEDURE  ?If the spirometer includes an indicator to show your best effort, your nurse or respiratory therapist will set it to a desired goal. ?If possible, sit up straight or lean slightly forward. Try not to slouch. ?Hold the incentive spirometer in an upright position. ?INSTRUCTIONS FOR USE  ?Sit on the edge of your bed if possible, or sit up as far as you can in bed or on a chair. ?Hold the incentive spirometer in an upright position. ?Breathe out normally. ?Place the mouthpiece in your mouth and seal your lips tightly around it. ?Breathe in slowly and as deeply as possible, raising the piston or the ball toward the top of the column. ?Hold your breath for 3-5 seconds or for as long as possible. Allow the piston or ball to fall to the bottom of the column. ?Remove the mouthpiece from your mouth and breathe out normally. ?Rest for a few seconds and repeat Steps 1 through 7 at  least 10 times every 1-2 hours when you are awake. Take your time and take a few normal breaths between deep breaths. ?The spirometer may include an indicator to show your best effort. Use the indicator as

## 2021-07-09 ENCOUNTER — Encounter (HOSPITAL_COMMUNITY)
Admission: RE | Admit: 2021-07-09 | Discharge: 2021-07-09 | Disposition: A | Discharge status: Home / Self Care | Payer: Medicare PPO | Source: Ambulatory Visit | Attending: General Surgery | Admitting: General Surgery

## 2021-07-09 ENCOUNTER — Encounter (HOSPITAL_COMMUNITY): Payer: Self-pay

## 2021-07-09 ENCOUNTER — Other Ambulatory Visit: Payer: Self-pay

## 2021-07-09 VITALS — BP 188/63 | HR 66 | Temp 98.8°F | Resp 16 | Ht 59.0 in | Wt 205.0 lb

## 2021-07-09 DIAGNOSIS — I10 Essential (primary) hypertension: Secondary | ICD-10-CM | POA: Diagnosis not present

## 2021-07-09 DIAGNOSIS — Z01812 Encounter for preprocedural laboratory examination: Secondary | ICD-10-CM | POA: Insufficient documentation

## 2021-07-09 DIAGNOSIS — I480 Paroxysmal atrial fibrillation: Secondary | ICD-10-CM | POA: Diagnosis not present

## 2021-07-09 DIAGNOSIS — Z7901 Long term (current) use of anticoagulants: Secondary | ICD-10-CM | POA: Diagnosis not present

## 2021-07-09 DIAGNOSIS — C189 Malignant neoplasm of colon, unspecified: Secondary | ICD-10-CM | POA: Diagnosis not present

## 2021-07-09 DIAGNOSIS — E039 Hypothyroidism, unspecified: Secondary | ICD-10-CM | POA: Diagnosis not present

## 2021-07-09 DIAGNOSIS — Z87891 Personal history of nicotine dependence: Secondary | ICD-10-CM | POA: Diagnosis not present

## 2021-07-09 DIAGNOSIS — Z01818 Encounter for other preprocedural examination: Secondary | ICD-10-CM

## 2021-07-09 HISTORY — DX: Anemia, unspecified: D64.9

## 2021-07-09 HISTORY — DX: Unspecified asthma, uncomplicated: J45.909

## 2021-07-09 HISTORY — DX: Other specified postprocedural states: Z98.890

## 2021-07-09 HISTORY — DX: Malignant (primary) neoplasm, unspecified: C80.1

## 2021-07-09 HISTORY — DX: Cardiac arrhythmia, unspecified: I49.9

## 2021-07-09 LAB — BASIC METABOLIC PANEL
Anion gap: 7 (ref 5–15)
BUN: 20 mg/dL (ref 8–23)
CO2: 28 mmol/L (ref 22–32)
Calcium: 9.2 mg/dL (ref 8.9–10.3)
Chloride: 106 mmol/L (ref 98–111)
Creatinine, Ser: 1.01 mg/dL — ABNORMAL HIGH (ref 0.44–1.00)
GFR, Estimated: 58 mL/min — ABNORMAL LOW (ref >60)
Glucose, Bld: 100 mg/dL — ABNORMAL HIGH (ref 70–99)
Potassium: 4 mmol/L (ref 3.5–5.1)
Sodium: 141 mmol/L (ref 135–145)

## 2021-07-09 LAB — TYPE AND SCREEN
ABO/RH(D): O NEG
Antibody Screen: NEGATIVE

## 2021-07-09 LAB — CBC
HCT: 42.1 % (ref 36.0–46.0)
Hemoglobin: 13.5 g/dL (ref 12.0–15.0)
MCH: 30.1 pg (ref 26.0–34.0)
MCHC: 32.1 g/dL (ref 30.0–36.0)
MCV: 94 fL (ref 80.0–100.0)
Platelets: 242 10*3/uL (ref 150–400)
RBC: 4.48 MIL/uL (ref 3.87–5.11)
RDW: 13.6 % (ref 11.5–15.5)
WBC: 9.7 10*3/uL (ref 4.0–10.5)
nRBC: 0 % (ref 0.0–0.2)

## 2021-07-09 NOTE — Progress Notes (Addendum)
For Short Stay: ?[place] appointment date: N/A ?Date of COVID positive in last 90 days: N/A ?COVID Vaccine: NO ?Bowel Prep reminder: Reviewed  ? ? ?For Anesthesia: ?PCP - Dr. Kelton Pillar ?Cardiologist - Dr. Minus Breeding. LOV: 05/14/21. Clearance: Almyra Deforest: 06/18/19: EPIC ? ?Chest x-ray - CT Chest: 06/16/21 ?EKG - 03/14/21 ?Stress Test - 06/18/19 ?ECHO - 04/18/16 ?Cardiac Cath -  ?Pacemaker/ICD device last checked: ?Pacemaker orders received: ?Device Rep notified: ? ?Spinal Cord Stimulator: ? ?Sleep Study -  ?CPAP -  ? ?Fasting Blood Sugar -  ?Checks Blood Sugar _____ times a day ?Date and result of last Hgb A1c- ? ?Blood Thinner Instructions: Coumadin will be held 5 days before surgery,as per cardiologist instructions,pr. Is aware. ?Aspirin Instructions: ?Last Dose: ? ?Activity level: Can go up a flight of stairs and activities of daily living without stopping and without chest pain and/or shortness of breath ?  Able to exercise without chest pain and/or shortness of breath ?  Unable to go up a flight of stairs without chest pain and/or shortness of breath ?   ? ?Anesthesia review: Hx: HTN,Afib ? ?Patient denies shortness of breath, fever, cough and chest pain at PAT appointment ? ? ?Patient verbalized understanding of instructions that were given to them at the PAT appointment. Patient was also instructed that they will need to review over the PAT instructions again at home before surgery.  ?

## 2021-07-10 NOTE — Progress Notes (Signed)
Anesthesia Chart Review ? ? Case: [id number] Date/Time: 07/20/21 1345  ? Procedure: XI ROBOT ASSISTED LAPAROSCOPIC PARTIAL COLECTOMY  ? Anesthesia type: General  ? Pre-op diagnosis: COLON CANCER  ? Location: [facility] ROOM 02 / [facility]  ? Surgeons: Leighton Ruff, MD  ? ?  ? ? ?DISCUSSION:76 y.o. former smoker with h/o PONV, PAF (on Coumadin), HTN, hypothyroidism, colon cancer scheduled for above procedure 4/43/1540 with Dr. Leighton Ruff.  ? ?Per cardiology preoperative evaluation 06/20/2021, "Chart reviewed as part of pre-operative protocol coverage. Patient was contacted 06/20/2021 in reference to pre-operative risk assessment for pending surgery as outlined below.  MCKENLEE MANGHAM was last seen on 05/14/2021 by Dr. Percival Spanish.  Since that day, YARIMA PENMAN has done well without exertional chest pain or worsening dyspnea.  Recent Myoview was normal. ?  ?Therefore, based on ACC/AHA guidelines, the patient would be at acceptable risk for the planned procedure without further cardiovascular testing.  ?  ?Per clinical pharmacist, patient may hold Coumadin for 5 days prior to the procedure and restart as soon as possible afterward at the surgeon's discretion." ? ?No anesthesia complications noted with 03/15/21 and 07/14/20 anesthesia notes.  ? ?07/14/20 anesthesia note:  ?"Induction Type: IV induction ?Ventilation: Mask ventilation without difficulty and Oral airway inserted - appropriate to patient size ?Laryngoscope Size: Mac and 3 ?Grade View: Grade I ?Tube type: Oral ?Number of attempts: 1" ? ?03/15/21 anesthesia note:   ?"Induction Type: IV induction ?Ventilation: Mask ventilation without difficulty ?Laryngoscope Size: Sabra Heck and 2 ?Grade View: Grade I ?Tube type: Oral ?Tube size: 7.0 mm ?Number of attempts: 1 ? ?Anticipate pt can proceed with planned procedure barring acute status change.   ?VS: BP (!) 188/63   Pulse 66   Temp 37.1 ?C (Oral)   Resp 16   Ht _0  (1.499 m)   Wt 93 kg   SpO2 100%   BMI 41.40 kg/m?   ? ?PROVIDERS: ?Kelton Pillar, MD is PCP  ? ?Primary Cardiologist: Minus Breeding, MD ?LABS: Labs reviewed: Acceptable for surgery. ?(all labs ordered are listed, but only abnormal results are displayed) ? ?Labs Reviewed  ?BASIC METABOLIC PANEL - Abnormal; Notable for the following components:  ?    Result Value  ? Glucose, Bld 100 (*)   ? Creatinine, Ser 1.01 (*)   ? GFR, Estimated 58 (*)   ? All other components within normal limits  ?CBC  ?TYPE AND SCREEN  ? ? ? ?IMAGES: ? ? ?EKG: ?03/14/2021 ?Rate 63 bpm  ?Sinus rhythm  ?Left ventricular hypertrophy ?Borderline prolonged QT interval  ? ?CV: ?Stress Test 05/24/2021 ?  The study is normal. The study is low risk. ?  LV perfusion is normal. There is no evidence of ischemia. ?  Left ventricular function is normal. Nuclear stress EF: 63 %. The left ventricular ejection fraction is normal (55-65%). End diastolic cavity size is normal. ?  Prior study available for comparison from 05/22/2016. - no changes from previous ? ?Echo 04/18/2016 ?Study Conclusions  ? ?- Left ventricle: The cavity size was normal. Wall thickness was  ?  normal. Systolic function was normal. The estimated ejection  ?  fraction was in the range of 60% to 65%. Wall motion was normal;  ?  there were no regional wall motion abnormalities. Features are  ?  consistent with a pseudonormal left ventricular filling pattern,  ?  with concomitant abnormal relaxation and increased filling  ?  pressure (grade 2 diastolic dysfunction).  ?Past Medical History:  ?  Diagnosis Date  ? Anemia   ? Asthma   ? Borderline hyperlipidemia   ? Borderline hypertension   ? Cancer [facility])   ? Complication of anesthesia   ? PONV  ? Difficult intubation   ? Dysrhythmia   ? Hypertension   ? Hypothyroid   ? s/p PTU therapy  ? Hypothyroidism 09/15/2008  ? Qualifier: Diagnosis of  By: Percival Spanish, MD, Farrel Gordon    ? PAF (paroxysmal atrial fibrillation) ([place])   ? PONV (postoperative nausea and vomiting)   ? ? ?Past Surgical History:   ?Procedure Laterality Date  ? BILIARY DILATION  07/14/2020  ? Procedure: BILIARY DILATION;  Surgeon: Milus Banister, MD;  Location: [facility] ENDOSCOPY;  Service: Endoscopy;;  ? CESAREAN SECTION    ? CHOLECYSTECTOMY    ? ENDOSCOPIC RETROGRADE CHOLANGIOPANCREATOGRAPHY (ERCP) WITH PROPOFOL N/A 07/14/2020  ? Procedure: ENDOSCOPIC RETROGRADE CHOLANGIOPANCREATOGRAPHY (ERCP) WITH PROPOFOL;  Surgeon: Milus Banister, MD;  Location: [facility] ENDOSCOPY;  Service: Endoscopy;  Laterality: N/A;  ? ENDOSCOPIC RETROGRADE CHOLANGIOPANCREATOGRAPHY (ERCP) WITH PROPOFOL N/A 03/15/2021  ? Procedure: ENDOSCOPIC RETROGRADE CHOLANGIOPANCREATOGRAPHY (ERCP) WITH PROPOFOL;  Surgeon: Gatha Mayer, MD;  Location: [facility] ENDOSCOPY;  Service: Endoscopy;  Laterality: N/A;  ? ESOPHAGOGASTRODUODENOSCOPY (EGD) WITH PROPOFOL N/A 07/14/2020  ? Procedure: ESOPHAGOGASTRODUODENOSCOPY (EGD) WITH PROPOFOL;  Surgeon: Milus Banister, MD;  Location: [facility] ENDOSCOPY;  Service: Endoscopy;  Laterality: N/A;  ? KNEE SURGERY    ? right 2014, left 2015, arthroscipic  ? LEG SURGERY    ? Left leg d/t  tumor (benign)  ? REMOVAL OF STONES  07/14/2020  ? Procedure: REMOVAL OF STONES;  Surgeon: Milus Banister, MD;  Location: [facility] ENDOSCOPY;  Service: Endoscopy;;  ? SPHINCTEROTOMY  07/14/2020  ? Procedure: SPHINCTEROTOMY;  Surgeon: Milus Banister, MD;  Location: [facility];  Service: Endoscopy;;  ? THYROID SURGERY    ? UPPER ESOPHAGEAL ENDOSCOPIC ULTRASOUND (EUS) N/A 07/14/2020  ? Procedure: UPPER ESOPHAGEAL ENDOSCOPIC ULTRASOUND (EUS);  Surgeon: Milus Banister, MD;  Location: [facility] ENDOSCOPY;  Service: Endoscopy;  Laterality: N/A;  ? ? ?MEDICATIONS: ? dicyclomine (BENTYL) 10 MG capsule  ? flecainide (TAMBOCOR) 100 MG tablet  ? omeprazole (PRILOSEC) 10 MG capsule  ? ursodiol (ACTIGALL) 300 MG capsule  ? warfarin (COUMADIN) 5 MG tablet  ? ?No current facility-administered medications for this encounter.  ? ? ?Konrad Felix Ward, PA-C ?[facility] ?[phone] ? ? ? ? ? ? ?

## 2021-07-10 NOTE — Anesthesia Preprocedure Evaluation (Deleted)
Anesthesia Evaluation  ? ? ?Airway ? ? ? ? ? ? ? Dental ?  ?Pulmonary ?former smoker,  ?  ? ? ? ? ? ? ? Cardiovascular ?hypertension,  ? ? ?  ?Neuro/Psych ?  ? GI/Hepatic ?  ?Endo/Other  ? ? Renal/GU ?  ? ?  ?Musculoskeletal ? ? Abdominal ?  ?Peds ? Hematology ?  ?Anesthesia Other Findings ? ? Reproductive/Obstetrics ? ?  ? ? ? ? ? ? ? ? ? ? ? ? ? ?  ?  ? ? ? ? ? ? ? ?                                  Anesthesia Evaluation  ?Patient identified by MRN, date of birth, ID band ?Patient awake ? ? ? ?Reviewed: ?Allergy & Precautions, NPO status , Patient's Chart, lab work & pertinent test results ? ?History of Anesthesia Complications ?(+) history of anesthetic complications ? ?Airway ?Mallampati: II ? ?TM Distance: >3 FB ?Neck ROM: Full ? ? ? Dental ?no notable dental hx. ?(+) Upper Dentures, Edentulous Lower ?  ?Pulmonary ?asthma , former smoker,  ?  ?Pulmonary exam normal ?breath sounds clear to auscultation ? ? ? ? ? ? Cardiovascular ?hypertension, Pt. on medications and Pt. on home beta blockers ?Normal cardiovascular exam+ dysrhythmias Atrial Fibrillation  ?Rhythm:Irregular Rate:Abnormal ? ? ?  ?Neuro/Psych ?negative psych ROS  ? GI/Hepatic ?negative GI ROS, Neg liver ROS,   ?Endo/Other  ?Hypothyroidism  ? Renal/GU ?negative Renal ROS  ? ?  ?Musculoskeletal ?negative musculoskeletal ROS ?(+)  ? Abdominal ?(+) + obese,   ?Peds ? Hematology ?  ?Anesthesia Other Findings ? ? Reproductive/Obstetrics ? ?  ? ? ? ? ? ? ? ? ? ? ? ? ? ?  ?  ? ? ? ? ? ? ?Anesthesia Physical ?Anesthesia Plan ? ?ASA: III ? ?Anesthesia Plan: General  ? ?Post-op Pain Management:   ? ?Induction: Intravenous ? ?PONV Risk Score and Plan: 3 and Treatment may vary due to age or medical condition, Ondansetron, Dexamethasone and Scopolamine patch - Pre-op ? ?Airway Management Planned: Oral ETT ? ?Additional Equipment: None ? ?Intra-op Plan:  ? ?Post-operative Plan: Extubation in OR ? ?Informed Consent: I have reviewed  the patients History and Physical, chart, labs and discussed the procedure including the risks, benefits and alternatives for the proposed anesthesia with the patient or authorized representative who has indicated his/her understanding and acceptance.  ? ? ? ?Dental advisory given ? ?Plan Discussed with: CRNA and Anesthesiologist ? ?Anesthesia Plan Comments:   ? ? ? ? ?Anesthesia Quick Evaluation ? ?Anesthesia Physical ?Anesthesia Plan ? ?ASA:  ? ?Anesthesia Plan:   ? ?Post-op Pain Management:   ? ?Induction:  ? ?PONV Risk Score and Plan:  ? ?Airway Management Planned:  ? ?Additional Equipment:  ? ?Intra-op Plan:  ? ?Post-operative Plan:  ? ?Informed Consent:  ? ?Plan Discussed with:  ? ?Anesthesia Plan Comments: (See PAT note 07/09/2021, Lyndon Code, PA-C)  ? ? ? ? ? ? ?Anesthesia Quick Evaluation ? ?

## 2021-07-20 ENCOUNTER — Other Ambulatory Visit: Payer: Self-pay

## 2021-07-20 ENCOUNTER — Encounter (HOSPITAL_COMMUNITY)
Admission: RE | Disposition: A | Discharge status: Home / Self Care | Payer: Self-pay | Source: Ambulatory Visit | Attending: General Surgery

## 2021-07-20 ENCOUNTER — Encounter (HOSPITAL_COMMUNITY): Payer: Self-pay | Admitting: General Surgery

## 2021-07-20 ENCOUNTER — Inpatient Hospital Stay (HOSPITAL_COMMUNITY): Payer: Medicare PPO | Admitting: Physician Assistant

## 2021-07-20 ENCOUNTER — Inpatient Hospital Stay (HOSPITAL_COMMUNITY)
Admission: RE | Admit: 2021-07-20 | Discharge: 2021-07-24 | DRG: 330 | Disposition: A | Discharge status: Home / Self Care | Payer: Medicare PPO | Source: Ambulatory Visit | Attending: General Surgery | Admitting: General Surgery

## 2021-07-20 ENCOUNTER — Inpatient Hospital Stay (HOSPITAL_COMMUNITY): Payer: Medicare PPO | Admitting: Certified Registered Nurse Anesthetist

## 2021-07-20 DIAGNOSIS — Z6841 Body Mass Index (BMI) 40.0 and over, adult: Secondary | ICD-10-CM | POA: Diagnosis not present

## 2021-07-20 DIAGNOSIS — N179 Acute kidney failure, unspecified: Secondary | ICD-10-CM | POA: Diagnosis not present

## 2021-07-20 DIAGNOSIS — C189 Malignant neoplasm of colon, unspecified: Principal | ICD-10-CM

## 2021-07-20 DIAGNOSIS — E782 Mixed hyperlipidemia: Secondary | ICD-10-CM | POA: Diagnosis present

## 2021-07-20 DIAGNOSIS — Z87891 Personal history of nicotine dependence: Secondary | ICD-10-CM | POA: Diagnosis not present

## 2021-07-20 DIAGNOSIS — I10 Essential (primary) hypertension: Secondary | ICD-10-CM | POA: Diagnosis present

## 2021-07-20 DIAGNOSIS — Z91013 Allergy to seafood: Secondary | ICD-10-CM

## 2021-07-20 DIAGNOSIS — Z01818 Encounter for other preprocedural examination: Secondary | ICD-10-CM

## 2021-07-20 DIAGNOSIS — E039 Hypothyroidism, unspecified: Secondary | ICD-10-CM | POA: Diagnosis present

## 2021-07-20 DIAGNOSIS — Z79899 Other long term (current) drug therapy: Secondary | ICD-10-CM | POA: Diagnosis not present

## 2021-07-20 DIAGNOSIS — Z881 Allergy status to other antibiotic agents status: Secondary | ICD-10-CM | POA: Diagnosis not present

## 2021-07-20 DIAGNOSIS — Z7901 Long term (current) use of anticoagulants: Secondary | ICD-10-CM

## 2021-07-20 DIAGNOSIS — Z91018 Allergy to other foods: Secondary | ICD-10-CM

## 2021-07-20 DIAGNOSIS — I4891 Unspecified atrial fibrillation: Secondary | ICD-10-CM | POA: Diagnosis not present

## 2021-07-20 DIAGNOSIS — K219 Gastro-esophageal reflux disease without esophagitis: Secondary | ICD-10-CM | POA: Diagnosis present

## 2021-07-20 DIAGNOSIS — Z90710 Acquired absence of both cervix and uterus: Secondary | ICD-10-CM | POA: Diagnosis not present

## 2021-07-20 DIAGNOSIS — J309 Allergic rhinitis, unspecified: Secondary | ICD-10-CM | POA: Diagnosis present

## 2021-07-20 DIAGNOSIS — E860 Dehydration: Secondary | ICD-10-CM | POA: Diagnosis not present

## 2021-07-20 DIAGNOSIS — Z9102 Food additives allergy status: Secondary | ICD-10-CM | POA: Diagnosis not present

## 2021-07-20 DIAGNOSIS — Z5181 Encounter for therapeutic drug level monitoring: Secondary | ICD-10-CM | POA: Diagnosis not present

## 2021-07-20 DIAGNOSIS — I480 Paroxysmal atrial fibrillation: Secondary | ICD-10-CM | POA: Diagnosis not present

## 2021-07-20 DIAGNOSIS — Z886 Allergy status to analgesic agent status: Secondary | ICD-10-CM | POA: Diagnosis not present

## 2021-07-20 DIAGNOSIS — Z880 Allergy status to penicillin: Secondary | ICD-10-CM | POA: Diagnosis not present

## 2021-07-20 DIAGNOSIS — C182 Malignant neoplasm of ascending colon: Secondary | ICD-10-CM | POA: Diagnosis not present

## 2021-07-20 LAB — PROTIME-INR
INR: 1.2 (ref 0.8–1.2)
Prothrombin Time: 15 seconds (ref 11.4–15.2)

## 2021-07-20 LAB — APTT: aPTT: 30 seconds (ref 24–36)

## 2021-07-20 SURGERY — COLECTOMY, PARTIAL, ROBOT-ASSISTED, LAPAROSCOPIC
Anesthesia: General | Site: Abdomen

## 2021-07-20 MED ORDER — 0.9 % SODIUM CHLORIDE (POUR BTL) OPTIME
TOPICAL | Status: DC | PRN
  Administered 2021-07-20: 2000 mL

## 2021-07-20 MED ORDER — ROCURONIUM BROMIDE 10 MG/ML (PF) SYRINGE
PREFILLED_SYRINGE | INTRAVENOUS | Status: DC | PRN
  Administered 2021-07-20: 100 mg via INTRAVENOUS

## 2021-07-20 MED ORDER — ENOXAPARIN SODIUM 40 MG/0.4ML IJ SOSY
40.0000 mg | PREFILLED_SYRINGE | INTRAMUSCULAR | Status: DC
  Administered 2021-07-21 – 2021-07-24 (×4): 40 mg via SUBCUTANEOUS
  Filled 2021-07-20 (×4): qty 0.4

## 2021-07-20 MED ORDER — SODIUM CHLORIDE 0.9 % IV SOLN
2.0000 g | Freq: Two times a day (BID) | INTRAVENOUS | Status: AC
  Administered 2021-07-21: 2 g via INTRAVENOUS
  Filled 2021-07-20: qty 2

## 2021-07-20 MED ORDER — BUPIVACAINE LIPOSOME 1.3 % IJ SUSP
INTRAMUSCULAR | Status: DC | PRN
  Administered 2021-07-20: 20 mL

## 2021-07-20 MED ORDER — DEXAMETHASONE SODIUM PHOSPHATE 10 MG/ML IJ SOLN
INTRAMUSCULAR | Status: DC | PRN
Start: 2021-07-20 — End: 2021-07-20
  Administered 2021-07-20: 10 mg via INTRAVENOUS

## 2021-07-20 MED ORDER — ONDANSETRON HCL 4 MG/2ML IJ SOLN
INTRAMUSCULAR | Status: DC | PRN
  Administered 2021-07-20: 4 mg via INTRAVENOUS

## 2021-07-20 MED ORDER — LIDOCAINE HCL (PF) 2 % IJ SOLN
INTRAMUSCULAR | Status: AC
  Filled 2021-07-20: qty 5

## 2021-07-20 MED ORDER — FENTANYL CITRATE (PF) 100 MCG/2ML IJ SOLN
INTRAMUSCULAR | Status: AC
Start: 2021-07-20 — End: ?
  Filled 2021-07-20: qty 2

## 2021-07-20 MED ORDER — ALBUMIN HUMAN 5 % IV SOLN: INTRAVENOUS | Status: DC | PRN

## 2021-07-20 MED ORDER — PHENYLEPHRINE HCL (PRESSORS) 10 MG/ML IV SOLN
INTRAVENOUS | Status: AC
  Filled 2021-07-20: qty 1

## 2021-07-20 MED ORDER — ALVIMOPAN 12 MG PO CAPS
12.0000 mg | ORAL_CAPSULE | Freq: Two times a day (BID) | ORAL | Status: DC
  Administered 2021-07-21 (×2): 12 mg via ORAL
  Filled 2021-07-20 (×2): qty 1

## 2021-07-20 MED ORDER — PHENYLEPHRINE HCL-NACL 20-0.9 MG/250ML-% IV SOLN
INTRAVENOUS | Status: DC | PRN
  Administered 2021-07-20: 50 ug/min via INTRAVENOUS

## 2021-07-20 MED ORDER — BUPIVACAINE LIPOSOME 1.3 % IJ SUSP
INTRAMUSCULAR | Status: AC
  Filled 2021-07-20: qty 20

## 2021-07-20 MED ORDER — POLYETHYLENE GLYCOL 3350 17 GM/SCOOP PO POWD: 1.0000 | Freq: Once | ORAL | Status: DC

## 2021-07-20 MED ORDER — DEXAMETHASONE SODIUM PHOSPHATE 10 MG/ML IJ SOLN
INTRAMUSCULAR | Status: AC
  Filled 2021-07-20: qty 1

## 2021-07-20 MED ORDER — ACETAMINOPHEN 500 MG PO TABS
1000.0000 mg | ORAL_TABLET | ORAL | Status: AC
  Administered 2021-07-20: 1000 mg via ORAL
  Filled 2021-07-20: qty 2

## 2021-07-20 MED ORDER — ROCURONIUM BROMIDE 10 MG/ML (PF) SYRINGE
PREFILLED_SYRINGE | INTRAVENOUS | Status: AC
  Filled 2021-07-20: qty 10

## 2021-07-20 MED ORDER — BUPIVACAINE LIPOSOME 1.3 % IJ SUSP: 20.0000 mL | Freq: Once | INTRAMUSCULAR | Status: DC

## 2021-07-20 MED ORDER — LACTATED RINGERS IV SOLN
INTRAVENOUS | Status: DC
Start: 2021-07-20 — End: 2021-07-20

## 2021-07-20 MED ORDER — PANTOPRAZOLE SODIUM 40 MG PO TBEC
40.0000 mg | DELAYED_RELEASE_TABLET | Freq: Every day | ORAL | Status: DC
Start: 2021-07-20 — End: 2021-07-24
  Administered 2021-07-20 – 2021-07-24 (×5): 40 mg via ORAL
  Filled 2021-07-20 (×5): qty 1

## 2021-07-20 MED ORDER — GABAPENTIN 300 MG PO CAPS
300.0000 mg | ORAL_CAPSULE | ORAL | Status: AC
  Administered 2021-07-20: 300 mg via ORAL
  Filled 2021-07-20: qty 1

## 2021-07-20 MED ORDER — SPY AGENT GREEN - (INDOCYANINE FOR INJECTION)
INTRAMUSCULAR | Status: DC | PRN
Start: 2021-07-20 — End: 2021-07-20
  Administered 2021-07-20: 2.5 mL via INTRAVENOUS

## 2021-07-20 MED ORDER — ALBUMIN HUMAN 5 % IV SOLN
INTRAVENOUS | Status: AC
  Filled 2021-07-20: qty 250

## 2021-07-20 MED ORDER — BISACODYL 5 MG PO TBEC: 20.0000 mg | DELAYED_RELEASE_TABLET | Freq: Once | ORAL | Status: DC

## 2021-07-20 MED ORDER — SACCHAROMYCES BOULARDII 250 MG PO CAPS
250.0000 mg | ORAL_CAPSULE | Freq: Two times a day (BID) | ORAL | Status: DC
  Administered 2021-07-20 – 2021-07-24 (×8): 250 mg via ORAL
  Filled 2021-07-20 (×8): qty 1

## 2021-07-20 MED ORDER — ACETAMINOPHEN 500 MG PO TABS
1000.0000 mg | ORAL_TABLET | Freq: Four times a day (QID) | ORAL | Status: DC
  Administered 2021-07-20 – 2021-07-24 (×13): 1000 mg via ORAL
  Filled 2021-07-20 (×14): qty 2

## 2021-07-20 MED ORDER — LIDOCAINE 2% (20 MG/ML) 5 ML SYRINGE
INTRAMUSCULAR | Status: DC | PRN
  Administered 2021-07-20: 100 mg via INTRAVENOUS

## 2021-07-20 MED ORDER — EPHEDRINE SULFATE-NACL 50-0.9 MG/10ML-% IV SOSY
PREFILLED_SYRINGE | INTRAVENOUS | Status: DC | PRN
  Administered 2021-07-20: 20 mg via INTRAVENOUS
  Administered 2021-07-20: 5 mg via INTRAVENOUS

## 2021-07-20 MED ORDER — ALVIMOPAN 12 MG PO CAPS
12.0000 mg | ORAL_CAPSULE | ORAL | Status: AC
Start: 2021-07-20 — End: 2021-07-20
  Administered 2021-07-20: 12 mg via ORAL
  Filled 2021-07-20: qty 1

## 2021-07-20 MED ORDER — LIDOCAINE HCL 2 % IJ SOLN
INTRAMUSCULAR | Status: AC
Start: 2021-07-20 — End: ?
  Filled 2021-07-20: qty 20

## 2021-07-20 MED ORDER — SODIUM CHLORIDE 0.9 % IV SOLN
2.0000 g | INTRAVENOUS | Status: AC
  Administered 2021-07-20: 2 g via INTRAVENOUS
  Filled 2021-07-20: qty 2

## 2021-07-20 MED ORDER — ONDANSETRON HCL 4 MG/2ML IJ SOLN
INTRAMUSCULAR | Status: AC
  Filled 2021-07-20: qty 2

## 2021-07-20 MED ORDER — FENTANYL CITRATE (PF) 100 MCG/2ML IJ SOLN
INTRAMUSCULAR | Status: DC | PRN
  Administered 2021-07-20 (×3): 50 ug via INTRAVENOUS

## 2021-07-20 MED ORDER — FENTANYL CITRATE PF 50 MCG/ML IJ SOSY: 25.0000 ug | PREFILLED_SYRINGE | INTRAMUSCULAR | Status: DC | PRN

## 2021-07-20 MED ORDER — CHLORHEXIDINE GLUCONATE 0.12 % MT SOLN
15.0000 mL | Freq: Once | OROMUCOSAL | Status: AC
  Administered 2021-07-20: 15 mL via OROMUCOSAL

## 2021-07-20 MED ORDER — BUPIVACAINE-EPINEPHRINE (PF) 0.25% -1:200000 IJ SOLN
INTRAMUSCULAR | Status: AC
  Filled 2021-07-20: qty 30

## 2021-07-20 MED ORDER — BUPIVACAINE-EPINEPHRINE 0.25% -1:200000 IJ SOLN
INTRAMUSCULAR | Status: DC | PRN
  Administered 2021-07-20: 30 mL

## 2021-07-20 MED ORDER — ALUM & MAG HYDROXIDE-SIMETH 200-200-20 MG/5ML PO SUSP: 30.0000 mL | Freq: Four times a day (QID) | ORAL | Status: DC | PRN

## 2021-07-20 MED ORDER — ENSURE PRE-SURGERY PO LIQD: 296.0000 mL | Freq: Once | ORAL | Status: DC

## 2021-07-20 MED ORDER — PROPOFOL 10 MG/ML IV BOLUS
INTRAVENOUS | Status: DC | PRN
  Administered 2021-07-20: 130 mg via INTRAVENOUS
  Administered 2021-07-20: 50 mg via INTRAVENOUS

## 2021-07-20 MED ORDER — OXYCODONE HCL 5 MG PO TABS
5.0000 mg | ORAL_TABLET | ORAL | Status: DC | PRN
  Administered 2021-07-20 – 2021-07-23 (×3): 5 mg via ORAL
  Filled 2021-07-20 (×3): qty 1

## 2021-07-20 MED ORDER — PROPOFOL 10 MG/ML IV BOLUS
INTRAVENOUS | Status: AC
  Filled 2021-07-20: qty 20

## 2021-07-20 MED ORDER — KCL IN DEXTROSE-NACL 20-5-0.45 MEQ/L-%-% IV SOLN
INTRAVENOUS | Status: DC
  Filled 2021-07-20: qty 1000

## 2021-07-20 MED ORDER — SUGAMMADEX SODIUM 200 MG/2ML IV SOLN
INTRAVENOUS | Status: DC | PRN
Start: 2021-07-20 — End: 2021-07-20
  Administered 2021-07-20 (×2): 100 mg via INTRAVENOUS

## 2021-07-20 MED ORDER — ENSURE SURGERY PO LIQD
237.0000 mL | Freq: Two times a day (BID) | ORAL | Status: DC
  Administered 2021-07-21 – 2021-07-23 (×4): 237 mL via ORAL

## 2021-07-20 MED ORDER — ORAL CARE MOUTH RINSE: 15.0000 mL | Freq: Once | OROMUCOSAL | Status: AC

## 2021-07-20 MED ORDER — LIDOCAINE HCL (PF) 2 % IJ SOLN
INTRAMUSCULAR | Status: DC | PRN
  Administered 2021-07-20: 1.5 mg/kg/h via INTRADERMAL

## 2021-07-20 MED ORDER — FLECAINIDE ACETATE 100 MG PO TABS
100.0000 mg | ORAL_TABLET | Freq: Two times a day (BID) | ORAL | Status: DC
  Administered 2021-07-20 – 2021-07-24 (×8): 100 mg via ORAL
  Filled 2021-07-20 (×8): qty 1

## 2021-07-20 MED ORDER — HYDROMORPHONE HCL 1 MG/ML IJ SOLN
0.5000 mg | INTRAMUSCULAR | Status: DC | PRN
  Administered 2021-07-20: 0.5 mg via INTRAVENOUS
  Filled 2021-07-20: qty 0.5

## 2021-07-20 MED ORDER — LACTATED RINGERS IR SOLN
Status: DC | PRN
Start: 2021-07-20 — End: 2021-07-20
  Administered 2021-07-20: 1000 mL

## 2021-07-20 MED ORDER — ENSURE PRE-SURGERY PO LIQD: 592.0000 mL | Freq: Once | ORAL | Status: DC

## 2021-07-20 MED ORDER — GABAPENTIN 300 MG PO CAPS
300.0000 mg | ORAL_CAPSULE | Freq: Two times a day (BID) | ORAL | Status: DC
  Administered 2021-07-20 – 2021-07-24 (×8): 300 mg via ORAL
  Filled 2021-07-20 (×8): qty 1

## 2021-07-20 MED ORDER — SIMETHICONE 80 MG PO CHEW
40.0000 mg | CHEWABLE_TABLET | Freq: Four times a day (QID) | ORAL | Status: DC | PRN
Start: 2021-07-20 — End: 2021-07-24

## 2021-07-20 MED ORDER — AMISULPRIDE (ANTIEMETIC) 5 MG/2ML IV SOLN: 10.0000 mg | Freq: Once | INTRAVENOUS | Status: DC | PRN

## 2021-07-20 SURGICAL SUPPLY — 93 items
BAG COUNTER SPONGE SURGICOUNT (BAG) ×3 IMPLANT
BAG SPNG CNTER NS LX DISP (BAG) ×1
BLADE EXTENDED COATED 6.5IN (ELECTRODE) IMPLANT
CANNULA REDUC XI 12-8 STAPL (CANNULA)
CANNULA REDUCER 12-8 DVNC XI (CANNULA) IMPLANT
CELLS DAT CNTRL 66122 CELL SVR (MISCELLANEOUS) IMPLANT
COVER SURGICAL LIGHT HANDLE (MISCELLANEOUS) ×6 IMPLANT
COVER TIP SHEARS 8 DVNC (MISCELLANEOUS) ×2 IMPLANT
COVER TIP SHEARS 8MM DA VINCI (MISCELLANEOUS) ×2
DRAIN CHANNEL 19F RND (DRAIN) IMPLANT
DRAPE ARM DVNC X/XI (DISPOSABLE) ×8 IMPLANT
DRAPE COLUMN DVNC XI (DISPOSABLE) ×2 IMPLANT
DRAPE DA VINCI XI ARM (DISPOSABLE) ×8
DRAPE DA VINCI XI COLUMN (DISPOSABLE) ×2
DRAPE SURG IRRIG POUCH 19X23 (DRAPES) ×3 IMPLANT
DRSG OPSITE POSTOP 4X10 (GAUZE/BANDAGES/DRESSINGS) IMPLANT
DRSG OPSITE POSTOP 4X6 (GAUZE/BANDAGES/DRESSINGS) ×1 IMPLANT
DRSG OPSITE POSTOP 4X8 (GAUZE/BANDAGES/DRESSINGS) IMPLANT
ELECT PENCIL ROCKER SW 15FT (MISCELLANEOUS) ×3 IMPLANT
ELECT REM PT RETURN 15FT ADLT (MISCELLANEOUS) ×3 IMPLANT
ENDOLOOP SUT PDS II  0 18 (SUTURE)
ENDOLOOP SUT PDS II 0 18 (SUTURE) IMPLANT
EVACUATOR SILICONE 100CC (DRAIN) IMPLANT
GLOVE BIO SURGEON STRL SZ 6.5 (GLOVE) ×9 IMPLANT
GLOVE BIOGEL PI IND STRL 7.0 (GLOVE) ×4 IMPLANT
GLOVE BIOGEL PI INDICATOR 7.0 (GLOVE) ×2
GLOVE INDICATOR 6.5 STRL GRN (GLOVE) ×3 IMPLANT
GOWN STRL REUS W/ TWL XL LVL3 (GOWN DISPOSABLE) ×6 IMPLANT
GOWN STRL REUS W/TWL XL LVL3 (GOWN DISPOSABLE) ×6
GRASPER SUT TROCAR 14GX15 (MISCELLANEOUS) IMPLANT
HOLDER FOLEY CATH W/STRAP (MISCELLANEOUS) ×3 IMPLANT
IRRIG SUCT STRYKERFLOW 2 WTIP (MISCELLANEOUS) ×2
IRRIGATION SUCT STRKRFLW 2 WTP (MISCELLANEOUS) ×2 IMPLANT
KIT PROCEDURE DA VINCI SI (MISCELLANEOUS)
KIT PROCEDURE DVNC SI (MISCELLANEOUS) IMPLANT
KIT TURNOVER KIT A (KITS) IMPLANT
NDL INSUFFLATION 14GA 120MM (NEEDLE) ×2 IMPLANT
NEEDLE INSUFFLATION 14GA 120MM (NEEDLE) ×2 IMPLANT
PACK CARDIOVASCULAR III (CUSTOM PROCEDURE TRAY) ×3 IMPLANT
PACK COLON (CUSTOM PROCEDURE TRAY) ×3 IMPLANT
PAD POSITIONING PINK XL (MISCELLANEOUS) ×3 IMPLANT
RELOAD STAPLE 60 3.5 BLU DVNC (STAPLE) IMPLANT
RELOAD STAPLE 60 4.3 GRN DVNC (STAPLE) IMPLANT
RELOAD STAPLER 3.5X60 BLU DVNC (STAPLE) IMPLANT
RELOAD STAPLER 4.3X60 GRN DVNC (STAPLE) IMPLANT
RETRACTOR WND ALEXIS 18 MED (MISCELLANEOUS) IMPLANT
RTRCTR WOUND ALEXIS 18CM MED (MISCELLANEOUS)
SCISSORS LAP 5X35 DISP (ENDOMECHANICALS) IMPLANT
SEAL CANN UNIV 5-8 DVNC XI (MISCELLANEOUS) ×6 IMPLANT
SEAL XI 5MM-8MM UNIVERSAL (MISCELLANEOUS) ×6
SEALER VESSEL DA VINCI XI (MISCELLANEOUS) ×2
SEALER VESSEL EXT DVNC XI (MISCELLANEOUS) ×2 IMPLANT
SOLUTION ELECTROLUBE (MISCELLANEOUS) ×3 IMPLANT
SPIKE FLUID TRANSFER (MISCELLANEOUS) IMPLANT
STAPLER 60 DA VINCI SURE FORM (STAPLE)
STAPLER 60 SUREFORM DVNC (STAPLE) IMPLANT
STAPLER CANNULA SEAL DVNC XI (STAPLE) IMPLANT
STAPLER CANNULA SEAL XI (STAPLE)
STAPLER ECHELON POWER CIR 29 (STAPLE) IMPLANT
STAPLER ECHELON POWER CIR 31 (STAPLE) IMPLANT
STAPLER RELOAD 3.5X60 BLU DVNC (STAPLE)
STAPLER RELOAD 3.5X60 BLUE (STAPLE)
STAPLER RELOAD 4.3X60 GREEN (STAPLE)
STAPLER RELOAD 4.3X60 GRN DVNC (STAPLE)
STOPCOCK 4 WAY LG BORE MALE ST (IV SETS) ×6 IMPLANT
SUT ETHILON 2 0 PS N (SUTURE) IMPLANT
SUT NOVA NAB DX-16 0-1 5-0 T12 (SUTURE) ×6 IMPLANT
SUT PROLENE 2 0 KS (SUTURE) IMPLANT
SUT SILK 2 0 (SUTURE) ×2
SUT SILK 2 0 SH CR/8 (SUTURE) IMPLANT
SUT SILK 2-0 18XBRD TIE 12 (SUTURE) ×2 IMPLANT
SUT SILK 3 0 (SUTURE)
SUT SILK 3 0 SH CR/8 (SUTURE) ×3 IMPLANT
SUT SILK 3-0 18XBRD TIE 12 (SUTURE) IMPLANT
SUT V-LOC BARB 180 2/0GR6 GS22 (SUTURE)
SUT VIC AB 2-0 SH 18 (SUTURE) IMPLANT
SUT VIC AB 2-0 SH 27 (SUTURE)
SUT VIC AB 2-0 SH 27X BRD (SUTURE) IMPLANT
SUT VIC AB 3-0 SH 18 (SUTURE) IMPLANT
SUT VIC AB 4-0 PS2 27 (SUTURE) ×6 IMPLANT
SUT VICRYL 0 UR6 27IN ABS (SUTURE) ×3 IMPLANT
SUTURE V-LC BRB 180 2/0GR6GS22 (SUTURE) IMPLANT
SYR 10ML ECCENTRIC (SYRINGE) ×3 IMPLANT
SYS LAPSCP GELPORT 120MM (MISCELLANEOUS)
SYS WOUND ALEXIS 18CM MED (MISCELLANEOUS)
SYSTEM LAPSCP GELPORT 120MM (MISCELLANEOUS) IMPLANT
SYSTEM WOUND ALEXIS 18CM MED (MISCELLANEOUS) IMPLANT
TOWEL OR 17X26 10 PK STRL BLUE (TOWEL DISPOSABLE) IMPLANT
TOWEL OR NON WOVEN STRL DISP B (DISPOSABLE) ×3 IMPLANT
TRAY FOLEY MTR SLVR 16FR STAT (SET/KITS/TRAYS/PACK) ×3 IMPLANT
TROCAR ADV FIXATION 5X100MM (TROCAR) ×3 IMPLANT
TUBING CONNECTING 10 (TUBING) ×6 IMPLANT
TUBING INSUFFLATION 10FT LAP (TUBING) ×3 IMPLANT

## 2021-07-20 NOTE — Anesthesia Postprocedure Evaluation (Signed)
Anesthesia Post Note ? ?Patient: [name] ? ?Procedure(s) Performed: XI ROBOT ASSISTED LAPAROSCOPIC RIGHT COLECTOMY WITH BILATERAL TAP BLOCK AND INTRAOPERATIVE ASSESSMENT OF PERFUSION USING FIREFLYa (Abdomen) ? ?  ? ?Patient location during evaluation: PACU ?Anesthesia Type: General ?Level of consciousness: sedated ?Pain management: pain level controlled ?Vital Signs Assessment: post-procedure vital signs reviewed and stable ?Respiratory status: spontaneous breathing and respiratory function stable ?Cardiovascular status: stable ?Postop Assessment: no apparent nausea or vomiting ?Anesthetic complications: no ? ? ?No notable events documented. ? ?Last Vitals:  ?Vitals:  ? 07/20/21 1737 07/20/21 1745  ?BP: (!) 163/61 (!) 157/61  ?Pulse:  60  ?Resp:  16  ?Temp:  36.6 ?C  ?SpO2:  100%  ?  ?Last Pain:  ?Vitals:  ? 07/20/21 1745  ?TempSrc:   ?PainSc: 0-No pain  ? ? ?  ?  ?  ?  ?  ?  ? ?Jailani Hogans DANIEL ? ? ? ? ?

## 2021-07-20 NOTE — Anesthesia Preprocedure Evaluation (Addendum)
Anesthesia Evaluation  ?Patient identified by MRN, date of birth, ID band ?Patient awake ? ? ? ?Reviewed: ?Allergy & Precautions, NPO status , Patient's Chart, lab work & pertinent test results ? ?History of Anesthesia Complications ?(+) PONV and history of anesthetic complications ? ?Airway ?Mallampati: II ? ?TM Distance: >3 FB ?Neck ROM: Full ? ? ? Dental ? ?(+) Upper Dentures, Edentulous Lower ?  ?Pulmonary ?asthma , former smoker,  ?  ?Pulmonary exam normal ?breath sounds clear to auscultation ? ? ? ? ? ? Cardiovascular ?hypertension, Pt. on medications ?+ dysrhythmias Atrial Fibrillation  ?Rhythm:Regular Rate:Normal ? ?Per cardiology preoperative evaluation 06/20/2021, "Chart reviewed as part of pre-operative protocol coverage. Patient was contacted?06/20/2021?in reference to pre-operative risk assessment for pending surgery as outlined below. ?[name]?was last seen on 05/14/2021?by Dr. Percival Spanish. ?Since that day, [name]?has done well without exertional chest pain or worsening dyspnea.??Recent Myoview was normal. ?? ?Therefore, based on ACC/AHA guidelines, the patient would be at acceptable risk for the planned procedure without further cardiovascular testing.? ?  ?Neuro/Psych ?negative psych ROS  ? GI/Hepatic ?negative GI ROS, Neg liver ROS,   ?Endo/Other  ?Hypothyroidism  ? Renal/GU ?negative Renal ROS  ? ?  ?Musculoskeletal ?negative musculoskeletal ROS ?(+)  ? Abdominal ?(+) + obese,   ?Peds ? Hematology ?  ?Anesthesia Other Findings ? ? Reproductive/Obstetrics ? ?  ? ? ? ? ? ? ? ? ? ? ? ? ? ?  ?  ? ? ? ? ? ? ? ?Anesthesia Physical ? ?Anesthesia Plan ? ?ASA: 3 ? ?Anesthesia Plan: General  ? ?Post-op Pain Management: Celebrex PO (pre-op)* and Tylenol PO (pre-op)*  ? ?Induction: Intravenous ? ?PONV Risk Score and Plan: 3 and Treatment may vary due to age or medical condition, Ondansetron, Dexamethasone and Scopolamine patch - Pre-op ? ?Airway Management Planned:  Oral ETT ? ?Additional Equipment: None ? ?Intra-op Plan:  ? ?Post-operative Plan: Extubation in OR ? ?Informed Consent: I have reviewed the patients History and Physical, chart, labs and discussed the procedure including the risks, benefits and alternatives for the proposed anesthesia with the patient or authorized representative who has indicated his/her understanding and acceptance.  ? ? ? ?Dental advisory given ? ?Plan Discussed with: CRNA and Anesthesiologist ? ?Anesthesia Plan Comments:   ? ? ? ? ? ? ?Anesthesia Quick Evaluation ? ?

## 2021-07-20 NOTE — Anesthesia Procedure Notes (Addendum)
Procedure Name: Intubation ?Date/Time: 07/20/2021 2:46 PM ?Performed by: West Pugh, CRNA ?Pre-anesthesia Checklist: Patient identified, Emergency Drugs available, Suction available, Patient being monitored and Timeout performed ?Patient Re-evaluated:Patient Re-evaluated prior to induction ?Oxygen Delivery Method: Circle system utilized ?Preoxygenation: Pre-oxygenation with 100% oxygen ?Induction Type: IV induction ?Ventilation: Mask ventilation without difficulty ?Laryngoscope Size: Mac and 3 ?Grade View: Grade I ?Tube type: Oral ?Tube size: 7.0 mm ?Number of attempts: 1 ?Airway Equipment and Method: Stylet ?Placement Confirmation: ETT inserted through vocal cords under direct vision, positive ETCO2, CO2 detector and breath sounds checked- equal and bilateral ?Secured at: 21 cm ?Tube secured with: Tape ?Dental Injury: Teeth and Oropharynx as per pre-operative assessment  ? ? ? ? ?

## 2021-07-20 NOTE — H&P (Signed)
MRN: [id number] ?DOB: 10-19-1945 ?DATE OF ENCOUNTER: 06/19/2021 ?  ?Subjective  ?  ?Chief Complaint: Colon Cancer ?  ?  ?  ?History of Present Illness: ?[name] is a 76 y.o. female who is seen today as an office consultation at the request of Dr. Loletha Carrow for evaluation of Colon Cancer ?.   ?76 year old female who underwent colonoscopy recently due to a positive fit test.  She was noted to have a polyp at the ileocecal valve which was completely resected and a mid ascending colon polyp which was biopsied due to concern for cancer.  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  This mass was tattooed distally.  CEA was normal.  CT scan showed no sign of metastatic disease.  Patient is status post laparoscopic cholecystectomy approximately 30 years ago.  Of note she recently had trouble with what appears to be retained stones and needed a ERCP with sphincterotomy.   ?  ?  ?Review of Systems: ?A complete review of systems was obtained from the patient.  I have reviewed this information and discussed as appropriate with the patient.  See HPI as well for other ROS. ?  ?  ?  ?Medical History: ?    ?Past Medical History:  ?Diagnosis Date  ? Asthma, unspecified asthma severity, unspecified whether complicated, unspecified whether persistent    ? Difficult intubation    ? GERD (gastroesophageal reflux disease)    ? Hypertension    ? PAF (paroxysmal atrial fibrillation) (CMS-HCC)    ?  ?  ?There is no problem list on file for this patient. ?  ?  ?     ?Past Surgical History:  ?Procedure Laterality Date  ? bilateral knee surgery Bilateral    ? CESAREAN SECTION N/A    ? gallbladder surgery N/A    ? HYSTERECTOMY      ? tumor removed from left leg removed Left    ?  ?  ?     ?Allergies  ?Allergen Reactions  ? Cocoa Itching  ?    Itching mouth  ? Allergenic Extracts Itching  ? Erythromycin Other (See Comments)  ?    Other reaction(s): Unknown  ? Other Itching  ?    white potatoes, corn   ? Shellfish Containing Products Other (See Comments)  ? Soybean Itching  ? Strawberry Extract Itching  ? Naproxen Sodium Other (See Comments)  ?    Hurt all over  ? Penicillins Rash  ?    Has patient had a PCN reaction causing immediate rash, facial/tongue/throat swelling, SOB or lightheadedness with hypotension: Yes ?Has patient had a PCN reaction causing severe rash involving mucus membranes or skin necrosis: Yes ?Has patient had a PCN reaction that required hospitalization: No ?Has patient had a PCN reaction occurring within the last 10 years: No ?If all of the above answers are "NO", then may proceed with Cephalosporin use.  ? Promethazine Other (See Comments)  ?    Sick  ?Pt and family states her nausea worsens with phenergan rather than improving to the point she states she cant have it.  ?   ?  ?  ?      ?Current Outpatient Medications on File Prior to Visit  ?Medication Sig Dispense Refill  ? flecainide (TAMBOCOR) 100 MG tablet Take 100 mg by mouth 2 (two) times daily      ? omeprazole (PRILOSEC) 10 MG DR capsule Take by mouth      ?  ursodioL (ACTIGALL) 300 mg capsule Take 600 mg by mouth once daily      ? warfarin (COUMADIN) 5 MG tablet TAKE 1 TO 2 TABLETS BY MOUTH ONCE DAILY OR AS DIRECTED BY COUMADIN CLINIC      ?  ?No current facility-administered medications on file prior to visit.  ?  ?  ?     ?Family History  ?Problem Relation Age of Onset  ? Breast cancer Mother    ? Breast cancer Sister    ? Diabetes Brother    ?  ?  ?Social History  ?  ?    ?Tobacco Use  ?Smoking Status Former  ? Types: Cigarettes  ?Smokeless Tobacco Never  ?  ?  ?Social History  ?  ?     ?Socioeconomic History  ? Marital status: Married  ?Tobacco Use  ? Smoking status: Former  ?    Types: Cigarettes  ? Smokeless tobacco: Never  ?Vaping Use  ? Vaping Use: Never used  ?Substance and Sexual Activity  ? Alcohol use: Never  ? Drug use: Never  ?  ?  ?Objective:  ?  ?  ?Vitals:  ? 07/20/21 1213  ?BP: (!) 183/96  ?Pulse: (!) 57  ?Resp:  16  ?Temp: 97.8 ?F (36.6 ?C)  ?SpO2: 99%  ? ? ?Exam ?Gen: NAD ?CV: RRR ?Lungs: CTA ?Abd: soft ?  ?  ?  ?Labs, Imaging and Diagnostic Testing: ?CEA <2 ?  ?CT scans reviewed.  There does not appear to be a lot of adhesive disease in the right upper quadrant. ?  ?  ?Assessment and Plan:  ?Diagnoses and all orders for this visit: ?  ?Colon cancer, ascending (CMS-HCC) ?  ?  ?76 year old female with paroxysmal A-fib who presents to the office today with a newly diagnosed ascending colon cancer.  This was seen on colonoscopy and tattooed.  CT scan showed no sign of metastatic disease.  I have recommended proceeding with a robotic assisted right colectomy.  We discussed possible increased risk of bleeding due to previous cholecystectomy and need for dissection around the liver bed.  CT scan does not appear to show a lot of adhesive disease in the right upper quadrant.  Cardiac clearance was obtained prior to proceeding and get a plan for transitioning her off of her Coumadin prior to surgery. ?  ?The surgery and anatomy were described to the patient as well as the risks of surgery and the possible complications.  These include: Bleeding, deep abdominal infections and possible wound complications such as hernia and infection, damage to adjacent structures, leak of surgical connections, which can lead to other surgeries and possibly an ostomy, possible need for other procedures, such as abscess drains in radiology, possible prolonged hospital stay, possible diarrhea from removal of part of the colon, possible constipation from narcotics, possible bowel, bladder or sexual dysfunction if having rectal surgery, prolonged fatigue/weakness or appetite loss, possible early recurrence of of disease, possible complications of their medical problems such as heart disease or arrhythmias or lung problems, death (less than 1%). I believe the patient understands and wishes to proceed with the surgery. ? ?

## 2021-07-20 NOTE — Op Note (Signed)
07/20/2021 ? ?4:52 PM ? ?PATIENT:  [name]  76 y.o. female ? ?Patient Care Team: ?Kelton Pillar, MD as PCP - General (Family Medicine) ?Minus Breeding, MD as PCP - Cardiology (Cardiology) ? ?PRE-OPERATIVE DIAGNOSIS:  COLON CANCER ? ?POST-OPERATIVE DIAGNOSIS:  COLON CANCER ? ?PROCEDURE:   ?XI ROBOT ASSISTED PARTIAL COLECTOMY  ?BILATERAL TAP BLOCK  ?INTRAOPERATIVE ASSESSMENT OF PERFUSION USING FIREFLY ? ?Surgeon(s): ?Leighton Ruff, MD ?Ileana Roup, MD ? ?ASSISTANT: Dr Dema Severin  ? ?ANESTHESIA:   local and general ? ?EBL: 116m Total I/O ?In: 1900 [I.V.:1300; IV Piggyback:600] ?Out: 130 [Urine:30; Blood:100] ? ?Delay start of Pharmacological VTE agent (>24hrs) due to surgical blood loss or risk of bleeding:  no ? ?DRAINS: none  ? ?SPECIMEN:  Source of Specimen:  R colon ? ?DISPOSITION OF SPECIMEN:  PATHOLOGY ? ?COUNTS:  YES ? ?PLAN OF CARE: Admit to inpatient  ? ?PATIENT DISPOSITION:  PACU - hemodynamically stable. ? ?INDICATION:   ? ?76y.o. F with ascending colon cancer.  I recommended segmental resection: ? ?The anatomy & physiology of the digestive tract was discussed.  The pathophysiology was discussed.  Natural history risks without surgery was discussed.   I worked to give an overview of the disease and the frequent need to have multispecialty involvement.  I feel the risks of no intervention will lead to serious problems that outweigh the operative risks; therefore, I recommended a partial colectomy to remove the pathology.  Laparoscopic & open techniques were discussed.   ?Risks such as bleeding, infection, abscess, leak, reoperation, possible ostomy, hernia, heart attack, death, and other risks were discussed.  I noted a good likelihood this will help address the problem.   Goals of post-operative recovery were discussed as well.   ? ?The patient expressed understanding & wished to proceed with surgery. ? ?OR FINDINGS:  ? ?Patient had tattoo noted in the mid ascending colon. ? ?No obvious  metastatic disease on visceral parietal peritoneum or liver.  Nodular liver.  ? ?DESCRIPTION:  ? ?Informed consent was confirmed.  The patient underwent general anaesthesia without difficulty.  The patient was positioned appropriately.  VTE prevention in place.  The patient's abdomen was clipped, prepped, & draped in a sterile fashion.  Surgical timeout confirmed our plan. ? ?The patient was positioned in reverse Trendelenburg.  Abdominal entry was gained using a Varies needle in the LUQ.  Entry was clean.  I induced carbon dioxide insufflation.  An 890mrobotic port was placed in the LUQ.  Camera inspection revealed no injury.  Extra ports were carefully placed under direct laparoscopic visualization.  I laparoscopically reflected the greater omentum and the upper abdomen the small bowel in the peilvis. The patient was appropriately positioned and the robot was docked to the patient's right side.  Instruments were placed under direct visualization.  ? ? I began by taking down omental adhesions in the pelvis.  I then reflected the omentum and transverse colon in the upper abdomen.  I identified the ileocolic artery and vein within the mesentery. Dissection was bluntly carried around these structures. The duodenum was identified and free from the structures. I then separated the structures bluntly and used the robotic vessel sealer device to transect these.  I developed the retroperitoneal plane bluntly.  I then freed the appendix off its attachments to the pelvic wall. I mobilized the terminal ileum.  I took care to avoid injuring any retroperitoneal structures.  After this I began to mobilize laterally down the white line of [place]  and then took down the hepatic flexure using the Enseal device. I mobilized the omentum off of the right transverse colon. The entire colon was then flipped medially and mobilized off of the retroperitoneal structures until I could visualize the lateral edge of the duodenum underneath.  I  gently freed the duodenal attachments.  ? ?I identified a portion of mesentery of the transverse colon just proximal to the right branch of the middle colic.  I divided up to the colon from the previous dissection of the mesentery using the robotic vessel sealer.  I then divided the terminal ileal mesentery in similar fashion.  At that point, the terminal ileum was divided with a blue load robotic 60 mm stapler.  The transverse colon was divided with a green load robotic stapler.  The specimen was then completely free and placed in the right upper quadrant.  Hemostasis was good. I then oriented the remaining terminal ileum and transverse colon and a isoperistaltic fashion.  I confirmed excellent perfusion of the colon and ileum using firefly injected intravenously.  I placed an enterotomy in the small bowel and colon using the robotic scissors.  I then introduced a white load 60 mm robotic stapler into both enterotomies and created an anastomosis between the small bowel and transverse colon.  Hemostasis within the staple line was good.  The common enterotomy channel was closed using 2 running 2-0 V-Loc sutures.  The abdomen was then irrigated with normal saline.  Hemostasis was good throughout all quadrants of the abdomen.  The omentum was then brought down over the anastomosis.  At this point the robot was undocked.  The 12 mm suprapubic port was enlarged to a Pfannenstiel incision and an [place] wound protector was placed.  The specimen was removed from the abdomen and evaluated.  Once the abdomen was inspected for hemostasis again, the [place] wound protector was removed.   ? ?The peritoneum of the Pfannenstiel incision was closed using a running 0 Vicryl suture.  The fascia was then closed using #1 Novafil running sutures.  The subcutaneous tissue of the extraction incision was closed using a running 2-0 Vicryl suture. The skin was then closed using running subcuticular 4-0 Vicryl sutures.  A sterile dressing was  applied.  The remaining port sites were closed using interrupted 4-0 Vicryl sutures and Dermabond. All counts were correct per operating room staff. The patient was then awakened from anesthesia and sent to the post anesthesia care unit in stable condition.    ? ?Rosario Adie, MD ? ?Colorectal and General Surgery ?[facility]  ?

## 2021-07-20 NOTE — Transfer of Care (Signed)
Immediate Anesthesia Transfer of Care Note ? ?Patient: [name] ? ?Procedure(s) Performed: XI ROBOT ASSISTED LAPAROSCOPIC RIGHT COLECTOMY WITH BILATERAL TAP BLOCK AND INTRAOPERATIVE ASSESSMENT OF PERFUSION USING FIREFLYa (Abdomen) ? ?Patient Location: PACU ? ?Anesthesia Type:General ? ?Level of Consciousness: awake, drowsy and patient cooperative ? ?Airway & Oxygen Therapy: Patient Spontanous Breathing and Patient connected to face mask oxygen ? ?Post-op Assessment: Report given to RN and Post -op Vital signs reviewed and stable ? ?Post vital signs: Reviewed and stable ? ?Last Vitals:  ?Vitals Value Taken Time  ?BP 154/65 07/20/21 1710  ?Temp 36.4 ?C 07/20/21 1710  ?Pulse 64 07/20/21 1711  ?Resp 14 07/20/21 1711  ?SpO2 100 % 07/20/21 1711  ?Vitals shown include unvalidated device data. ? ?Last Pain:  ?Vitals:  ? 07/20/21 1231  ?TempSrc:   ?PainSc: 0-No pain  ?   ? ?  ? ?Complications: No notable events documented. ?

## 2021-07-21 ENCOUNTER — Other Ambulatory Visit: Payer: Self-pay

## 2021-07-21 LAB — CBC
HCT: 32.1 % — ABNORMAL LOW (ref 36.0–46.0)
Hemoglobin: 10.6 g/dL — ABNORMAL LOW (ref 12.0–15.0)
MCH: 31.2 pg (ref 26.0–34.0)
MCHC: 33 g/dL (ref 30.0–36.0)
MCV: 94.4 fL (ref 80.0–100.0)
Platelets: 174 10*3/uL (ref 150–400)
RBC: 3.4 MIL/uL — ABNORMAL LOW (ref 3.87–5.11)
RDW: 13.9 % (ref 11.5–15.5)
WBC: 20.5 10*3/uL — ABNORMAL HIGH (ref 4.0–10.5)
nRBC: 0 % (ref 0.0–0.2)

## 2021-07-21 LAB — BASIC METABOLIC PANEL
Anion gap: 8 (ref 5–15)
BUN: 17 mg/dL (ref 8–23)
CO2: 26 mmol/L (ref 22–32)
Calcium: 8.4 mg/dL — ABNORMAL LOW (ref 8.9–10.3)
Chloride: 105 mmol/L (ref 98–111)
Creatinine, Ser: 1.14 mg/dL — ABNORMAL HIGH (ref 0.44–1.00)
GFR, Estimated: 51 mL/min — ABNORMAL LOW (ref >60)
Glucose, Bld: 143 mg/dL — ABNORMAL HIGH (ref 70–99)
Potassium: 3.9 mmol/L (ref 3.5–5.1)
Sodium: 139 mmol/L (ref 135–145)

## 2021-07-21 NOTE — Plan of Care (Signed)
  Problem: Education: Goal: Required Educational Video(s) Outcome: Progressing   Problem: Clinical Measurements: Goal: Postoperative complications will be avoided or minimized Outcome: Progressing   Problem: Skin Integrity: Goal: Demonstration of wound healing without infection will improve Outcome: Progressing   

## 2021-07-21 NOTE — Progress Notes (Signed)
1 Day Post-Op rob R colectomy ?Subjective: ?Having some pain with movement, pain meds working ok, Tolerating fulls, no flatus or BM yet ? ?Objective: ?Vital signs in last 24 hours: ?Temp:  [97.6 ?F (36.4 ?C)-98.7 ?F (37.1 ?C)] 98.7 ?F (37.1 ?C) (04/15 8676) ?Pulse Rate:  [57-78] 59 (04/15 0918) ?Resp:  [14-18] 15 (04/15 1950) ?BP: (114-183)/(49-96) 116/49 (04/15 9326) ?SpO2:  [93 %-100 %] 96 % (04/15 0918) ?Weight:  [93 kg-95.1 kg] 95.1 kg (04/15 0500) ? ? ?Intake/Output from previous day: ?04/14 0701 - 04/15 0700 ?In: 2498.2 [P.O.:120; I.V.:1678.2; IV Piggyback:700] ?Out: 830 [Urine:730; Blood:100] ?Intake/Output this shift: ?No intake/output data recorded. ? ? ?General appearance: alert and cooperative ?GI: normal findings: soft, non-distended ? ?Incision: no significant drainage ? ?Lab Results:  ?Recent Labs  ?  07/21/21 ?7124  ?WBC 20.5*  ?HGB 10.6*  ?HCT 32.1*  ?PLT 174  ? ?BMET ?Recent Labs  ?  07/21/21 ?5809  ?NA 139  ?K 3.9  ?CL 105  ?CO2 26  ?GLUCOSE 143*  ?BUN 17  ?CREATININE 1.14*  ?CALCIUM 8.4*  ? ?PT/INR ?Recent Labs  ?  07/20/21 ?1220  ?LABPROT 15.0  ?INR 1.2  ? ?ABG ?No results for input(s): PHART, HCO3 in the last 72 hours. ? ?Invalid input(s): PCO2, PO2 ? ?MEDS, Scheduled ? acetaminophen  1,000 mg Oral Q6H  ? alvimopan  12 mg Oral BID  ? enoxaparin (LOVENOX) injection  40 mg Subcutaneous Q24H  ? feeding supplement  237 mL Oral BID BM  ? flecainide  100 mg Oral BID  ? gabapentin  300 mg Oral BID  ? pantoprazole  40 mg Oral Daily  ? saccharomyces boulardii  250 mg Oral BID  ? ? ?Studies/Results: ?No results found. ? ?Assessment: ?s/p Procedure(s): ?XI ROBOT ASSISTED LAPAROSCOPIC RIGHT COLECTOMY WITH BILATERAL TAP BLOCK AND INTRAOPERATIVE ASSESSMENT OF PERFUSION USING FIREFLYa ?Patient Active Problem List  ? Diagnosis Date Noted  ? Colon cancer ([place]) 07/20/2021  ? Abnormal magnetic resonance cholangiopancreatography (MRCP)   ? Jaundice   ? Prolonged QT interval 03/13/2021  ? Abnormal pancreas function  test   ? Choledocholithiasis   ? Abdominal pain 07/12/2020  ? Hypokalemia 07/12/2020  ? Hyponatremia 07/12/2020  ? Coagulopathy ([place]) 07/12/2020  ? Abdominal pain, epigastric 06/15/2020  ? Abnormal finding on imaging 06/15/2020  ? Educated about COVID-19 virus infection 05/04/2019  ? Allergic asthma 06/12/2010  ? 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) 06/12/2010  ? Perennial allergic rhinitis with seasonal variation 05/04/2010  ? UNSPECIFIED TACHYCARDIA 12/19/2009  ? Obesity, unspecified 09/16/2008  ? Hypothyroidism 09/15/2008  ? Mixed hyperlipidemia 09/15/2008  ? Essential hypertension 09/15/2008  ? AF (paroxysmal atrial fibrillation) ([place]) 09/15/2008  ? ? ?Expected post op course ? ?Plan: ?d/c foley ?Cont fulls ?SL IVF's ? ? LOS: 1 day  ? ? ? ?Marland KitchenRosario Adie, MD ?[facility], [place] ? ? ? ?07/21/2021 ?9:41 AM ? ? ? ?  ?

## 2021-07-22 LAB — CBC
HCT: 29.5 % — ABNORMAL LOW (ref 36.0–46.0)
Hemoglobin: 9.7 g/dL — ABNORMAL LOW (ref 12.0–15.0)
MCH: 30.9 pg (ref 26.0–34.0)
MCHC: 32.9 g/dL (ref 30.0–36.0)
MCV: 93.9 fL (ref 80.0–100.0)
Platelets: 149 10*3/uL — ABNORMAL LOW (ref 150–400)
RBC: 3.14 MIL/uL — ABNORMAL LOW (ref 3.87–5.11)
RDW: 14.3 % (ref 11.5–15.5)
WBC: 14.7 10*3/uL — ABNORMAL HIGH (ref 4.0–10.5)
nRBC: 0 % (ref 0.0–0.2)

## 2021-07-22 LAB — BASIC METABOLIC PANEL
Anion gap: 9 (ref 5–15)
BUN: 21 mg/dL (ref 8–23)
CO2: 26 mmol/L (ref 22–32)
Calcium: 8.2 mg/dL — ABNORMAL LOW (ref 8.9–10.3)
Chloride: 102 mmol/L (ref 98–111)
Creatinine, Ser: 1.33 mg/dL — ABNORMAL HIGH (ref 0.44–1.00)
GFR, Estimated: 42 mL/min — ABNORMAL LOW (ref >60)
Glucose, Bld: 89 mg/dL (ref 70–99)
Potassium: 3.2 mmol/L — ABNORMAL LOW (ref 3.5–5.1)
Sodium: 137 mmol/L (ref 135–145)

## 2021-07-22 MED ORDER — KCL IN DEXTROSE-NACL 20-5-0.45 MEQ/L-%-% IV SOLN
INTRAVENOUS | Status: DC
  Filled 2021-07-22 (×4): qty 1000

## 2021-07-22 NOTE — Progress Notes (Signed)
2 Days Post-Op rob R colectomy ?Subjective: ?Having some pain with movement, pain meds working ok, Tolerating fulls and having bowel function.  Had trouble this am getting off the toilet due to knee pain ? ?Objective: ?Vital signs in last 24 hours: ?Temp:  [98.1 ?F (36.7 ?C)-98.7 ?F (37.1 ?C)] 98.7 ?F (37.1 ?C) (04/16 0511) ?Pulse Rate:  [56-59] 59 (04/16 0511) ?Resp:  [15-18] 18 (04/16 0511) ?BP: (102-123)/(37-49) 102/37 (04/16 0511) ?SpO2:  [94 %-96 %] 94 % (04/16 0511) ?Weight:  [95.1 kg-95.5 kg] 95.5 kg (04/16 0500) ? ? ?Intake/Output from previous day: ?04/15 0701 - 04/16 0700 ?In: 86 [P.O.:730] ?Out: 400 [Urine:400] ?Intake/Output this shift: ?No intake/output data recorded. ? ? ?General appearance: alert and cooperative ?GI: normal findings: soft, non-distended ? ?Incision: no significant drainage ? ?Lab Results:  ?Recent Labs  ?  07/21/21 ?0762 07/22/21 ?2633  ?WBC 20.5* 14.7*  ?HGB 10.6* 9.7*  ?HCT 32.1* 29.5*  ?PLT 174 149*  ? ? ?BMET ?Recent Labs  ?  07/21/21 ?3545 07/22/21 ?0434  ?NA 139 137  ?K 3.9 3.2*  ?CL 105 102  ?CO2 26 26  ?GLUCOSE 143* 89  ?BUN 17 21  ?CREATININE 1.14* 1.33*  ?CALCIUM 8.4* 8.2*  ? ? ?PT/INR ?Recent Labs  ?  07/20/21 ?1220  ?LABPROT 15.0  ?INR 1.2  ? ? ?ABG ?No results for input(s): PHART, HCO3 in the last 72 hours. ? ?Invalid input(s): PCO2, PO2 ? ?MEDS, Scheduled ? acetaminophen  1,000 mg Oral Q6H  ? alvimopan  12 mg Oral BID  ? enoxaparin (LOVENOX) injection  40 mg Subcutaneous Q24H  ? feeding supplement  237 mL Oral BID BM  ? flecainide  100 mg Oral BID  ? gabapentin  300 mg Oral BID  ? pantoprazole  40 mg Oral Daily  ? saccharomyces boulardii  250 mg Oral BID  ? ? ?Studies/Results: ?No results found. ? ?Assessment: ?s/p Procedure(s): ?XI ROBOT ASSISTED LAPAROSCOPIC RIGHT COLECTOMY WITH BILATERAL TAP BLOCK AND INTRAOPERATIVE ASSESSMENT OF PERFUSION USING FIREFLYa ?Patient Active Problem List  ? Diagnosis Date Noted  ? Colon cancer ([place]) 07/20/2021  ? Abnormal magnetic  resonance cholangiopancreatography (MRCP)   ? Jaundice   ? Prolonged QT interval 03/13/2021  ? Abnormal pancreas function test   ? Choledocholithiasis   ? Abdominal pain 07/12/2020  ? Hypokalemia 07/12/2020  ? Hyponatremia 07/12/2020  ? Coagulopathy ([place]) 07/12/2020  ? Abdominal pain, epigastric 06/15/2020  ? Abnormal finding on imaging 06/15/2020  ? Educated about COVID-19 virus infection 05/04/2019  ? Allergic asthma 06/12/2010  ? 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) 06/12/2010  ? Perennial allergic rhinitis with seasonal variation 05/04/2010  ? UNSPECIFIED TACHYCARDIA 12/19/2009  ? Obesity, unspecified 09/16/2008  ? Hypothyroidism 09/15/2008  ? Mixed hyperlipidemia 09/15/2008  ? Essential hypertension 09/15/2008  ? AF (paroxysmal atrial fibrillation) ([place]) 09/15/2008  ? ? ?Expected post op course ? ?Plan: ?Advance to soft diet ?Restart IVF's ?Minimize pain meds ?PT consult for assessment of home needs and ambulation safety ? ? LOS: 2 days  ? ? ? ?Marland KitchenRosario Adie, MD ?[facility], [place] ? ? ? ?07/22/2021 ?8:30 AM ? ? ? ?  ?

## 2021-07-22 NOTE — Progress Notes (Addendum)
?   07/22/21 0450  ?What Happened  ?Was fall witnessed? Yes  ?Who witnessed fall? Barrie Dunker NT  ?Patients activity before fall bathroom-assisted  ?Point of contact buttocks  ?Was patient injured? No  ?Follow Up  ?MD notified Leighton Ruff ?(paged MD)  ?Time MD notified [phone]  ?Family notified Yes - comment ?Darrold Span)  ?Time family notified 57  ?Additional tests  ?(No injury or pain)  ?Simple treatment  ?(no c/o pain or injury)  ?Progress note created (see row info) Yes  ?Adult Fall Risk Assessment  ?Risk Factor Category (scoring not indicated) Fall has occurred during this admission (document High fall risk)  ?Age 76  ?Fall History: Fall within 6 months prior to admission 0  ?Elimination; Bowel and/or Urine Incontinence 0  ?Elimination; Bowel and/or Urine Urgency/Frequency 2  ?Medications: includes PCA/Opiates, Anti-convulsants, Anti-hypertensives, Diuretics, Hypnotics, Laxatives, Sedatives, and Psychotropics 3  ?Patient Care Equipment 2  ?Mobility-Assistance 2  ?Mobility-Gait 2  ?Mobility-Sensory Deficit 0  ?Altered awareness of immediate physical environment 0  ?Impulsiveness 0  ?Lack of understanding of one's physical/cognitive limitations 0  ?Total Score 13  ?Patient Fall Risk Level High fall risk  ?Adult Fall Risk Interventions  ?Required Bundle Interventions *See Row Information* High fall risk - low, moderate, and high requirements implemented  ?Additional Interventions Use of appropriate toileting equipment (bedpan, BSC, etc.);Room near nurses station  ?Screening for Fall Injury Risk (To be completed on HIGH fall risk patients) - Assessing Need for Floor Mats  ?Risk For Fall Injury- Criteria for Floor Mats Previous fall this admission  ?Will Implement Floor Mats Yes  ?Pain Assessment  ?Pain Scale 0-10  ?Pain Score 0  ?PCA/Epidural/Spinal Assessment  ?Respiratory Pattern Regular;Unlabored;Symmetrical  ?Neurological  ?Neuro (WDL) WDL  ?Level of Consciousness Alert  ?Orientation Level Oriented  X4  ?Musculoskeletal  ?Musculoskeletal (WDL) X  ?Assistive Device Front wheel walker  ?Integumentary  ?Integumentary (WDL) X  ?Skin Color Appropriate for ethnicity  ?Skin Condition Dry  ?Skin Integrity  ?(surgical incision, dressing Dry, clean and intact)  ?Skin Turgor Non-tenting  ? ?V/S-T-98.7, B/P-102/37, P-59,R-18 and O2 sats 94%RA. Patient did not hit her head/knee or buttocks.No c/o pain or injury noted.  ?

## 2021-07-23 ENCOUNTER — Other Ambulatory Visit: Payer: Self-pay

## 2021-07-23 LAB — CBC
HCT: 32.4 % — ABNORMAL LOW (ref 36.0–46.0)
Hemoglobin: 10.5 g/dL — ABNORMAL LOW (ref 12.0–15.0)
MCH: 30.7 pg (ref 26.0–34.0)
MCHC: 32.4 g/dL (ref 30.0–36.0)
MCV: 94.7 fL (ref 80.0–100.0)
Platelets: 163 10*3/uL (ref 150–400)
RBC: 3.42 MIL/uL — ABNORMAL LOW (ref 3.87–5.11)
RDW: 14.3 % (ref 11.5–15.5)
WBC: 15.2 10*3/uL — ABNORMAL HIGH (ref 4.0–10.5)
nRBC: 0 % (ref 0.0–0.2)

## 2021-07-23 LAB — BASIC METABOLIC PANEL
Anion gap: 8 (ref 5–15)
BUN: 23 mg/dL (ref 8–23)
CO2: 24 mmol/L (ref 22–32)
Calcium: 8 mg/dL — ABNORMAL LOW (ref 8.9–10.3)
Chloride: 100 mmol/L (ref 98–111)
Creatinine, Ser: 1.43 mg/dL — ABNORMAL HIGH (ref 0.44–1.00)
GFR, Estimated: 38 mL/min — ABNORMAL LOW (ref >60)
Glucose, Bld: 106 mg/dL — ABNORMAL HIGH (ref 70–99)
Potassium: 3.7 mmol/L (ref 3.5–5.1)
Sodium: 132 mmol/L — ABNORMAL LOW (ref 135–145)

## 2021-07-23 NOTE — Evaluation (Signed)
Physical Therapy Evaluation ?Patient Details ?Name: [name] ?MRN: [id number] ?DOB: [birth year] ?Today's Date: 07/23/2021 ? ?History of Present Illness ? 76 year old female who underwent colonoscopy recently due to a positive fit test.  She was noted to have a polyp at the ileocecal valve which was completely resected and a mid ascending colon polyp which was biopsied due to concern for cancer.  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 s/p colectomy on 4/14  ?Clinical Impression ? Pt admitted with above diagnosis.  ?Pt amb hallway distance wth min to min/guard assist and RW support. Has RW at home, normally independent, community ambulator. May benefit from HHPT if d/c's next few days  ? Pt currently with functional limitations due to the deficits listed below (see PT Problem List). Pt will benefit from skilled PT to increase their independence and safety with mobility to allow discharge to the venue listed below.   ?   ?   ? ?Recommendations for follow up therapy are one component of a multi-disciplinary discharge planning process, led by the attending physician.  Recommendations may be updated based on patient status, additional functional criteria and insurance authorization. ? ?Follow Up Recommendations Home health PT ? ?  ?Assistance Recommended at Discharge Intermittent Supervision/Assistance  ?Patient can return home with the following ? Assistance with cooking/housework;Assist for transportation;Help with stairs or ramp for entrance ? ?  ?Equipment Recommendations None recommended by PT  ?Recommendations for Other Services ?    ?  ?Functional Status Assessment Patient has had a recent decline in their functional status and demonstrates the ability to make significant improvements in function in a reasonable and predictable amount of time.  ? ?  ?Precautions / Restrictions Precautions ?Precautions: Fall ?Restrictions ?Weight Bearing Restrictions: No  ? ?   ? ?Mobility ? Bed Mobility ?Overal bed mobility: Needs Assistance ?Bed Mobility: Rolling, Sidelying to Sit ?Rolling: Min guard ?Sidelying to sit: Min guard ?  ?  ?  ?General bed mobility comments: incr time, use of rail, cues to log roll to decr stress abdominal area ?  ? ?Transfers ?Overall transfer level: Needs assistance ?Equipment used: Rolling walker (2 wheels) ?Transfers: Sit to/from Stand ?Sit to Stand: Min guard ?  ?  ?  ?  ?  ?General transfer comment: cues for hand placement and fully back up to surface ?  ? ?Ambulation/Gait ?Ambulation/Gait assistance: Min guard, Min assist ?Gait Distance (Feet): 80 Feet ?Assistive device: Rolling walker (2 wheels) ?Gait Pattern/deviations: Step-through pattern, Decreased stride length ?  ?  ?  ?General Gait Details: very light assist to maneuver RW, steady gait with UE support, cues for RW position. distance limited by knee discomfort ? ?Stairs ?  ?  ?  ?  ?  ? ?Wheelchair Mobility ?  ? ?Modified Rankin (Stroke Patients Only) ?  ? ?  ? ?Balance Overall balance assessment: Mild deficits observed, not formally tested ?  ?  ?  ?  ?  ?  ?  ?  ?  ?  ?  ?  ?  ?  ?  ?  ?  ?  ?   ? ? ? ?Pertinent Vitals/Pain    ? ? ?Home Living Family/patient expects to be discharged to:: Private residence ?  ?Available Help at Discharge: Family ?Type of Home: House ?Home Access: Stairs to enter ?Entrance Stairs-Rails: Right;Left;Can reach both ?Entrance Stairs-Number of Steps: 3 ?  ?Home Layout: One level ?Home Equipment: Conservation officer, nature (2 wheels);Wheelchair - manual;Shower seat ?   ?  ?  Prior Function Prior Level of Function : Independent/Modified Independent ?  ?  ?  ?  ?  ?  ?  ?  ?  ? ? ?Hand Dominance  ?   ? ?  ?Extremity/Trunk Assessment  ? Upper Extremity Assessment ?Upper Extremity Assessment: Overall WFL for tasks assessed ?  ? ?Lower Extremity Assessment ?Lower Extremity Assessment: Overall WFL for tasks assessed ?  ? ?Cervical / Trunk Assessment ?Cervical / Trunk Assessment: Normal   ?Communication  ? Communication: No difficulties  ?Cognition Arousal/Alertness: Awake/alert ?Behavior During Therapy: [facility] for tasks assessed/performed ?Overall Cognitive Status: Within Functional Limits for tasks assessed ?  ?  ?  ?  ?  ?  ?  ?  ?  ?  ?  ?  ?  ?  ?  ?  ?  ?  ?  ? ?  ?General Comments   ? ?  ?Exercises    ? ?Assessment/Plan  ?  ?PT Assessment Patient needs continued PT services  ?PT Problem List Decreased strength;Decreased mobility;Decreased range of motion;Decreased activity tolerance;Decreased balance;Decreased knowledge of use of DME;Pain ? ?   ?  ?PT Treatment Interventions DME instruction;Therapeutic exercise;Gait training;Functional mobility training;Therapeutic activities;Patient/family education   ? ?PT Goals (Current goals can be found in the Care Plan section)  ?Acute Rehab PT Goals ?Patient Stated Goal: home ?PT Goal Formulation: With patient ?Time For Goal Achievement: 08/06/21 ?Potential to Achieve Goals: Good ? ?  ?Frequency Min 3X/week ?  ? ? ?Co-evaluation   ?  ?  ?  ?  ? ? ?  ?AM-PAC PT "6 Clicks" Mobility  ?Outcome Measure Help needed turning from your back to your side while in a flat bed without using bedrails?: A Little ?Help needed moving from lying on your back to sitting on the side of a flat bed without using bedrails?: A Little ?Help needed moving to and from a bed to a chair (including a wheelchair)?: A Little ?Help needed standing up from a chair using your arms (e.g., wheelchair or bedside chair)?: A Little ?Help needed to walk in hospital room?: A Little ?Help needed climbing 3-5 steps with a railing? : A Little ?6 Click Score: 18 ? ?  ?End of Session Equipment Utilized During Treatment: Gait belt ?Activity Tolerance: Patient tolerated treatment well ?Patient left: in chair;with bed alarm set;with chair alarm set ?Nurse Communication: Mobility status ?PT Visit Diagnosis: Other abnormalities of gait and mobility (R26.89);Difficulty in walking, not elsewhere classified  (R26.2) ?  ? ?Time: 1001-1025 ?PT Time Calculation (min) (ACUTE ONLY): 24 min ? ? ?Charges:   PT Evaluation ?$PT Eval Low Complexity: 1 Low ?PT Treatments ?$Gait Training: 8-22 mins ?  ?   ? ? ?Baxter Flattery, PT ? ?Acute Rehab Dept [facility]) [phone] ?Pager [phone] ? ?07/23/2021 ? ? ?Julie Paolini ?07/23/2021, 10:33 AM ? ?

## 2021-07-23 NOTE — Progress Notes (Signed)
3 Days Post-Op rob R colectomy ?Subjective: ?Having some pain with movement, pain meds working ok, Photographer a soft diet and having bowel function.   ?Objective: ?Vital signs in last 24 hours: ?Temp:  [98.2 ?F (36.8 ?C)-99.9 ?F (37.7 ?C)] 98.2 ?F (36.8 ?C) (04/17 0527) ?Pulse Rate:  [60-66] 60 (04/17 0527) ?Resp:  [14-16] 14 (04/17 0527) ?BP: (115-135)/(42-60) 135/55 (04/17 0527) ?SpO2:  [96 %-97 %] 97 % (04/17 0527) ?Weight:  [97.9 kg] 97.9 kg (04/17 0527) ? ? ?Intake/Output from previous day: ?04/16 0701 - 04/17 0700 ?In: 1975.4 [P.O.:510; I.V.:1465.4] ?Out: 1500 [Urine:1500] ?Intake/Output this shift: ?No intake/output data recorded. ? ? ?General appearance: alert and cooperative ?GI: normal findings: soft, non-distended ? ?Incision: no significant drainage ? ?Lab Results:  ?Recent Labs  ?  07/22/21 ?8110 07/23/21 ?0432  ?WBC 14.7* 15.2*  ?HGB 9.7* 10.5*  ?HCT 29.5* 32.4*  ?PLT 149* 163  ? ? ?BMET ?Recent Labs  ?  07/22/21 ?0434 07/23/21 ?0432  ?NA 137 132*  ?K 3.2* 3.7  ?CL 102 100  ?CO2 26 24  ?GLUCOSE 89 106*  ?BUN 21 23  ?CREATININE 1.33* 1.43*  ?CALCIUM 8.2* 8.0*  ? ? ?PT/INR ?Recent Labs  ?  07/20/21 ?1220  ?LABPROT 15.0  ?INR 1.2  ? ? ?ABG ?No results for input(s): PHART, HCO3 in the last 72 hours. ? ?Invalid input(s): PCO2, PO2 ? ?MEDS, Scheduled ? acetaminophen  1,000 mg Oral Q6H  ? enoxaparin (LOVENOX) injection  40 mg Subcutaneous Q24H  ? feeding supplement  237 mL Oral BID BM  ? flecainide  100 mg Oral BID  ? gabapentin  300 mg Oral BID  ? pantoprazole  40 mg Oral Daily  ? saccharomyces boulardii  250 mg Oral BID  ? ? ?Studies/Results: ?No results found. ? ?Assessment: ?s/p Procedure(s): ?XI ROBOT ASSISTED LAPAROSCOPIC RIGHT COLECTOMY WITH BILATERAL TAP BLOCK AND INTRAOPERATIVE ASSESSMENT OF PERFUSION USING FIREFLYa ?Patient Active Problem List  ? Diagnosis Date Noted  ? Colon cancer ([place]) 07/20/2021  ? Abnormal magnetic resonance cholangiopancreatography (MRCP)   ? Jaundice   ? Prolonged QT interval  03/13/2021  ? Abnormal pancreas function test   ? Choledocholithiasis   ? Abdominal pain 07/12/2020  ? Hypokalemia 07/12/2020  ? Hyponatremia 07/12/2020  ? Coagulopathy ([place]) 07/12/2020  ? Abdominal pain, epigastric 06/15/2020  ? Abnormal finding on imaging 06/15/2020  ? Educated about COVID-19 virus infection 05/04/2019  ? Allergic asthma 06/12/2010  ? 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) 06/12/2010  ? Perennial allergic rhinitis with seasonal variation 05/04/2010  ? UNSPECIFIED TACHYCARDIA 12/19/2009  ? Obesity, unspecified 09/16/2008  ? Hypothyroidism 09/15/2008  ? Mixed hyperlipidemia 09/15/2008  ? Essential hypertension 09/15/2008  ? AF (paroxysmal atrial fibrillation) ([place]) 09/15/2008  ? ? ?Expected post op course ? ?Plan: ?Cont soft diet ?AKI: most likely due to dehydration and bowel prep.  Cont IVF's ?Minimize pain meds ?PT consult for assessment of home needs and ambulation safety ? ? LOS: 3 days  ? ? ? ?Marland KitchenRosario Adie, MD ?[facility], [place] ? ? ? ?07/23/2021 ?8:17 AM ? ? ? ?  ?

## 2021-07-23 NOTE — Progress Notes (Signed)
?  Transition of Care (TOC) Screening Note ? ? ?Patient Details  ?Name: [name] ?Date of Birth: 04-22-1945 ? ? ?Transition of Care (TOC) CM/SW Contact:    ?Machai Desmith, LCSW ?Phone Number: ?07/23/2021, 1:32 PM ? ? ? ?Transition of Care Department [facility]) has reviewed patient and no TOC needs have been identified at this time. We will continue to monitor patient advancement through interdisciplinary progression rounds. If new patient transition needs arise, please place a TOC consult. ? ? ?

## 2021-07-24 ENCOUNTER — Other Ambulatory Visit (HOSPITAL_COMMUNITY): Payer: Self-pay

## 2021-07-24 LAB — BASIC METABOLIC PANEL
Anion gap: 5 (ref 5–15)
BUN: 23 mg/dL (ref 8–23)
CO2: 23 mmol/L (ref 22–32)
Calcium: 7.9 mg/dL — ABNORMAL LOW (ref 8.9–10.3)
Chloride: 105 mmol/L (ref 98–111)
Creatinine, Ser: 1.06 mg/dL — ABNORMAL HIGH (ref 0.44–1.00)
GFR, Estimated: 55 mL/min — ABNORMAL LOW (ref >60)
Glucose, Bld: 100 mg/dL — ABNORMAL HIGH (ref 70–99)
Potassium: 3.9 mmol/L (ref 3.5–5.1)
Sodium: 133 mmol/L — ABNORMAL LOW (ref 135–145)

## 2021-07-24 LAB — CBC
HCT: 29.7 % — ABNORMAL LOW (ref 36.0–46.0)
Hemoglobin: 9.6 g/dL — ABNORMAL LOW (ref 12.0–15.0)
MCH: 30.1 pg (ref 26.0–34.0)
MCHC: 32.3 g/dL (ref 30.0–36.0)
MCV: 93.1 fL (ref 80.0–100.0)
Platelets: 158 10*3/uL (ref 150–400)
RBC: 3.19 MIL/uL — ABNORMAL LOW (ref 3.87–5.11)
RDW: 14.2 % (ref 11.5–15.5)
WBC: 12.2 10*3/uL — ABNORMAL HIGH (ref 4.0–10.5)
nRBC: 0 % (ref 0.0–0.2)

## 2021-07-24 MED ORDER — OXYCODONE HCL 5 MG PO TABS
5.0000 mg | ORAL_TABLET | Freq: Four times a day (QID) | ORAL | 0 refills | Status: DC | PRN
  Filled 2021-07-24: qty 20, 5d supply, fill #0

## 2021-07-24 NOTE — Discharge Instructions (Signed)
SURGERY: POST OP INSTRUCTIONS (Surgery for small bowel obstruction, colon resection, etc)   ######################################################################  EAT Gradually transition to a high fiber diet with a fiber supplement over the next few days after discharge  WALK Walk an hour a day.  Control your pain to do that.    CONTROL PAIN Control pain so that you can walk, sleep, tolerate sneezing/coughing, go up/down stairs.  HAVE A BOWEL MOVEMENT DAILY Keep your bowels regular to avoid problems.  OK to try a laxative to override constipation.  OK to use an antidairrheal to slow down diarrhea.  Call if not better after 2 tries  CALL IF YOU HAVE PROBLEMS/CONCERNS Call if you are still struggling despite following these instructions. Call if you have concerns not answered by these instructions  ######################################################################   DIET Follow a light diet the first few days at home.  Start with a bland diet such as soups, liquids, starchy foods, low fat foods, etc.  If you feel full, bloated, or constipated, stay on a ful liquid or pureed/blenderized diet for a few days until you feel better and no longer constipated. Be sure to drink plenty of fluids every day to avoid getting dehydrated (feeling dizzy, not urinating, etc.). Gradually add a fiber supplement to your diet over the next week.  Gradually get back to a regular solid diet.  Avoid fast food or heavy meals the first week as you are more likely to get nauseated. It is expected for your digestive tract to need a few months to get back to normal.  It is common for your bowel movements and stools to be irregular.  You will have occasional bloating and cramping that should eventually fade away.  Until you are eating solid food normally, off all pain medications, and back to regular activities; your bowels will not be normal. Focus on eating a low-fat, high fiber diet the rest of your life  (See Getting to Good Bowel Health, below).  CARE of your INCISION or WOUND  It is good for closed incisions and even open wounds to be washed every day.  Shower every day.  Short baths are fine.  Wash the incisions and wounds clean with soap & water.    You may leave closed incisions open to air if it is dry.   You may cover the incision with clean gauze & replace it after your daily shower for comfort.  STAPLES: You have skin staples.  Leave them in place & set up an appointment for them to be removed by a surgery office nurse ~10 days after surgery. = 1st week of January 2024    ACTIVITIES as tolerated Start light daily activities --- self-care, walking, climbing stairs-- beginning the day after surgery.  Gradually increase activities as tolerated.  Control your pain to be active.  Stop when you are tired.  Ideally, walk several times a day, eventually an hour a day.   Most people are back to most day-to-day activities in a few weeks.  It takes 4-8 weeks to get back to unrestricted, intense activity. If you can walk 30 minutes without difficulty, it is safe to try more intense activity such as jogging, treadmill, bicycling, low-impact aerobics, swimming, etc. Save the most intensive and strenuous activity for last (Usually 4-8 weeks after surgery) such as sit-ups, heavy lifting, contact sports, etc.  Refrain from any intense heavy lifting or straining until you are off narcotics for pain control.  You will have off days, but things should improve   week-by-week. DO NOT PUSH THROUGH PAIN.  Let pain be your guide: If it hurts to do something, don't do it.  Pain is your body warning you to avoid that activity for another week until the pain goes down. You may drive when you are no longer taking narcotic prescription pain medication, you can comfortably wear a seatbelt, and you can safely make sudden turns/stops to protect yourself without hesitating due to pain. You may have sexual intercourse when it  is comfortable. If it hurts to do something, stop.  MEDICATIONS Take your usually prescribed home medications unless otherwise directed.   Blood thinners:  Usually you can restart any strong blood thinners after the second postoperative day.  It is OK to take aspirin right away.     If you are on strong blood thinners (warfarin/Coumadin, Plavix, Xerelto, Eliquis, Pradaxa, etc), discuss with your surgeon, medicine PCP, and/or cardiologist for instructions on when to restart the blood thinner & if blood monitoring is needed (PT/INR blood check, etc).     PAIN CONTROL Pain after surgery or related to activity is often due to strain/injury to muscle, tendon, nerves and/or incisions.  This pain is usually short-term and will improve in a few months.  To help speed the process of healing and to get back to regular activity more quickly, DO THE FOLLOWING THINGS TOGETHER: Increase activity gradually.  DO NOT PUSH THROUGH PAIN Use Ice and/or Heat Try Gentle Massage and/or Stretching Take over the counter pain medication Take Narcotic prescription pain medication for more severe pain  Good pain control = faster recovery.  It is better to take more medicine to be more active than to stay in bed all day to avoid medications.  Increase activity gradually Avoid heavy lifting at first, then increase to lifting as tolerated over the next 6 weeks. Do not "push through" the pain.  Listen to your body and avoid positions and maneuvers than reproduce the pain.  Wait a few days before trying something more intense Walking an hour a day is encouraged to help your body recover faster and more safely.  Start slowly and stop when getting sore.  If you can walk 30 minutes without stopping or pain, you can try more intense activity (running, jogging, aerobics, cycling, swimming, treadmill, sex, sports, weightlifting, etc.) Remember: If it hurts to do it, then don't do it! Use Ice and/or Heat You will have swelling and  bruising around the incisions.  This will take several weeks to resolve. Ice packs or heating pads (6-8 times a day, 30-60 minutes at a time) will help sooth soreness & bruising. Some people prefer to use ice alone, heat alone, or alternate between ice & heat.  Experiment and see what works best for you.  Consider trying ice for the first few days to help decrease swelling and bruising; then, switch to heat to help relax sore spots and speed recovery. Shower every day.  Short baths are fine.  It feels good!  Keep the incisions and wounds clean with soap & water.   Try Gentle Massage and/or Stretching Massage at the area of pain many times a day Stop if you feel pain - do not overdo it Take over the counter pain medication This helps the muscle and nerve tissues become less irritable and calm down faster Choose ONE of the following over-the-counter anti-inflammatory medications: Acetaminophen 500mg tabs (Tylenol) 1-2 pills with every meal and just before bedtime (avoid if you have liver problems or if you have   acetaminophen in you narcotic prescription) Naproxen 220mg tabs (ex. Aleve, Naprosyn) 1-2 pills twice a day (avoid if you have kidney, stomach, IBD, or bleeding problems) Ibuprofen 200mg tabs (ex. Advil, Motrin) 3-4 pills with every meal and just before bedtime (avoid if you have kidney, stomach, IBD, or bleeding problems) Take with food/snack several times a day as directed for at least 2 weeks to help keep pain / soreness down & more manageable. Take Narcotic prescription pain medication for more severe pain A prescription for strong pain control is often given to you upon discharge (for example: oxycodone/Percocet, hydrocodone/Norco/Vicodin, or tramadol/Ultram) Take your pain medication as prescribed. Be mindful that most narcotic prescriptions contain Tylenol (acetaminophen) as well - avoid taking too much Tylenol. If you are having problems/concerns with the prescription medicine (does  not control pain, nausea, vomiting, rash, itching, etc.), please call us [phone] to see if we need to switch you to a different pain medicine that will work better for you and/or control your side effects better. If you need a refill on your pain medication, you must call the office before 4 pm and on weekdays only.  By federal law, prescriptions for narcotics cannot be called into a pharmacy.  They must be filled out on paper & picked up from our office by the patient or authorized caretaker.  Prescriptions cannot be filled after 4 pm nor on weekends.    WHEN TO CALL US [phone] Severe uncontrolled or worsening pain  Fever over 101 F (38.5 C) Concerns with the incision: Worsening pain, redness, rash/hives, swelling, bleeding, or drainage Reactions / problems with new medications (itching, rash, hives, nausea, etc.) Nausea and/or vomiting Difficulty urinating Difficulty breathing Worsening fatigue, dizziness, lightheadedness, blurred vision Other concerns If you are not getting better after two weeks or are noticing you are getting worse, contact our office [phone] for further advice.  We may need to adjust your medications, re-evaluate you in the office, send you to the emergency room, or see what other things we can do to help. The clinic staff is available to answer your questions during regular business hours (8:30am-5pm).  Please don't hesitate to call and ask to speak to one of our nurses for clinical concerns.    A surgeon from [facility] is always on call at the hospitals 24 hours/day If you have a medical emergency, go to the nearest emergency room or call 911.  FOLLOW UP in our office One the day of your discharge from the hospital (or the next business weekday), please call [facility] to set up or confirm an appointment to see your surgeon in the office for a follow-up appointment.  Usually it is 2-3 weeks after your surgery.   If you  have skin staples at your incision(s), let the office know so we can set up a time in the office for the nurse to remove them (usually around 10 days after surgery). Make sure that you call for appointments the day of discharge (or the next business weekday) from the hospital to ensure a convenient appointment time. IF YOU HAVE DISABILITY OR FAMILY LEAVE FORMS, BRING THEM TO THE OFFICE FOR PROCESSING.  DO NOT GIVE THEM TO YOUR DOCTOR.  [facility] [address], [address], [place], [place]  [address] ? [phone] - Main [phone] - Toll Free,  [phone] - Fax [email or web address]    GETTING TO GOOD BOWEL HEALTH. It is expected for your digestive tract to   need a few months to get back to normal.  It is common for your bowel movements and stools to be irregular.  You will have occasional bloating and cramping that should eventually fade away.  Until you are eating solid food normally, off all pain medications, and back to regular activities; your bowels will not be normal.   Avoiding constipation The goal: ONE SOFT BOWEL MOVEMENT A DAY!    Drink plenty of fluids.  Choose water first. TAKE A FIBER SUPPLEMENT EVERY DAY THE REST OF YOUR LIFE During your first week back home, gradually add back a fiber supplement every day Experiment which form you can tolerate.   There are many forms such as powders, tablets, wafers, gummies, etc Psyllium bran (Metamucil), methylcellulose (Citrucel), Miralax or Glycolax, Benefiber, Flax Seed.  Adjust the dose week-by-week (1/2 dose/day to 6 doses a day) until you are moving your bowels 1-2 times a day.  Cut back the dose or try a different fiber product if it is giving you problems such as diarrhea or bloating. Sometimes a laxative is needed to help jump-start bowels if constipated until the fiber supplement can help regulate your bowels.  If you are tolerating eating & you are farting, it is okay to try a gentle  laxative such as double dose MiraLax, prune juice, or Milk of Magnesia.  Avoid using laxatives too often. Stool softeners can sometimes help counteract the constipating effects of narcotic pain medicines.  It can also cause diarrhea, so avoid using for too long. If you are still constipated despite taking fiber daily, eating solids, and a few doses of laxatives, call our office. Controlling diarrhea Try drinking liquids and eating bland foods for a few days to avoid stressing your intestines further. Avoid dairy products (especially milk & ice cream) for a short time.  The intestines often can lose the ability to digest lactose when stressed. Avoid foods that cause gassiness or bloating.  Typical foods include beans and other legumes, cabbage, broccoli, and dairy foods.  Avoid greasy, spicy, fast foods.  Every person has some sensitivity to other foods, so listen to your body and avoid those foods that trigger problems for you. Probiotics (such as active yogurt, Align, etc) may help repopulate the intestines and colon with normal bacteria and calm down a sensitive digestive tract Adding a fiber supplement gradually can help thicken stools by absorbing excess fluid and retrain the intestines to act more normally.  Slowly increase the dose over a few weeks.  Too much fiber too soon can backfire and cause cramping & bloating. It is okay to try and slow down diarrhea with a few doses of antidiarrheal medicines.   Bismuth subsalicylate (ex. Kayopectate, Pepto Bismol) for a few doses can help control diarrhea.  Avoid if pregnant.   Loperamide (Imodium) can slow down diarrhea.  Start with one tablet (2mg) first.  Avoid if you are having fevers or severe pain.  ILEOSTOMY PATIENTS WILL HAVE CHRONIC DIARRHEA since their colon is not in use.    Drink plenty of liquids.  You will need to drink even more glasses of water/liquid a day to avoid getting dehydrated. Record output from your ileostomy.  Expect to empty  the bag every 3-4 hours at first.  Most people with a permanent ileostomy empty their bag 4-6 times at the least.   Use antidiarrheal medicine (especially Imodium) several times a day to avoid getting dehydrated.  Start with a dose at bedtime & breakfast.  Adjust up or   down as needed.  Increase antidiarrheal medications as directed to avoid emptying the bag more than 8 times a day (every 3 hours). Work with your wound ostomy nurse to learn care for your ostomy.  See ostomy care instructions. TROUBLESHOOTING IRREGULAR BOWELS 1) Start with a soft & bland diet. No spicy, greasy, or fried foods.  2) Avoid gluten/wheat or dairy products from diet to see if symptoms improve. 3) Miralax 17gm or flax seed mixed in 8oz. water or juice-daily. May use 2-4 times a day as needed. 4) Gas-X, Phazyme, etc. as needed for gas & bloating.  5) Prilosec (omeprazole) over-the-counter as needed 6)  Consider probiotics (Align, Activa, etc) to help calm the bowels down  Call your doctor if you are getting worse or not getting better.  Sometimes further testing (cultures, endoscopy, X-ray studies, CT scans, bloodwork, etc.) may be needed to help diagnose and treat the cause of the diarrhea. [facility] [address], [address], [place], [place]  [address] [phone] - Main.    [phone]  - Toll Free.   [phone] - Fax [email or web address]   ###############################   #######################################################  Ostomy Support Information  You've heard that people get along just fine with only one of their eyes, or one of their lungs, or one of their kidneys. But you also know that you have only one intestine and only one bladder, and that leaves you feeling awfully empty, both physically and emotionally: You think no other people go around without part of their intestine with the ends of their intestines sticking out through their abdominal walls.    YOU ARE NOT ALONE.  There are nearly three quarters of a million people in the [place] who have an ostomy; people who have had surgery to remove all or part of their colons or bladders.   There is even a national association, the [employer] with over 350 local affiliated support groups that are organized by volunteers who provide peer support and counseling. [employer] has a toll free telephone num-ber, 800-826-0826 and an educational, interactive website, www.ostomy.org   An ostomy is an opening in the belly (abdominal wall) made by surgery. Ostomates are people who have had this procedure. The opening (stoma) allows the kidney or bowel to grdischarge waste. An external pouch covers the stoma to collect waste. Pouches are are a simple bag and are odor free. Different companies have disposable or reusable pouches to fit one's lifestyle. An ostomy can either be temporary or permanent.   THERE ARE THREE MAIN TYPES OF OSTOMIES Colostomy. A colostomy is a surgically created opening in the large intestine (colon). Ileostomy. An ileostomy is a surgically created opening in the small intestine. Urostomy. A urostomy is a surgically created opening to divert urine away from the bladder.  OSTOMY Care  The following guidelines will make care of your colostomy easier. Keep this information close by for quick reference.  Helpful DIET hints Eat a well-balanced diet including vegetables and fresh fruits. Eat on a regular schedule.  Drink at least 6 to 8 glasses of fluids daily. Eat slowly in a relaxed atmosphere. Chew your food thoroughly. Avoid chewing gum, smoking, and drinking from a straw. This will help decrease the amount of air you swallow, which may help reduce gas. Eating yogurt or drinking buttermilk may help reduce gas.  To control gas at night, do not eat after 8 p.m. This will give your bowel time to quiet down before you go   to bed.  If gas is a problem, you can purchase  Beano. Sprinkle Beano on the first bite of food before eating to reduce gas. It has no flavor and should not change the taste of your food. You can buy Beano over the counter at your local drugstore.  Foods like fish, onions, garlic, broccoli, asparagus, and cabbage produce odor. Although your pouch is odor-proof, if you eat these foods you may notice a stronger odor when emptying your pouch. If this is a concern, you may want to limit these foods in your diet.  If you have an ileostomy, you will have chronic diarrhea & need to drink more liquids to avoid getting dehydrated.  Consider antidiarrheal medicine like imodium (loperamide) or Lomotil to help slow down bowel movements / diarrhea into your ileostomy bag.  GETTING TO GOOD BOWEL HEALTH WITH AN ILEOSTOMY    With the colon bypassed & not in use, you will have small bowel diarrhea.   It is important to thicken & slow your bowel movements down.   The goal: 4-6 small BOWEL MOVEMENTS A DAY It is important to drink plenty of liquids to avoid getting dehydrated  CONTROLLING ILEOSTOMY DIARRHEA  TAKE A FIBER SUPPLEMENT (FiberCon or Benefiner soluble fiber) twice a day - to thicken stools by absorbing excess fluid and retrain the intestines to act more normally.  Slowly increase the dose over a few weeks.  Too much fiber too soon can backfire and cause cramping & bloating.  TAKE AN IRON SUPPLEMENT twice a day to naturally constipate your bowels.  Usually ferrous sulfate 325mg twice a day)  TAKE ANTI-DIARRHEAL MEDICINES: Loperamide (Imodium) can slow down diarrhea.  Start with two tablets (= 4mg) first and then try one tablet every 6 hours.  Can go up to 2 pills four times day (8 pills of 2mg max) Avoid if you are having fevers or severe pain.  If you are not better or start feeling worse, stop all medicines and call your doctor for advice LoMotil (Diphenoxylate / Atropine) is another medicine that can constipate & slow down bowel moevements Pepto  Bismol (bismuth) can gently thicken bowels as well  If diarrhea is worse,: drink plenty of liquids and try simpler foods for a few days to avoid stressing your intestines further. Avoid dairy products (especially milk & ice cream) for a short time.  The intestines often can lose the ability to digest lactose when stressed. Avoid foods that cause gassiness or bloating.  Typical foods include beans and other legumes, cabbage, broccoli, and dairy foods.  Every person has some sensitivity to other foods, so listen to our body and avoid those foods that trigger problems for you.Call your doctor if you are getting worse or not better.  Sometimes further testing (cultures, endoscopy, X-ray studies, bloodwork, etc) may be needed to help diagnose and treat the cause of the diarrhea. Take extra anti-diarrheal medicines (maximum is 8 pills of 2mg loperamide a day)   Tips for POUCHING an OSTOMY   Changing Your Pouch The best time to change your pouch is in the morning, before eating or drinking anything. Your stoma can function at any time, but it will function more after eating or drinking.   Applying the pouching system  Place all your equipment close at hand before removing your pouch.  Wash your hands.  Stand or sit in front of a mirror. Use the position that works best for you. Remember that you must keep the skin around the stoma   wrinkle-free for a good seal.  Gently remove the used pouch (1-piece system) or the pouch and old wafer (2-piece system). Empty the pouch into the toilet. Save the closure clip to use again.  Wash the stoma itself and the skin around the stoma. Your stoma may bleed a little when being washed. This is normal. Rinse and pat dry. You may use a wash cloth or soft paper towels (like Bounty), mild soap (like Dial, Safeguard, or Ivory), and water. Avoid soaps that contain perfumes or lotions.  For a new pouch (1-piece system) or a new wafer (2-piece system), measure your  stoma using the stoma guide in each box of supplies.  Trace the shape of your stoma onto the back of the new pouch or the back of the new wafer. Cut out the opening. Remove the paper backing and set it aside.  Optional: Apply a skin barrier powder to surrounding skin if it is irritated (bare or weeping), and dust off the excess. Optional: Apply a skin-prep wipe (such as Skin Prep or All-Kare) to the skin around the stoma, and let it dry. Do not apply this solution if the skin is irritated (red, tender, or broken) or if you have shaved around the stoma. Optional: Apply a skin barrier paste (such as Stomahesive, Coloplast, or Premium) around the opening cut in the back of the pouch or wafer. Allow it to dry for 30 to 60 seconds.  Hold the pouch (1-piece system) or wafer (2-piece system) with the sticky side toward your body. Make sure the skin around the stoma is wrinkle-free. Center the opening on the stoma, then press firmly to your abdomen (Fig. 4). Look in the mirror to check if you are placing the pouch, or wafer, in the right position. For a 2-piece system, snap the pouch onto the wafer. Make sure it snaps into place securely.  Place your hand over the stoma and the pouch or wafer for about 30 seconds. The heat from your hand can help the pouch or wafer stick to your skin.  Add deodorant (such as Super Banish or Nullo) to your pouch. Other options include food extracts such as vanilla oil and peppermint extract. Add about 10 drops of the deodorant to the pouch. Then apply the closure clamp. Note: Do not use toxic  chemicals or commercial cleaning agents in your pouch. These substances may harm the stoma.  Optional: For extra seal, apply tape to all 4 sides around the pouch or wafer, as if you were framing a picture. You may use any brand of medical adhesive tape. Change your pouch every 5 to 7 days. Change it immediately if a leak occurs.  Wash your hands afterwards.  If you are wearing a  2-piece system, you may use 2 new pouches per week and alternate them. Rinse the pouch with mild soap and warm water and hang it to dry for the next day. Apply the fresh pouch. Alternate the 2 pouches like this for a week. After a week, change the wafer and begin with 2 new pouches. Place the old pouches in a plastic bag, and put them in the trash.   LIVING WITH AN OSTOMY  Emptying Your Pouch Empty your pouch when it is one-third full (of urine, stool, and/or gas). If you wait until your pouch is fuller than this, it will be more difficult to empty and more noticeable. When you empty your pouch, either put toilet paper in the toilet bowl first, or flush the   toilet while you empty the pouch. This will reduce splashing. You can empty the pouch between your legs or to one side while sitting, or while standing or stooping. If you have a 2-piece system, you can snap off the pouch to empty it. Remember that your stoma may function during this time. If you wish to rinse your pouch after you empty it, a turkey baster can be helpful. When using a baster, squirt water up into the pouch through the opening at the bottom. With a 2-piece system, you can snap off the pouch to rinse it. After rinsing  your pouch, empty it into the toilet. When rinsing your pouch at home, put a few granules of Dreft soap in the rinse water. This helps lubricate and freshen your pouch. The inside of your pouch can be sprayed with non-stick cooking oil (Pam spray). This may help reduce stool sticking to the inside of the pouch.  Bathing You may shower or bathe with your pouch on or off. Remember that your stoma may function during this time.  The materials you use to wash your stoma and the skin around it should be clean, but they do not need to be sterile.  Wearing Your Pouch During hot weather, or if you perspire a lot in general, wear a cover over your pouch. This may prevent a rash on your skin under the pouch. Pouch covers are  sold at ostomy supply stores. Wear the pouch inside your underwear for better support. Watch your weight. Any gain or loss of 10 to 15 pounds or more can change the way your pouch fits.  Going Away From Home A collapsible cup (like those that come in travel kits) or a soft plastic squirt bottle with a pull-up top (like a travel bottle for shampoo) can be used for rinsing your pouch when you are away from home. Tilt the opening of the pouch at an upward angle when using a cup to rinse.  Carry wet wipes or extra tissues to use in public bathrooms.  Carry an extra pouching system with you at all times.  Never keep ostomy supplies in the glove compartment of your car. Extreme heat or cold can damage the skin barriers and adhesive wafers on the pouch.  When you travel, carry your ostomy supplies with you at all times. Keep them within easy reach. Do not pack ostomy supplies in baggage that will be checked or otherwise separated from you, because your baggage might be lost. If you're traveling out of the country, it is helpful to have a letter stating that you are carrying ostomy supplies as a medical necessity.  If you need ostomy supplies while traveling, look in the yellow pages of the telephone book under "Surgical Supplies." Or call the local ostomy organization to find out where supplies are available.  Do not let your ostomy supplies get low. Always order new pouches before you use the last one.  Reducing Odor Limit foods such as broccoli, cabbage, onions, fish, and garlic in your diet to help reduce odor. Each time you empty your pouch, carefully clean the opening of the pouch, both inside and outside, with toilet paper. Rinse your pouch 1 or 2 times daily after you empty it (see directions for emptying your pouch and going away from home). Add deodorant (such as Super Banish or Nullo) to your pouch. Use air deodorizers in your bathroom. Do not add aspirin to your pouch. Even though  aspirin can help prevent odor, it   could cause ulcers on your stoma.  When to call the doctor Call the doctor if you have any of the following symptoms: Purple, black, or white stoma Severe cramps lasting more than 6 hours Severe watery discharge from the stoma lasting more than 6 hours No output from the colostomy for 3 days Excessive bleeding from your stoma Swelling of your stoma to more than 1/2-inch larger than usual Pulling inward of your stoma below skin level Severe skin irritation or deep ulcers Bulging or other changes in your abdomen  When to call your ostomy nurse Call your ostomy/enterostomal therapy (WOCN) nurse if any of the following occurs: Frequent leaking of your pouching system Change in size or appearance of your stoma, causing discomfort or problems with your pouch Skin rash or rawness Weight gain or loss that causes problems with your pouch     FREQUENTLY ASKED QUESTIONS   Why haven't you met any of these folks who have an ostomy?  Well, maybe you have! You just did not recognize them because an ostomy doesn't show. It can be kept secret if you wish. Why, maybe some of your best friends, office associates or neighbors have an ostomy ... you never can tell. People facing ostomy surgery have many quality-of-life questions like: Will you bulge? Smell? Make noises? Will you feel waste leaving your body? Will you be a captive of the toilet? Will you starve? Be a social outcast? Get/stay married? Have babies? Easily bathe, go swimming, bend over?  OK, let's look at what you can expect:   Will you bulge?  Remember, without part of the intestine or bladder, and its contents, you should have a flatter tummy than before. You can expect to wear, with little exception, what you wore before surgery ... and this in-cludes tight clothing and bathing suits.   Will you smell?  Today, thanks to modern odor proof pouching systems, you can walk into an ostomy support group  meeting and not smell anything that is foul or offensive. And, for those with an ileostomy or colostomy who are concerned about odor when emptying their pouch, there are in-pouch deodorants that can be used to eliminate any waste odors that may exist.   Will you make noises?  Everyone produces gas, especially if they are an air-swallower. But intestinal sounds that occur from time to time are no differ-ent than a gurgling tummy, and quite often your clothing will muffle any sounds.   Will you feel the waste discharges?  For those with a colostomy or ileostomy there might be a slight pressure when waste leaves your body, but understand that the intestines have no nerve endings, so there will be no unpleasant sensations. Those with a urostomy will probably be unaware of any kidney drainage.   Will you be a captive of the toilet?  Immediately post-op you will spend more time in the bathroom than you will after your body recovers from surgery. Every person is different, but on average those with an ileostomy or urostomy may empty their pouches 4 to 6 times a day; a little  less if you have a colostomy. The average wear time between pouch system changes is 3 to 5 days and the changing process should take less than 30 minutes.   Will I need to be on a special diet? Most people return to their normal diet when they have recovered from surgery. Be sure to chew your food well, eat a well-balanced diet and drink plenty of fluids. If   you experience problems with a certain food, wait a couple of weeks and try it again.  Will there be odor and noises? Pouching systems are designed to be odor-proof or odor-resistant. There are deodorants that can be used in the pouch. Medications are also available to help reduce odor. Limit gas-producing foods and carbonated beverages. You will experience less gas and fewer noises as you heal from surgery.  How much time will it take to care for my ostomy? At first, you may  spend a lot of time learning about your ostomy and how to take care of it. As you become more comfortable and skilled at changing the pouching system, it will take very little time to care for it.   Will I be able to return to work? People with ostomies can perform most jobs. As soon as you have healed from surgery, you should be able to return to work. Heavy lifting (more than 10 pounds) may be discouraged.   What about intimacy? Sexual relationships and intimacy are important and fulfilling aspects of your life. They should continue after ostomy surgery. Intimacy-related concerns should be discussed openly between you and your partner.   Can I wear regular clothing? You do not need to wear special clothing. Ostomy pouches are fairly flat and barely noticeable. Elastic undergarments will not hurt the stoma or prevent the ostomy from functioning.   Can I participate in sports? An ostomy should not limit your involvement in sports. Many people with ostomies are runners, skiers, swimmers or participate in other active lifestyles. Talk with your caregiver first before doing heavy physical activity.  Will you starve?  Not if you follow doctor's orders at each stage of your post-op adjustment. There is no such thing as an "ostomy diet". Some people with an ostomy will be able to eat and tolerate anything; others may find diffi-culty with some foods. Each person is an individual and must determine, by trial, what is best for them. A good practice for all is to drink plenty of water.   Will you be a social outcast?  Have you met anyone who has an ostomy and is a social outcast? Why should you be the first? Only your attitude and self image will effect how you are treated. No confi-dent person is an outcast.    PROFESSIONAL HELP   Resources are available if you need help or have questions about your ostomy.   Specially trained nurses called Wound, Ostomy Continence Nurses (WOCN) are available for  consultation in most major medical centers.  Consider getting an ostomy consult at an outpatient ostomy clinic.   [place] has an Ostomy Clinic run by an WOCN ostomy nurse at the [facility].  [phone]. [facility] can help set up an appointment   The [employer] Association (UOA) is a group made up of many local chapters throughout the [place]. These local groups hold meetings and provide support to prospective and existing ostomates. They sponsor educational events and have qualified visitors to make personal or telephone visits. Contact the UOA for the chapter nearest you and for other educational publications.  More detailed information can be found in Colostomy Guide, a publication of the [employer] Ostomy Association (UOA). Contact UOA at 1-800-826-0826 or visit their web site at www.uoaa.org. The website contains links to other sites, suppliers and resources.  [facility] Secure Start Services: Start at the website to enlist for support.  Your Wound Ostomy (WOCN) nurse may have started this   process. [email or web address]://[email or web address].[email or web address].com/en/securestart Secure Start services are designed to support people as they live their lives with an ostomy or neurogenic bladder. Enrolling is easy and at no cost to the patient. We realize that each person's needs and life journey are different. Through Secure Start services, we want to help people live their life, their way.  #######################################################  

## 2021-07-24 NOTE — Discharge Summary (Signed)
Physician Discharge Summary  ?Patient ID: ?[name] ?MRN: [id number] ?DOB/AGE: March 30, 1946 76 y.o. ? ?Admit date: 07/20/2021 ?Discharge date: 07/24/2021 ? ?Admission Diagnoses: Colon cancer ? ?Discharge Diagnoses:  ?Principal Problem: ?  Colon cancer ([place]) ? ? ?Discharged Condition: stable ? ?Hospital Course: Patient was admitted to the med surg floor after surgery.  Diet was advanced as tolerated.  Patient began to have bowel function on postop day 2.  She developed an increased Cr with normal UOP.  This was treated with IV fluids.  Cr peaked at 1.4 and then returned to baseline.  She also had a low velocity fall getting off the toilet.  She felt this was due to her knee pain.  PT eval completed and recommended home health PT.  Pt does not feel that this will be helpful to her.  By postop day 4, she was tolerating a solid diet and pain was controlled with oral medications.  She was urinating without difficulty and ambulating without assistance.  Patient was felt to be in stable condition for discharge to home.  ? ?Consults: None ? ?Significant Diagnostic Studies: labs: BMET, CBC ? ?Treatments: IV hydration, analgesia: acetaminophen and oxycodone, and surgery: Robotic R colectomy ? ?Discharge Exam: ?Blood pressure (!) 131/42, pulse (!) 57, temperature 97.8 ?F (36.6 ?C), temperature source Oral, resp. rate 18, height '4\' 11"'$  (1.499 m), weight 97.9 kg, SpO2 98 %. ?General appearance: alert and cooperative ?GI: normal findings: soft, non-tender ?Incision/Wound: clean, dry, intact ? ?Disposition: home  ? ? ?Allergies as of 07/24/2021   ? ?   Reactions  ? Chocolate Itching  ? Itching mouth  ? Beef-derived Products Itching  ? Cat Hair Extract Itching  ? Erythromycin   ? Other reaction(s): Unknown  ? Fish Allergy Itching  ? Phenergan [promethazine] Nausea Only  ? Pt and family states her nausea worsens with phenergan rather than improving to the point she states she cant have it.   ? Pork-derived Products Itching  ?  Shellfish Allergy Itching  ? Soybean Extract Allergy Skin Test Itching  ? Strawberry Extract Itching  ? Naproxen Sodium Other (See Comments)  ? Hurt all over  ? Penicillins Rash  ? Has patient had a PCN reaction causing immediate rash, facial/tongue/throat swelling, SOB or lightheadedness with hypotension: Yes ?Has patient had a PCN reaction causing severe rash involving mucus membranes or skin necrosis: Yes ?Has patient had a PCN reaction that required hospitalization: No ?Has patient had a PCN reaction occurring within the last 10 years: No ?If all of the above answers are "NO", then may proceed with Cephalosporin use.  ? ?  ? ?  ?Medication List  ?  ? ?TAKE these medications   ? ?dicyclomine 10 MG capsule ?Commonly known as: BENTYL ?Take 1 capsule (10 mg total) by mouth 2 (two) times daily before a meal. ?  ?flecainide 100 MG tablet ?Commonly known as: TAMBOCOR ?Take 1 tablet (100 mg total) by mouth 2 (two) times daily. ?  ?omeprazole 10 MG capsule ?Commonly known as: PRILOSEC ?Take 10 mg by mouth daily as needed (acid reflux). ?  ?oxyCODONE 5 MG immediate release tablet ?Commonly known as: Oxy IR/ROXICODONE ?Take 1 tablet (5 mg total) by mouth every 6 (six) hours as needed for moderate pain. ?  ?ursodiol 300 MG capsule ?Commonly known as: ACTIGALL ?Take 2 capsules (600 mg total) by mouth once a daily. ?  ?warfarin 5 MG tablet ?Commonly known as: COUMADIN ?Take as directed. If you are unsure how to take  this medication, talk to your nurse or doctor. ?Original instructions: TAKE 1 TO 2 TABLETS BY MOUTH ONCE DAILY OR  AS  DIRECTED  BY  COUMADIN  CLINIC ?What changed: See the new instructions. ?  ? ?  ? ? Follow-up Information   ? ? Leighton Ruff, MD. Schedule an appointment as soon as possible for a visit in 2 week(s).   ?Specialties: General Surgery, Colon and Rectal Surgery ?Contact information: ?[address] ?[address] ?[place] [id number] ?[phone] ? ? ?  ?  ? ?  ?  ? ?  ? ? ?Signed: ?Rosario Adie ?07/24/2021, 7:58 AM ? ? ?

## 2021-07-24 NOTE — Progress Notes (Signed)
Patient was given discharge instructions, and all questions were answered.  Patient was stable for discharge and was taken to the main exit by wheelchair. 

## 2021-07-25 ENCOUNTER — Other Ambulatory Visit: Payer: Self-pay | Admitting: Cardiology

## 2021-07-27 LAB — SURGICAL PATHOLOGY

## 2021-07-30 ENCOUNTER — Ambulatory Visit (INDEPENDENT_AMBULATORY_CARE_PROVIDER_SITE_OTHER): Payer: Medicare PPO

## 2021-07-30 DIAGNOSIS — Z5181 Encounter for therapeutic drug level monitoring: Secondary | ICD-10-CM | POA: Diagnosis not present

## 2021-07-30 DIAGNOSIS — I480 Paroxysmal atrial fibrillation: Secondary | ICD-10-CM

## 2021-07-30 LAB — POCT INR: INR: 2.3 (ref 2.0–3.0)

## 2021-07-30 NOTE — Patient Instructions (Signed)
Continue 1 tablet Daily, except 1.5 tablets on Wednesday.  Repeat 6 weeks [phone];  ?

## 2021-07-31 DIAGNOSIS — I4891 Unspecified atrial fibrillation: Secondary | ICD-10-CM | POA: Diagnosis not present

## 2021-07-31 DIAGNOSIS — C189 Malignant neoplasm of colon, unspecified: Secondary | ICD-10-CM | POA: Diagnosis not present

## 2021-07-31 DIAGNOSIS — Z09 Encounter for follow-up examination after completed treatment for conditions other than malignant neoplasm: Secondary | ICD-10-CM | POA: Diagnosis not present

## 2021-07-31 DIAGNOSIS — I10 Essential (primary) hypertension: Secondary | ICD-10-CM | POA: Diagnosis not present

## 2021-07-31 DIAGNOSIS — D6869 Other thrombophilia: Secondary | ICD-10-CM | POA: Diagnosis not present

## 2021-08-01 ENCOUNTER — Other Ambulatory Visit (HOSPITAL_COMMUNITY): Payer: Self-pay

## 2021-08-09 ENCOUNTER — Ambulatory Visit: Payer: Medicare PPO | Admitting: Physician Assistant

## 2021-08-29 ENCOUNTER — Other Ambulatory Visit: Payer: Self-pay | Admitting: Gastroenterology

## 2021-09-10 ENCOUNTER — Ambulatory Visit (INDEPENDENT_AMBULATORY_CARE_PROVIDER_SITE_OTHER): Payer: Medicare PPO

## 2021-09-10 DIAGNOSIS — Z5181 Encounter for therapeutic drug level monitoring: Secondary | ICD-10-CM | POA: Diagnosis not present

## 2021-09-10 DIAGNOSIS — I480 Paroxysmal atrial fibrillation: Secondary | ICD-10-CM | POA: Diagnosis not present

## 2021-09-10 LAB — POCT INR: INR: 1.6 — AB (ref 2.0–3.0)

## 2021-09-10 NOTE — Patient Instructions (Signed)
TAKE 2 TABLETS TODAY ONLY and then Continue 1 tablet Daily, except 1.5 tablets on Wednesday.  Repeat 3 weeks [phone];

## 2021-09-21 ENCOUNTER — Other Ambulatory Visit: Payer: Self-pay | Admitting: Gastroenterology

## 2021-10-01 ENCOUNTER — Ambulatory Visit (INDEPENDENT_AMBULATORY_CARE_PROVIDER_SITE_OTHER): Payer: Medicare PPO

## 2021-10-01 DIAGNOSIS — I480 Paroxysmal atrial fibrillation: Secondary | ICD-10-CM | POA: Diagnosis not present

## 2021-10-01 DIAGNOSIS — Z5181 Encounter for therapeutic drug level monitoring: Secondary | ICD-10-CM

## 2021-10-01 LAB — POCT INR: INR: 1.7 — AB (ref 2.0–3.0)

## 2021-10-10 DIAGNOSIS — I10 Essential (primary) hypertension: Secondary | ICD-10-CM | POA: Diagnosis not present

## 2021-10-10 DIAGNOSIS — C189 Malignant neoplasm of colon, unspecified: Secondary | ICD-10-CM | POA: Diagnosis not present

## 2021-10-10 DIAGNOSIS — I4891 Unspecified atrial fibrillation: Secondary | ICD-10-CM | POA: Diagnosis not present

## 2021-10-10 DIAGNOSIS — D6869 Other thrombophilia: Secondary | ICD-10-CM | POA: Diagnosis not present

## 2021-10-10 DIAGNOSIS — E039 Hypothyroidism, unspecified: Secondary | ICD-10-CM | POA: Diagnosis not present

## 2021-10-10 DIAGNOSIS — H6121 Impacted cerumen, right ear: Secondary | ICD-10-CM | POA: Diagnosis not present

## 2021-10-15 ENCOUNTER — Other Ambulatory Visit: Payer: Self-pay | Admitting: Cardiology

## 2021-10-19 ENCOUNTER — Other Ambulatory Visit: Payer: Self-pay | Admitting: Cardiology

## 2021-10-19 ENCOUNTER — Other Ambulatory Visit: Payer: Self-pay | Admitting: Gastroenterology

## 2021-10-19 NOTE — Telephone Encounter (Signed)
Prescription refill request received for warfarin Lov:  05/14/2021 Next INR check: 7/17  Warfarin tablet strength: '5mg'$    Refill sent.

## 2021-10-25 ENCOUNTER — Ambulatory Visit (INDEPENDENT_AMBULATORY_CARE_PROVIDER_SITE_OTHER): Payer: Medicare PPO

## 2021-10-25 ENCOUNTER — Other Ambulatory Visit: Payer: Self-pay

## 2021-10-25 DIAGNOSIS — I480 Paroxysmal atrial fibrillation: Secondary | ICD-10-CM

## 2021-10-25 LAB — POCT INR: INR: 1.7 — AB (ref 2.0–3.0)

## 2021-10-25 MED ORDER — WARFARIN SODIUM 5 MG PO TABS: ORAL_TABLET | ORAL | 0 refills | Status: DC

## 2021-10-25 NOTE — Patient Instructions (Signed)
Description   Take 1.5 tablets today and then START taking 1 tablet daily, except 2 tablets on Mondays and Wednesdays.  Repeat 2 weeks [phone];

## 2021-10-25 NOTE — Telephone Encounter (Signed)
Prescription refill request received for warfarin Lov: 05/14/21 ([facility])  Next INR check: 11/08/21 Warfarin tablet strength: '5mg'$   Appropriate dose and refill sent to requested pharmacy.

## 2021-10-31 DIAGNOSIS — Z6839 Body mass index (BMI) 39.0-39.9, adult: Secondary | ICD-10-CM | POA: Diagnosis not present

## 2021-10-31 DIAGNOSIS — Z7901 Long term (current) use of anticoagulants: Secondary | ICD-10-CM | POA: Diagnosis not present

## 2021-10-31 DIAGNOSIS — Z803 Family history of malignant neoplasm of breast: Secondary | ICD-10-CM | POA: Diagnosis not present

## 2021-10-31 DIAGNOSIS — K219 Gastro-esophageal reflux disease without esophagitis: Secondary | ICD-10-CM | POA: Diagnosis not present

## 2021-10-31 DIAGNOSIS — R030 Elevated blood-pressure reading, without diagnosis of hypertension: Secondary | ICD-10-CM | POA: Diagnosis not present

## 2021-10-31 DIAGNOSIS — I951 Orthostatic hypotension: Secondary | ICD-10-CM | POA: Diagnosis not present

## 2021-10-31 DIAGNOSIS — R32 Unspecified urinary incontinence: Secondary | ICD-10-CM | POA: Diagnosis not present

## 2021-10-31 DIAGNOSIS — I4891 Unspecified atrial fibrillation: Secondary | ICD-10-CM | POA: Diagnosis not present

## 2021-11-08 ENCOUNTER — Ambulatory Visit (INDEPENDENT_AMBULATORY_CARE_PROVIDER_SITE_OTHER): Payer: Medicare PPO

## 2021-11-08 DIAGNOSIS — I480 Paroxysmal atrial fibrillation: Secondary | ICD-10-CM

## 2021-11-08 LAB — POCT INR: INR: 2.2 (ref 2.0–3.0)

## 2021-11-08 NOTE — Patient Instructions (Signed)
Description   Continue taking 1 tablet daily, except 2 tablets on Mondays and Wednesdays.  Repeat 4 weeks [phone];

## 2021-11-13 ENCOUNTER — Other Ambulatory Visit: Payer: Self-pay | Admitting: Gastroenterology

## 2021-12-04 DIAGNOSIS — H5203 Hypermetropia, bilateral: Secondary | ICD-10-CM | POA: Diagnosis not present

## 2021-12-06 ENCOUNTER — Ambulatory Visit: Payer: Medicare PPO | Attending: Cardiology

## 2021-12-06 DIAGNOSIS — I480 Paroxysmal atrial fibrillation: Secondary | ICD-10-CM | POA: Diagnosis not present

## 2021-12-06 LAB — POCT INR: INR: 2 (ref 2.0–3.0)

## 2021-12-06 NOTE — Patient Instructions (Signed)
Description   Take 1.5 tablets today and then continue taking 1 tablet daily, except 2 tablets on Mondays and Wednesdays.  Repeat 5 weeks [phone];

## 2021-12-09 ENCOUNTER — Other Ambulatory Visit: Payer: Self-pay | Admitting: Gastroenterology

## 2022-01-05 DIAGNOSIS — Z23 Encounter for immunization: Secondary | ICD-10-CM | POA: Diagnosis not present

## 2022-01-10 ENCOUNTER — Ambulatory Visit: Payer: Medicare PPO | Attending: Cardiology | Admitting: *Deleted

## 2022-01-10 DIAGNOSIS — I480 Paroxysmal atrial fibrillation: Secondary | ICD-10-CM | POA: Diagnosis not present

## 2022-01-10 DIAGNOSIS — Z5181 Encounter for therapeutic drug level monitoring: Secondary | ICD-10-CM

## 2022-01-10 LAB — POCT INR: INR: 1.8 — AB (ref 2.0–3.0)

## 2022-01-10 MED ORDER — WARFARIN SODIUM 5 MG PO TABS: ORAL_TABLET | ORAL | 0 refills | Status: DC

## 2022-01-10 NOTE — Patient Instructions (Addendum)
Description   Start taking 1 tablet daily, except 2 tablets on Mondays, Wednesdays, and Fridays. Recheck INR 4 weeks. [phone]

## 2022-01-12 ENCOUNTER — Other Ambulatory Visit: Payer: Self-pay | Admitting: Cardiology

## 2022-01-16 DIAGNOSIS — R42 Dizziness and giddiness: Secondary | ICD-10-CM | POA: Insufficient documentation

## 2022-01-16 DIAGNOSIS — R0602 Shortness of breath: Secondary | ICD-10-CM | POA: Insufficient documentation

## 2022-01-16 NOTE — Progress Notes (Signed)
Cardiology Office Note   Date:  01/17/2022   ID:  [name], DOB 1945/12/23, MRN [id number]  PCP:  Kelton Pillar, MD  Cardiologist:   Minus Breeding, MD   Chief Complaint  Patient presents with   Shortness of Breath      History of Present Illness:  [name] is a 76 y.o. female who presents for follow up of atrial fib.   At the last visit her metoprolol was decreased because of bradycardia.  Cozaar was added because of hypertension.  Since we last saw her she did have a treadmill test just to follow-up for flecainide treatment.  There were no inducible arrhythmias or EKG changes.  It was a submaximal test as she did not reach the target heart rate.  There is no evidence of ischemia.   Lexiscan Myoview demonstrated a well-preserved ejection fraction.  There was no evidence of ischemia.    PFTs were ordered but she had to cancel these because something came up.  She continues with multiple complaints.  She has joint pains that limit her.  She gets very short of breath just doing chores around the house.  She has a lot of dizziness.  Walking through a store she might get dizzy.  She does not sleep more than a couple of hours a night.  She is not having any substernal chest pain but she has some left-sided shooting pains for which we did the stress test.  These are not changed.  These are not necessarily reproducible with exercise.  She thinks her heart rate goes fast when she moves quickly but she is not describing resting palpitations and despite the dizziness she has not had any frank syncope.  She is not describing PND or orthopnea.   Past Medical History:  Diagnosis Date   Anemia    Asthma    Borderline hyperlipidemia    Borderline hypertension    Cancer ([place])    Complication of anesthesia    PONV   Dysrhythmia    Hypertension    Hypothyroid    s/p PTU therapy   Hypothyroidism 09/15/2008   Qualifier: Diagnosis of  By: Percival Spanish, MD, Johnell Comings  (paroxysmal atrial fibrillation) ([place])    PONV (postoperative nausea and vomiting)     Past Surgical History:  Procedure Laterality Date   BILIARY DILATION  07/14/2020   Procedure: BILIARY DILATION;  Surgeon: Milus Banister, MD;  Location: [facility] ENDOSCOPY;  Service: Endoscopy;;   CESAREAN SECTION     CHOLECYSTECTOMY     ENDOSCOPIC RETROGRADE CHOLANGIOPANCREATOGRAPHY (ERCP) WITH PROPOFOL N/A 07/14/2020   Procedure: ENDOSCOPIC RETROGRADE CHOLANGIOPANCREATOGRAPHY (ERCP) WITH PROPOFOL;  Surgeon: Milus Banister, MD;  Location: [facility] ENDOSCOPY;  Service: Endoscopy;  Laterality: N/A;   ENDOSCOPIC RETROGRADE CHOLANGIOPANCREATOGRAPHY (ERCP) WITH PROPOFOL N/A 03/15/2021   Procedure: ENDOSCOPIC RETROGRADE CHOLANGIOPANCREATOGRAPHY (ERCP) WITH PROPOFOL;  Surgeon: Gatha Mayer, MD;  Location: [place];  Service: Endoscopy;  Laterality: N/A;   ESOPHAGOGASTRODUODENOSCOPY (EGD) WITH PROPOFOL N/A 07/14/2020   Procedure: ESOPHAGOGASTRODUODENOSCOPY (EGD) WITH PROPOFOL;  Surgeon: Milus Banister, MD;  Location: [facility] ENDOSCOPY;  Service: Endoscopy;  Laterality: N/A;   KNEE SURGERY     right 2014, left 2015, arthroscipic   LEG SURGERY     Left leg d/t  tumor (benign)   REMOVAL OF [name]  07/14/2020   Procedure: REMOVAL OF [name];  Surgeon: Milus Banister, MD;  Location: [facility] ENDOSCOPY;  Service: Endoscopy;;   [name]  07/14/2020  Procedure: SPHINCTEROTOMY;  Surgeon: Milus Banister, MD;  Location: [place];  Service: Endoscopy;;   THYROID SURGERY     UPPER ESOPHAGEAL ENDOSCOPIC ULTRASOUND (EUS) N/A 07/14/2020   Procedure: UPPER ESOPHAGEAL ENDOSCOPIC ULTRASOUND (EUS);  Surgeon: Milus Banister, MD;  Location: [facility] ENDOSCOPY;  Service: Endoscopy;  Laterality: N/A;     Current Outpatient Medications  Medication Sig Dispense Refill   dicyclomine (BENTYL) 10 MG capsule Take 1 capsule (10 mg total) by mouth 2 (two) times daily before a meal. 30 capsule 0   flecainide (TAMBOCOR) 100 MG tablet Take 1 tablet by  mouth twice daily 180 tablet 0   omeprazole (PRILOSEC) 10 MG capsule Take 10 mg by mouth daily as needed (acid reflux).     ursodiol (ACTIGALL) 300 MG capsule Take 2 capsules by mouth once daily 60 capsule 3   warfarin (COUMADIN) 5 MG tablet TAKE 1 to 2 TABLETS BY MOUTH ONCE DAILY OR AS  DIRECTED BY COUMADIN  CLINIC 140 tablet 0   No current facility-administered medications for this visit.    Allergies:   Chocolate, Beef-derived products, Cat hair extract, Erythromycin, Fish allergy, Phenergan [promethazine], Pork-derived products, Shellfish allergy, Soybean extract allergy skin test, Strawberry extract, Naproxen sodium, and Penicillins    ROS:  Please see the history of present illness.   Otherwise, review of systems are positive for none.   All other systems are reviewed and negative.    PHYSICAL EXAM: VS:  BP (!) 148/86   Pulse 66   Ht '4\' 11"'$  (1.499 m)   Wt 201 lb 12.8 oz (91.5 kg)   SpO2 96%   BMI 40.76 kg/m  , BMI Body mass index is 40.76 kg/m. GENERAL:  Well appearing NECK:  No jugular venous distention, waveform within normal limits, carotid upstroke brisk and symmetric, no bruits, no thyromegaly LUNGS:  Clear to auscultation bilaterally CHEST:  Unremarkable HEART:  PMI not displaced or sustained,S1 and S2 within normal limits, no S3, no S4, no clicks, no rubs, no murmurs ABD:  Flat, positive bowel sounds normal in frequency in pitch, no bruits, no rebound, no guarding, no midline pulsatile mass, no hepatomegaly, no splenomegaly EXT:  2 plus pulses throughout, no edema, no cyanosis no clubbing  EKG:  EKG is  ordered today. Sinus rhythm, rate 66, axis within normal limits, intervals within normal limits, poor anterior R wave progression, no acute ST-T wave changes.  Recent Labs: 03/15/2021: Magnesium 1.9 05/14/2021: BNP 280.0 06/12/2021: ALT 11 07/24/2021: BUN 23; Creatinine, Ser 1.06; Hemoglobin 9.6; Platelets 158; Potassium 3.9; Sodium 133    Lipid Panel    Component  Value Date/Time   CHOL  05/27/2008 0700    149        ATP III CLASSIFICATION:  <200     mg/dL   Desirable  200-239  mg/dL   Borderline High  >=240    mg/dL   High          TRIG 54 05/27/2008 0700   HDL 51 05/27/2008 0700   CHOLHDL 2.9 05/27/2008 0700   VLDL 11 05/27/2008 0700   LDLCALC  05/27/2008 0700    87        Total Cholesterol/HDL:CHD Risk Coronary Heart Disease Risk Table                     Men   Women  1/2 Average Risk   3.4   3.3  Average Risk       5.0  4.4  2 X Average Risk   9.6   7.1  3 X Average Risk  23.4   11.0        Use the calculated Patient Ratio above and the CHD Risk Table to determine the patient's CHD Risk.        ATP III CLASSIFICATION (LDL):  <100     mg/dL   Optimal  100-129  mg/dL   Near or Above                    Optimal  130-159  mg/dL   Borderline  160-189  mg/dL   High  >190     mg/dL   Very High      Wt Readings from Last 3 Encounters:  01/17/22 201 lb 12.8 oz (91.5 kg)  07/23/21 215 lb 13.3 oz (97.9 kg)  07/09/21 205 lb (93 kg)      Other studies Reviewed: Additional studies/ records that were reviewed today include: Stress test Review of the above records demonstrates:  Please see elsewhere in the note.     ASSESSMENT AND PLAN:  Paroxysmal atrial fibrillation:    Ms. RAYYA YAGI has a CHA2DS2 - VASc score of 4.   She tolerates anticoagulation.  There have been no symptomatic paroxysms that I can record.  She tolerates anticoagulation.  Of note she is on unopposed flecainide without AV nodal blocking agents because of significant orthostasis in the past.  Essential hypertension:  Her blood pressure is mildly elevated supine which she has had significant orthostatic hypotension.  I think this is the cause of her dizziness.  I am not going to change her meds for management of this.   Dizziness:   I think this is probably related to her orthostasis.  I had her wear a monitor.  There were no sustained arrhythmias.  I have  suggested compression garments and she needs to give this a trial before we would consider other etiologies if this fails.   SOB: She was to have pulmonary function testing and I will reschedule these.  I do not think she is having ischemia given the work-up above.   Insomnia: Her STOP-BANG score is 4.  She has snoring.  She has daytime somnolence.  She has insomnia.  Her body habitus is consistent.  She needs sleep apnea study.   Current medicines are reviewed at length with the patient today.  The patient does not have concerns regarding medicines.  The following changes have been made: None  Labs/ tests ordered today include:    Orders Placed This Encounter  Procedures   EKG 12-Lead   Split night study     Disposition:   FU with APP in about 4 months.    Signed, Minus Breeding, MD  01/17/2022 10:27 AM    [facility]

## 2022-01-17 ENCOUNTER — Ambulatory Visit: Payer: Medicare PPO | Attending: Cardiology | Admitting: Cardiology

## 2022-01-17 ENCOUNTER — Encounter: Payer: Self-pay | Admitting: Cardiology

## 2022-01-17 VITALS — BP 148/86 | HR 66 | Ht 59.0 in | Wt 201.8 lb

## 2022-01-17 DIAGNOSIS — I10 Essential (primary) hypertension: Secondary | ICD-10-CM

## 2022-01-17 DIAGNOSIS — G4730 Sleep apnea, unspecified: Secondary | ICD-10-CM

## 2022-01-17 DIAGNOSIS — R0602 Shortness of breath: Secondary | ICD-10-CM | POA: Diagnosis not present

## 2022-01-17 DIAGNOSIS — I480 Paroxysmal atrial fibrillation: Secondary | ICD-10-CM | POA: Diagnosis not present

## 2022-01-17 DIAGNOSIS — R42 Dizziness and giddiness: Secondary | ICD-10-CM

## 2022-01-17 DIAGNOSIS — R0683 Snoring: Secondary | ICD-10-CM | POA: Diagnosis not present

## 2022-01-17 NOTE — Patient Instructions (Signed)
Medication Instructions:  Your physician recommends that you continue on your current medications as directed. Please refer to the Current Medication list given to you today.  *If you need a refill on your cardiac medications before your next appointment, please call your pharmacy*   Lab Work: NONE ordered at this time of appointment   If you have labs (blood work) drawn today and your tests are completely normal, you will receive your results only by: [place] (if you have MyChart) OR A paper copy in the mail If you have any lab test that is abnormal or we need to change your treatment, we will call you to review the results.   Testing/Procedures: Your physician has recommended that you have a sleep study. This test records several body functions during sleep, including: brain activity, eye movement, oxygen and carbon dioxide blood levels, heart rate and rhythm, breathing rate and rhythm, the flow of air through your mouth and nose, snoring, body muscle movements, and chest and belly movement.   Your physician has recommended that you have a pulmonary function test. Pulmonary Function Tests are a group of tests that measure how well air moves in and out of your lungs.    Follow-Up: At [facility], you and your health needs are our priority.  As part of our continuing mission to provide you with exceptional heart care, we have created designated Provider Care Teams.  These Care Teams include your primary Cardiologist (physician) and Advanced Practice Providers (APPs -  Physician Assistants and Nurse Practitioners) who all work together to provide you with the care you need, when you need it.  We recommend signing up for the patient portal called "MyChart".  Sign up information is provided on this After Visit Summary.  MyChart is used to connect with patients for Virtual Visits (Telemedicine).  Patients are able to view lab/test results, encounter notes, upcoming appointments,  etc.  Non-urgent messages can be sent to your provider as well.   To learn more about what you can do with MyChart, go to [email or web address].    Your next appointment:   4 month(s)  The format for your next appointment:   In Person  Provider:   Almyra Deforest, PA-C        Important Information About Sugar

## 2022-01-18 ENCOUNTER — Telehealth: Payer: Medicare PPO | Admitting: Cardiology

## 2022-01-18 NOTE — Telephone Encounter (Signed)
Denise with [facility] states they received a request for a sleep study. She states they are needing additional information in order to process the request. Specifically she would like to know if patient's Opioid is chronic.   Phone#: [phone] Tracking#: [id number]

## 2022-01-18 NOTE — Telephone Encounter (Signed)
Routed to sleep studies clinical team for assistance

## 2022-01-21 IMAGING — RF DG ERCP WO/W SPHINCTEROTOMY
1 series · 4 of 4 positions shown · non-contrast
Comparison: MRCP from 05/25/2020

CLINICAL DATA: 75-year-old female undergoing ERCP.

EXAM:
ERCP
TECHNIQUE: Multiple spot images obtained with the fluoroscopic device and
submitted for interpretation post-procedure.
FLUOROSCOPY TIME:  Number of Acquired Spot Images: 4

[Series 1: unknown protocol · 0.20mm/px · 4 of 4 slices shown]
[im 1/4]
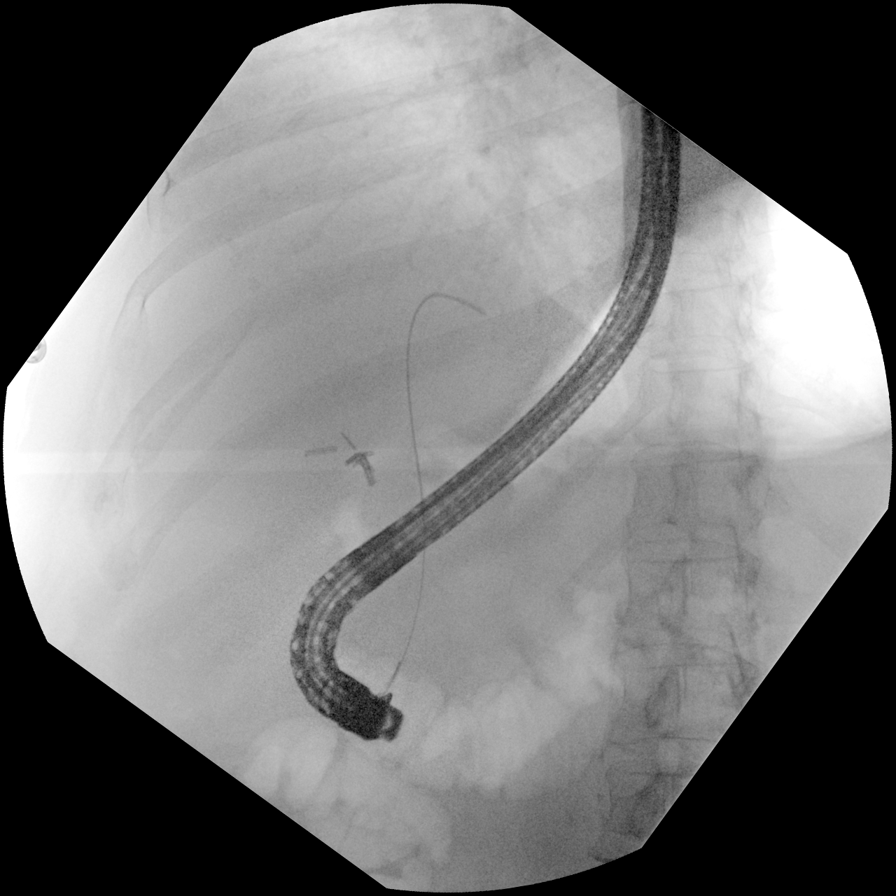
[im 2/4]
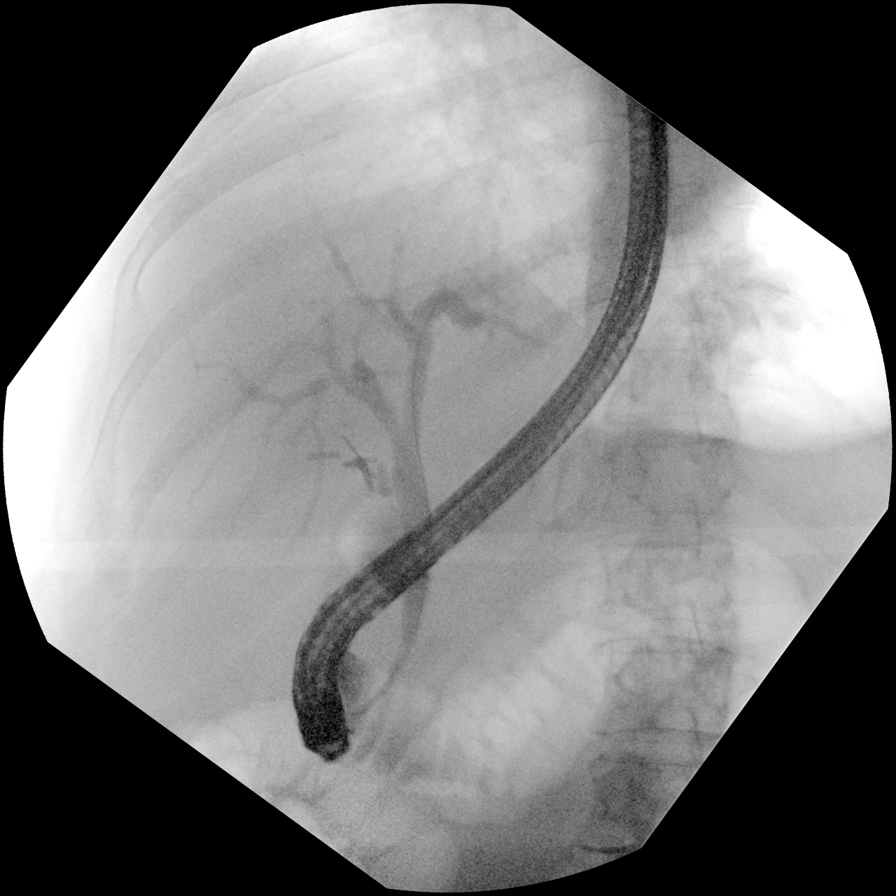
[im 3/4]
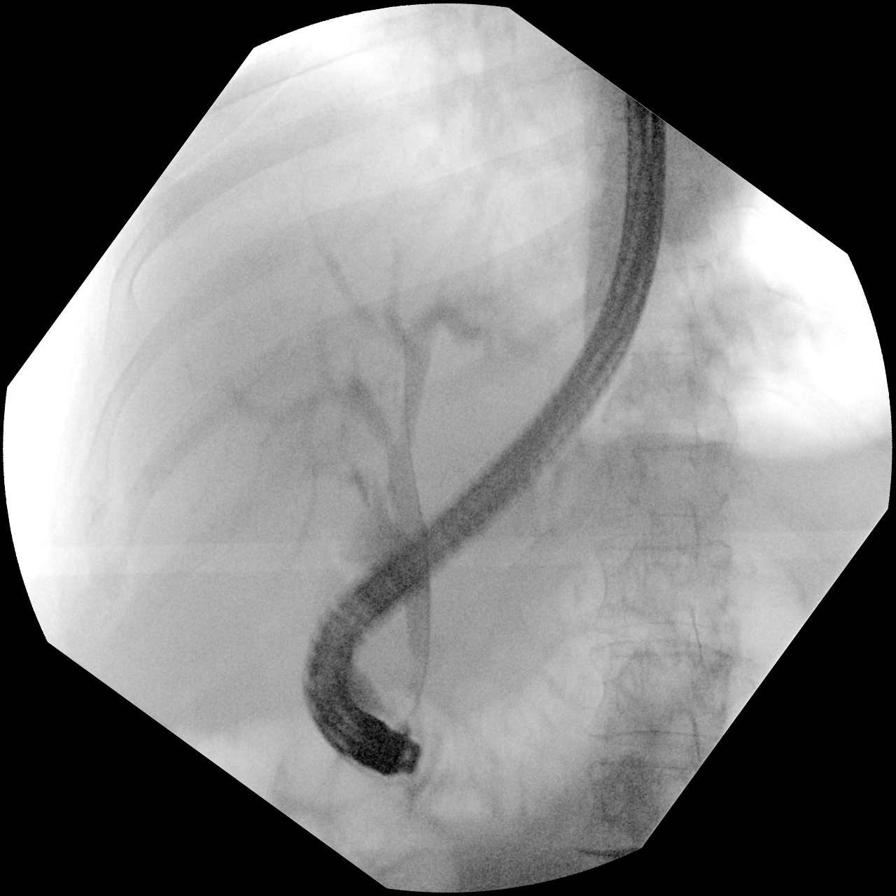
[im 4/4]
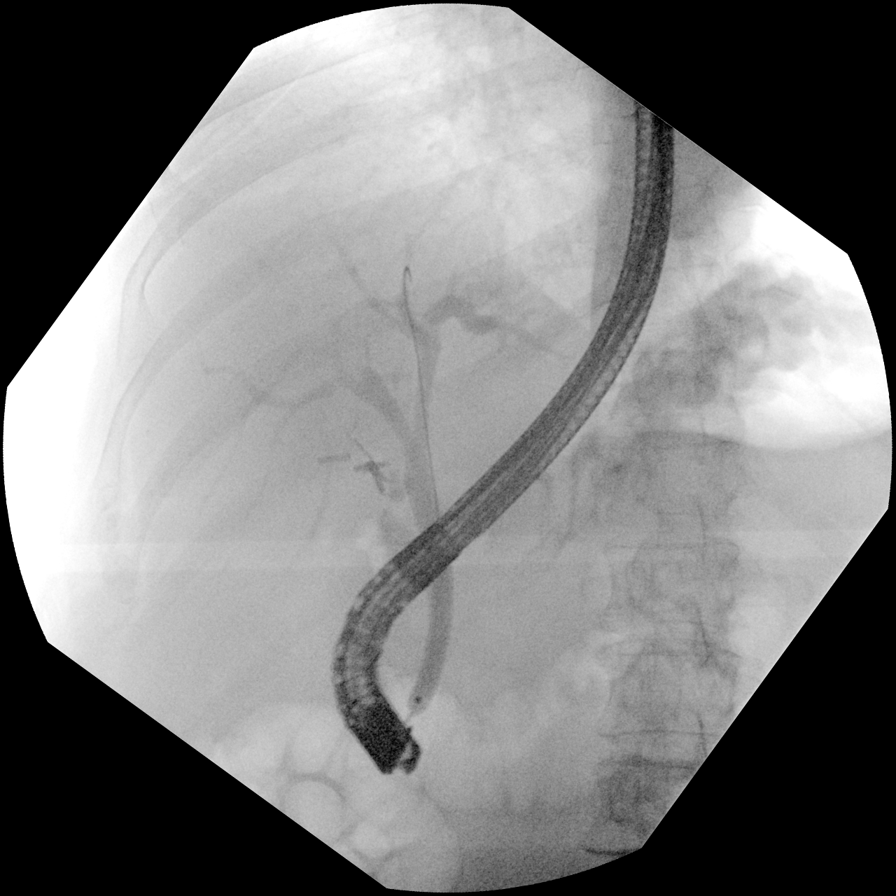

[4 of 4 positions shown; findings below may reference images not displayed]

FINDINGS: Duodenal scope position in the second portion the duodenum with
retrograde cannulation of the common bile duct. Cholangiogram
demonstrates normal caliber, patent central intrahepatic and
extrahepatic bile ducts. Status post cholecystectomy. Balloon
dilation of the distal common bile duct/anchors performed.
IMPRESSION: Endoscopic retrograde cholangiogram with balloon dilation of the
distal common bile duct.

These images were submitted for radiologic interpretation only.
Please see the procedural report for the amount of contrast and the
fluoroscopy time utilized.

## 2022-01-22 ENCOUNTER — Telehealth: Payer: Self-pay | Admitting: *Deleted

## 2022-01-22 NOTE — Telephone Encounter (Signed)
Received a call from Dr Orma Render with [facility] requesting to change sleep study order fromin lab to HST. Message will be sent to Dr Percival Spanish for approval.

## 2022-01-23 ENCOUNTER — Other Ambulatory Visit: Payer: Self-pay | Admitting: Cardiology

## 2022-01-23 DIAGNOSIS — G4700 Insomnia, unspecified: Secondary | ICD-10-CM

## 2022-01-23 DIAGNOSIS — I10 Essential (primary) hypertension: Secondary | ICD-10-CM

## 2022-01-23 DIAGNOSIS — I480 Paroxysmal atrial fibrillation: Secondary | ICD-10-CM

## 2022-01-23 NOTE — Telephone Encounter (Signed)
Spoke with dr Orma Render, aware okay to change.

## 2022-02-07 ENCOUNTER — Ambulatory Visit: Payer: Medicare PPO

## 2022-02-16 IMAGING — MR MR 3D RECON AT SCANNER
19 of 21 series · 45 of 48 positions shown · IV contrast (10 GADAVIST)
Comparison: Abdominal MRI 05/25/2020

:
CLINICAL DATA: Recent ERCP with removal of biliary stones.
Persistent elevated LFTs. Evaluate for choledocholithiasis

EXAM:
MRI ABDOMEN WITHOUT AND WITH CONTRAST (INCLUDING MRCP)
TECHNIQUE: Multiplanar multisequence MR imaging of the abdomen was performed
both before and after the administration of intravenous contrast.
Heavily T2-weighted images of the biliary and pancreatic ducts were
obtained, and three-dimensional MRCP images were rendered by post
processing.
CONTRAST: 10mL GADAVIST GADOBUTROL 1 MMOL/ML IV SOLN

[Series 2: T2 fat-sat · axial · 6.0mm · 1.33mm/px · 1 of 36 slices shown]
[im 1/36]
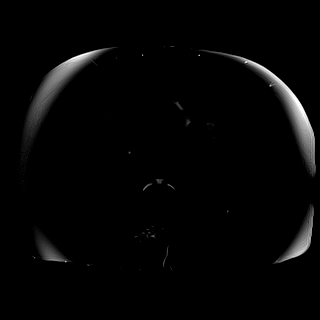

[Series 4: DWI · axial · 6.0mm · 1.59mm/px · z∈[-121,+131]mm · 2 of 72 slices shown (1 of 2)]
[im 1/72]
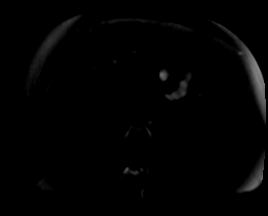
[im 72/72]
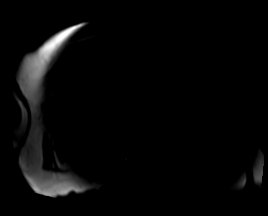

[Series 5: DWI · axial · 6.0mm · 1.59mm/px · 1 of 36 slices shown (2 of 2)]
[im 1/36]
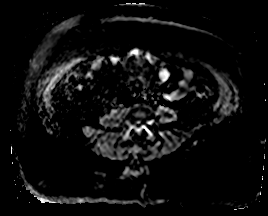

[Series 7: cor_3d_spc_trig · coronal · 1.0mm · 0.49mm/px · 2 of 72 slices shown]
[im 1/72]
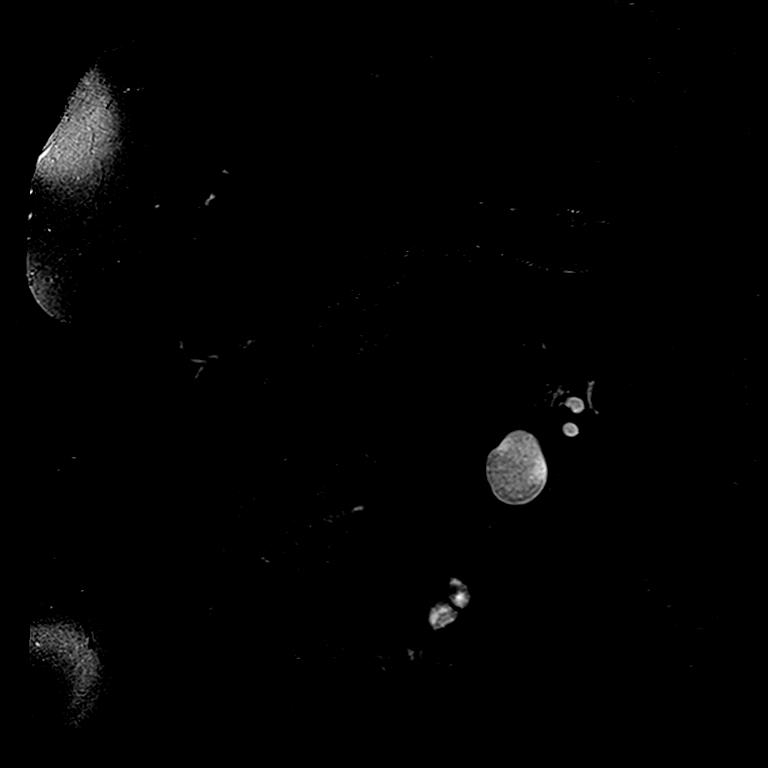
[im 72/72]
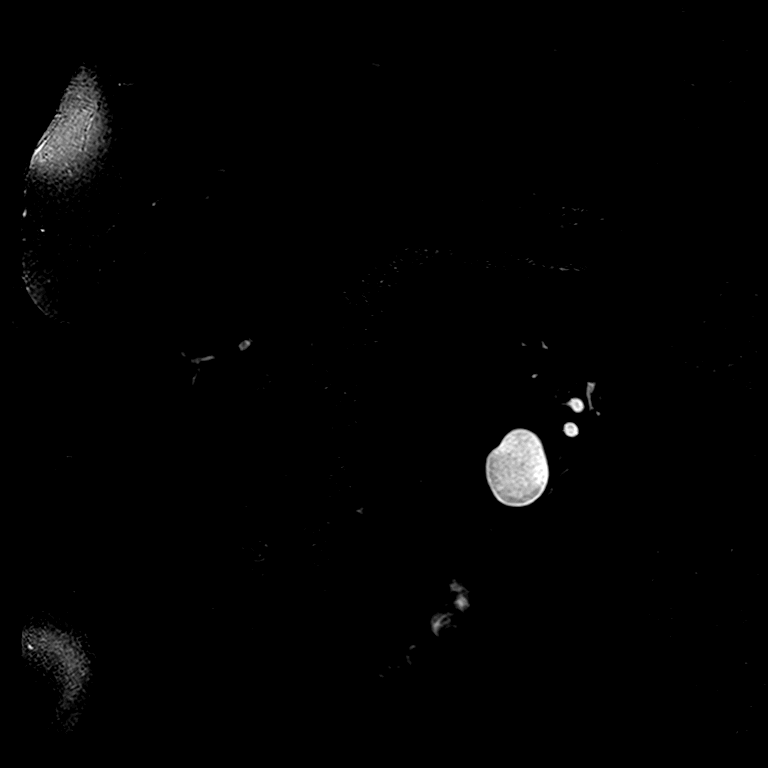

[Series 11: T2 · coronal · 6.0mm · 1.33mm/px · 2 of 40 slices shown (1 of 2)]
[im 1/40]
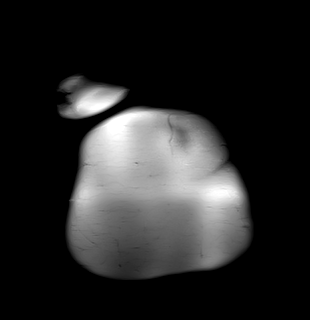
[im 40/40]
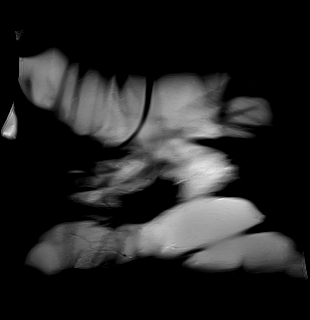

[Series 12: T1 · axial · 3.5mm · 1.33mm/px · z∈[-113,+136]mm · 3 of 72 slices shown (1 of 2)]
[im 1/72]
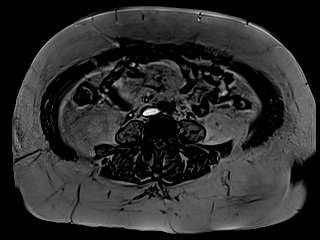
[im 36/72]
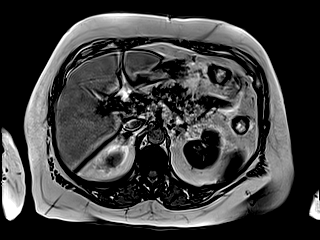
[im 72/72]
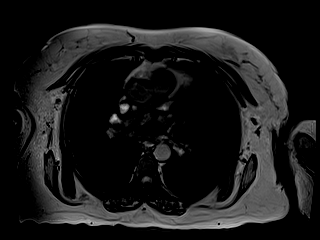

[Series 13: T1 · axial · 3.5mm · 1.33mm/px · z∈[-113,+136]mm · 3 of 72 slices shown (2 of 2)]
[im 1/72]
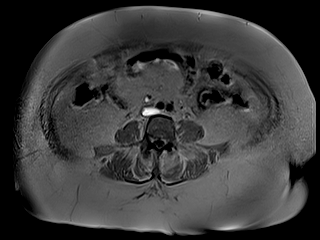
[im 36/72]
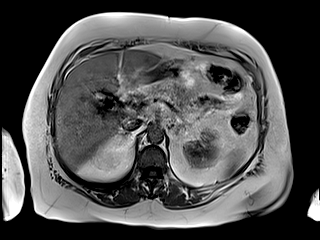
[im 72/72]
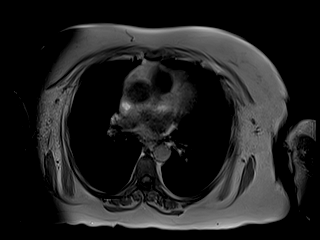

[Series 14: cor obl thk · sagittal · 50.0mm · 0.78mm/px · 1 of 9 slices shown]
[im 1/9]
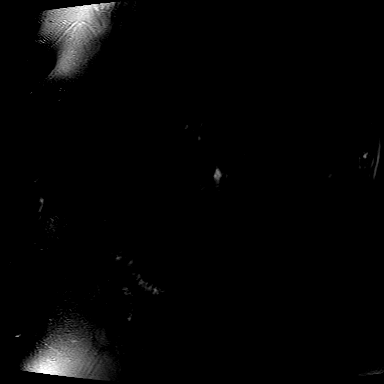

[Series 15: T2 · axial · 6.0mm · 1.33mm/px · 1 of 35 slices shown (2 of 2)]
[im 1/35]
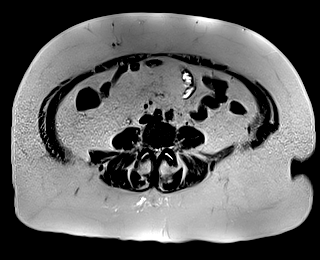

[Series 17: T1 dynamic · axial · 3.0mm · 1.33mm/px · z∈[-110,+127]mm · 3 of 80 slices shown (1 of 10)]
[im 1/80]
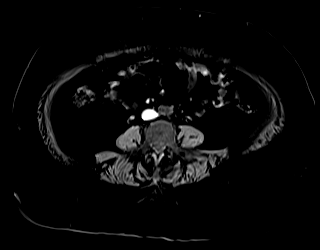
[im 40/80]
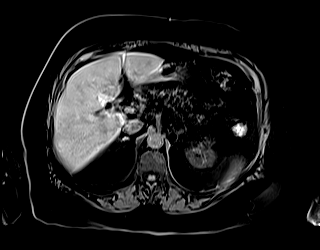
[im 80/80]
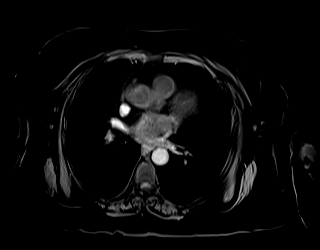

[Series 21: T1 dynamic · axial · 3.0mm · 1.33mm/px · z∈[-110,+127]mm · 3 of 80 slices shown (2 of 10)]
[im 1/80]
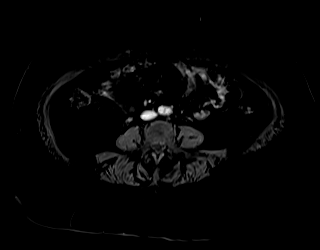
[im 40/80]
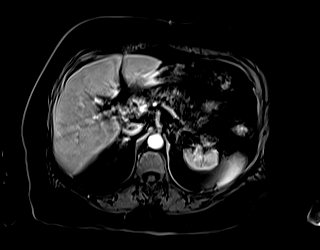
[im 80/80]
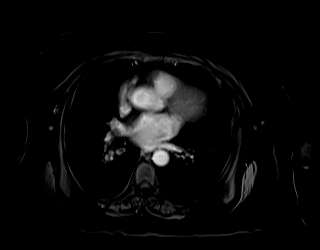

[Series 22: T1 dynamic · axial · 3.0mm · 1.33mm/px · z∈[-110,+127]mm · 3 of 80 slices shown (3 of 10)]
[im 1/80]
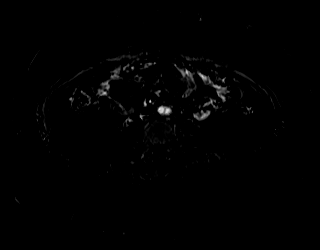
[im 40/80]
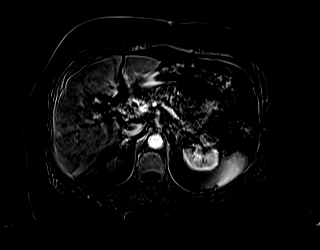
[im 80/80]
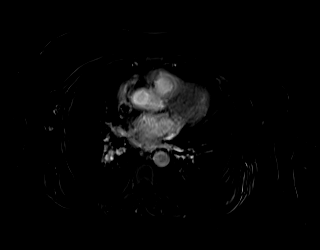

[Series 25: T1 dynamic · axial · 3.0mm · 1.33mm/px · z∈[-110,+127]mm · 3 of 80 slices shown (4 of 10)]
[im 1/80]
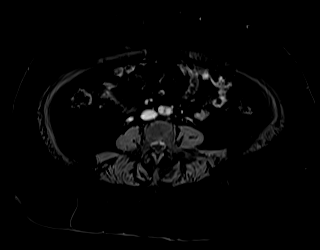
[im 40/80]
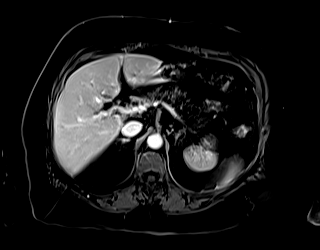
[im 80/80]
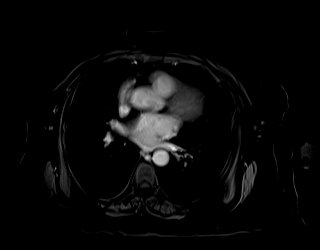

[Series 26: T1 dynamic · axial · 3.0mm · 1.33mm/px · z∈[-110,+127]mm · 3 of 80 slices shown (5 of 10)]
[im 1/80]
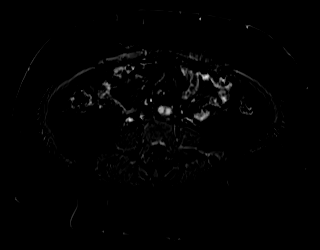
[im 40/80]
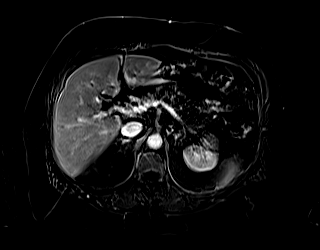
[im 80/80]
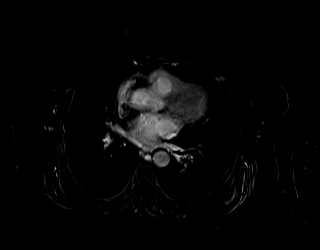

[Series 30: T1 dynamic · axial · 3.0mm · 1.33mm/px · z∈[-110,+127]mm · 3 of 80 slices shown (6 of 10)]
[im 1/80]
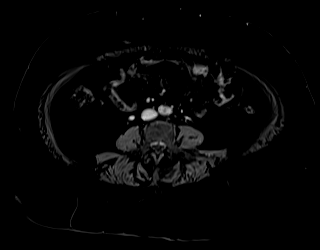
[im 40/80]
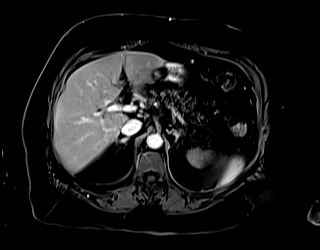
[im 80/80]
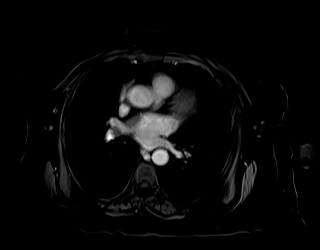

[Series 31: T1 dynamic · axial · 3.0mm · 1.33mm/px · z∈[-110,+127]mm · 3 of 80 slices shown (7 of 10)]
[im 1/80]
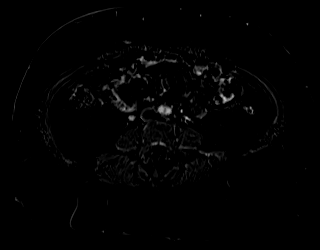
[im 40/80]
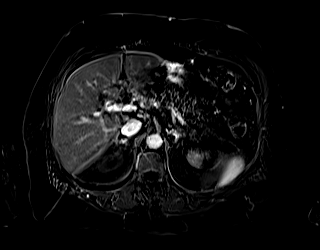
[im 80/80]
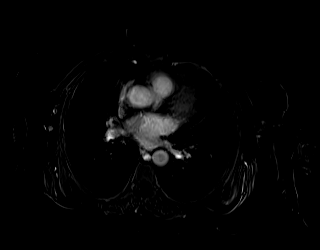

[Series 33: T1 dynamic · coronal · 5.0mm · 1.46mm/px · 2 of 60 slices shown (8 of 10)]
[im 1/60]
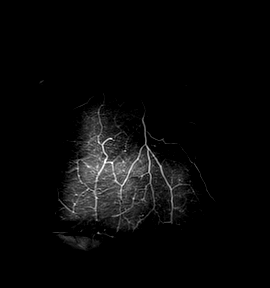
[im 60/60]
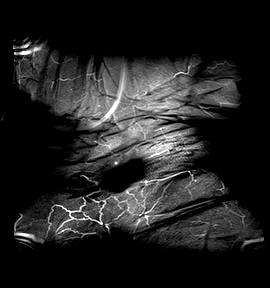

[Series 36: T1 dynamic · axial · 3.0mm · 1.33mm/px · z∈[-110,+127]mm · 3 of 80 slices shown (9 of 10)]
[im 1/80]
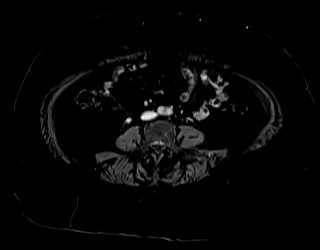
[im 40/80]
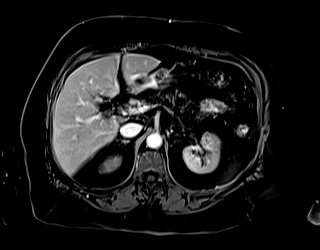
[im 80/80]
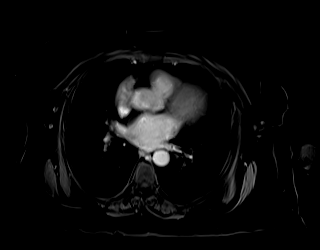

[Series 37: T1 dynamic · axial · 3.0mm · 1.33mm/px · z∈[-110,+127]mm · 3 of 80 slices shown (10 of 10)]
[im 1/80]
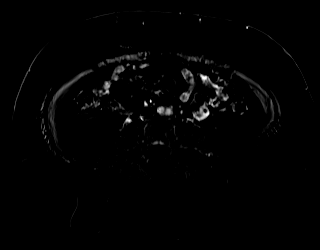
[im 40/80]
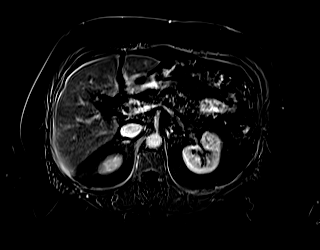
[im 80/80]
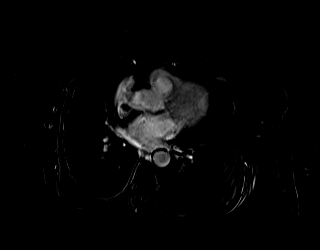

[45 of 48 positions shown; findings below may reference images not displayed]

FINDINGS: Lower chest: Lung bases are clear.

Hepatobiliary: No intrahepatic duct dilatation. No extrahepatic duct
dilatation. The common bile duct measures 5 mm in diameter. No
filling defect within the common bile duct which tapers normally to
the ampulla (image 65/series 7).

Postcholecystectomy.

No hepatic steatosis.

Pancreas: Normal pancreatic parenchymal intensity. No ductal
dilatation or inflammation. Fatty replacement throughout the
pancreatic parenchyma.

Spleen: Normal spleen.

Adrenals/urinary tract: Adrenal glands normal. Simple cyst of the
lower pole LEFT kidney is nonenhancing. Smaller similar cortical
cyst on the LEFT.

Stomach/Bowel: Stomach and limited of the small bowel is
unremarkable

Vascular/Lymphatic: Abdominal aortic normal caliber. No
retroperitoneal periportal lymphadenopathy.

Musculoskeletal: No aggressive osseous lesion
IMPRESSION: 1. No choledocholithiasis. No intrahepatic or extrahepatic biliary
duct dilatation.
2. Normal liver parenchyma.
3. No pancreatic inflammation or duct dilatation. Benign fatty
replacement throughout the pancreatic parenchyma.

## 2022-02-19 ENCOUNTER — Ambulatory Visit: Payer: Medicare PPO | Attending: Internal Medicine | Admitting: *Deleted

## 2022-02-19 DIAGNOSIS — Z5181 Encounter for therapeutic drug level monitoring: Secondary | ICD-10-CM | POA: Diagnosis not present

## 2022-02-19 DIAGNOSIS — I480 Paroxysmal atrial fibrillation: Secondary | ICD-10-CM | POA: Diagnosis not present

## 2022-02-19 LAB — POCT INR: INR: 2.2 (ref 2.0–3.0)

## 2022-02-19 NOTE — Patient Instructions (Signed)
Description   Continue taking 1 tablet daily, except 2 tablets on Mondays, Wednesdays, and Fridays. Recheck INR 4 weeks. [phone]

## 2022-02-25 ENCOUNTER — Ambulatory Visit (HOSPITAL_BASED_OUTPATIENT_CLINIC_OR_DEPARTMENT_OTHER): Payer: Medicare PPO | Attending: Cardiology | Admitting: Cardiology

## 2022-02-25 DIAGNOSIS — R0683 Snoring: Secondary | ICD-10-CM | POA: Diagnosis not present

## 2022-02-25 DIAGNOSIS — G4700 Insomnia, unspecified: Secondary | ICD-10-CM | POA: Diagnosis not present

## 2022-02-25 DIAGNOSIS — I10 Essential (primary) hypertension: Secondary | ICD-10-CM | POA: Insufficient documentation

## 2022-02-25 DIAGNOSIS — I480 Paroxysmal atrial fibrillation: Secondary | ICD-10-CM | POA: Diagnosis not present

## 2022-03-04 NOTE — Procedures (Signed)
   Patient Name: [name], [name] Date: 02/25/2022 Gender: Female D.O.B: [birth year] Age (years): 76 Referring Provider: Minus Breeding Height (inches): 1 Interpreting Physician: Fransico Him MD, ABSM Weight (lbs): 199 RPSGT: Jacolyn Reedy BMI: 39 MRN: [id number] Neck Size: 13.50  CLINICAL INFORMATION Sleep Study Type: HST  Indication for sleep study: N/A  Epworth Sleepiness Score: 3  SLEEP STUDY TECHNIQUE A multi-channel overnight portable sleep study was performed. The channels recorded were: nasal airflow, thoracic respiratory movement, and oxygen saturation with a pulse oximetry. Snoring was also monitored.  MEDICATIONS Patient self administered medications include: N/A.  SLEEP ARCHITECTURE Patient was studied for 309 minutes. The sleep efficiency was 100.0 % and the patient was supine for 0%. The arousal index was 0.0 per hour.  RESPIRATORY PARAMETERS The overall AHI was 0.4 per hour, with a central apnea index of 0 per hour.  The oxygen nadir was 93% during sleep.  CARDIAC DATA Mean heart rate during sleep was 64.1 bpm.  IMPRESSIONS - No significant obstructive sleep apnea occurred during this study (AHI = 0.4/h). - The patient had minimal or no oxygen desaturation during the study (Min O2 = 93%) - Patient snored 0.6% during the sleep.  DIAGNOSIS - Normal study  RECOMMENDATIONS - Avoid alcohol, sedatives and other CNS depressants that may worsen sleep apnea and disrupt normal sleep architecture. - Sleep hygiene should be reviewed to assess factors that may improve sleep quality. - Weight management and regular exercise should be initiated or continued.  [Electronically signed] 03/04/2022 11:56 AM  Fransico Him MD, ABSM Diplomate, American Board of Sleep Medicine

## 2022-03-07 ENCOUNTER — Telehealth: Payer: Self-pay | Admitting: *Deleted

## 2022-03-07 NOTE — Telephone Encounter (Signed)
The patient has been notified of the result and verbalized understanding.  All questions (if any) were answered. [name], [place] 03/07/2022 5:24 PM    Pt is aware and agreeable to normal results.

## 2022-03-07 NOTE — Telephone Encounter (Signed)
-----   Message from Lauralee Evener, [place] sent at 03/04/2022  1:53 PM EST -----  ----- Message ----- From: Sueanne Margarita, MD Sent: 03/04/2022  11:59 AM EST To: [facility] Sleep Studies  Please let patient know that sleep study showed no significant sleep apnea.

## 2022-03-12 ENCOUNTER — Ambulatory Visit (HOSPITAL_COMMUNITY)
Admission: RE | Admit: 2022-03-12 | Discharge: 2022-03-12 | Disposition: A | Discharge status: Home / Self Care | Payer: Medicare PPO | Source: Ambulatory Visit | Attending: Cardiology | Admitting: Cardiology

## 2022-03-12 DIAGNOSIS — R42 Dizziness and giddiness: Secondary | ICD-10-CM | POA: Diagnosis not present

## 2022-03-12 DIAGNOSIS — R059 Cough, unspecified: Secondary | ICD-10-CM | POA: Diagnosis not present

## 2022-03-12 DIAGNOSIS — R0609 Other forms of dyspnea: Secondary | ICD-10-CM | POA: Diagnosis not present

## 2022-03-12 DIAGNOSIS — I480 Paroxysmal atrial fibrillation: Secondary | ICD-10-CM | POA: Diagnosis not present

## 2022-03-12 DIAGNOSIS — I10 Essential (primary) hypertension: Secondary | ICD-10-CM | POA: Insufficient documentation

## 2022-03-12 DIAGNOSIS — Z87891 Personal history of nicotine dependence: Secondary | ICD-10-CM | POA: Diagnosis not present

## 2022-03-12 LAB — PULMONARY FUNCTION TEST
DL/VA % pred: 84 %
DL/VA: 3.62 ml/min/mmHg/L
DLCO unc % pred: 74 %
DLCO unc: 11.99 ml/min/mmHg
FEF 25-75 Post: 1.87 L/sec
FEF 25-75 Pre: 1.42 L/sec
FEF2575-%Change-Post: 31 %
FEF2575-%Pred-Post: 130 %
FEF2575-%Pred-Pre: 99 %
FEV1-%Change-Post: 6 %
FEV1-%Pred-Post: 92 %
FEV1-%Pred-Pre: 87 %
FEV1-Post: 1.56 L
FEV1-Pre: 1.47 L
FEV1FVC-%Change-Post: 0 %
FEV1FVC-%Pred-Pre: 108 %
FEV6-%Change-Post: 6 %
FEV6-%Pred-Post: 89 %
FEV6-%Pred-Pre: 84 %
FEV6-Post: 1.92 L
FEV6-Pre: 1.81 L
FEV6FVC-%Pred-Post: 105 %
FEV6FVC-%Pred-Pre: 105 %
FVC-%Change-Post: 6 %
FVC-%Pred-Post: 84 %
FVC-%Pred-Pre: 79 %
FVC-Post: 1.92 L
FVC-Pre: 1.81 L
Post FEV1/FVC ratio: 82 %
Post FEV6/FVC ratio: 100 %
Pre FEV1/FVC ratio: 81 %
Pre FEV6/FVC Ratio: 100 %
RV % pred: 83 %
RV: 1.69 L
TLC % pred: 87 %
TLC: 3.76 L

## 2022-03-12 MED ORDER — ALBUTEROL SULFATE (2.5 MG/3ML) 0.083% IN NEBU
2.5000 mg | INHALATION_SOLUTION | Freq: Once | RESPIRATORY_TRACT | Status: AC
  Administered 2022-03-12: 2.5 mg via RESPIRATORY_TRACT

## 2022-03-19 ENCOUNTER — Ambulatory Visit: Payer: Medicare PPO | Attending: Cardiology

## 2022-03-19 DIAGNOSIS — I480 Paroxysmal atrial fibrillation: Secondary | ICD-10-CM

## 2022-03-19 DIAGNOSIS — Z5181 Encounter for therapeutic drug level monitoring: Secondary | ICD-10-CM

## 2022-03-19 LAB — POCT INR: INR: 2.2 (ref 2.0–3.0)

## 2022-03-19 NOTE — Patient Instructions (Signed)
Continue taking 1 tablet daily, except 2 tablets on Mondays, Wednesdays, and Fridays. Recheck INR 6 weeks. [phone]

## 2022-04-01 ENCOUNTER — Other Ambulatory Visit: Payer: Self-pay | Admitting: Gastroenterology

## 2022-04-06 ENCOUNTER — Other Ambulatory Visit: Payer: Self-pay | Admitting: Cardiology

## 2022-04-06 DIAGNOSIS — I480 Paroxysmal atrial fibrillation: Secondary | ICD-10-CM

## 2022-04-12 ENCOUNTER — Other Ambulatory Visit: Payer: Self-pay | Admitting: Cardiology

## 2022-04-12 DIAGNOSIS — I4891 Unspecified atrial fibrillation: Secondary | ICD-10-CM | POA: Diagnosis not present

## 2022-04-12 DIAGNOSIS — I10 Essential (primary) hypertension: Secondary | ICD-10-CM | POA: Diagnosis not present

## 2022-04-12 DIAGNOSIS — Z136 Encounter for screening for cardiovascular disorders: Secondary | ICD-10-CM | POA: Diagnosis not present

## 2022-04-30 ENCOUNTER — Ambulatory Visit: Payer: Medicare PPO | Attending: Cardiology | Admitting: *Deleted

## 2022-04-30 ENCOUNTER — Other Ambulatory Visit: Payer: Self-pay | Admitting: Gastroenterology

## 2022-04-30 DIAGNOSIS — I480 Paroxysmal atrial fibrillation: Secondary | ICD-10-CM

## 2022-04-30 DIAGNOSIS — Z5181 Encounter for therapeutic drug level monitoring: Secondary | ICD-10-CM | POA: Diagnosis not present

## 2022-04-30 LAB — POCT INR: INR: 2.9 (ref 2.0–3.0)

## 2022-04-30 NOTE — Patient Instructions (Signed)
Description   Continue taking 1 tablet daily, except 2 tablets on Mondays, Wednesdays, and Fridays. Recheck INR 6 weeks. [phone]

## 2022-05-02 ENCOUNTER — Telehealth: Payer: Self-pay

## 2022-05-02 NOTE — Patient Instructions (Signed)
Visit Information  Thank you for taking time to visit with me today. Please don't hesitate to contact me if I can be of assistance to you.   Following are the goals we discussed today:   Goals Addressed             This Visit's Progress    COMPLETED: Care Coordination Activities - no follow up required       Care Coordination Interventions: Provided education to patient re: care coordination services Assessed social determinant of health barriers Annual wellness visit completed 04/12/21          If you are experiencing a Mental Health or [place] or need someone to talk to, please call the Suicide and Crisis Lifeline: 988 call the [place] National Suicide Prevention Lifeline: (703)215-5059 or TTY: 413-821-1560 TTY 541-403-2709) to talk to a trained counselor call 1-800-273-TALK (toll free, 24 hour hotline) go to [facility] [place] [phone]) call 911   Patient verbalizes understanding of instructions and care plan provided today and agrees to view in [place]. Active MyChart status and patient understanding of how to access instructions and care plan via MyChart confirmed with patient.     No further follow up required:    Peter Garter RN, Jackquline [place], [place] [facility] [phone]

## 2022-05-02 NOTE — Patient Outreach (Signed)
  Care Coordination   Initial Visit Note   05/02/2022 Name: [name] MRN: [id number] DOB: 08/07/1945  [name] is a 78 y.o. year old female who sees Collene Leyden, MD for primary care. I spoke with  Tommie Ard by phone today.  What matters to the patients health and wellness today?  No concerns today.  Denies any issues with heart racing or dizziness.    Goals Addressed             This Visit's Progress    COMPLETED: Care Coordination Activities - no follow up required       Care Coordination Interventions: Provided education to patient re: care coordination services Assessed social determinant of health barriers Annual wellness visit completed 04/12/21         SDOH assessments and interventions completed:  Yes  SDOH Interventions Today    Flowsheet Row Most Recent Value  SDOH Interventions   Food Insecurity Interventions Intervention Not Indicated  Housing Interventions Intervention Not Indicated  Transportation Interventions Intervention Not Indicated  Utilities Interventions Intervention Not Indicated        Care Coordination Interventions:  Yes, provided   Follow up plan: No further intervention required.   Encounter Outcome:  Pt. Visit Completed  Peter Garter RN, BSN,CCM, CDE Care Management Coordinator [facility] [phone]

## 2022-05-09 ENCOUNTER — Other Ambulatory Visit: Payer: Self-pay | Admitting: Family Medicine

## 2022-05-09 DIAGNOSIS — Z1231 Encounter for screening mammogram for malignant neoplasm of breast: Secondary | ICD-10-CM

## 2022-05-22 ENCOUNTER — Telehealth: Payer: Self-pay

## 2022-05-22 ENCOUNTER — Ambulatory Visit: Payer: Medicare PPO | Admitting: Gastroenterology

## 2022-05-22 ENCOUNTER — Encounter: Payer: Self-pay | Admitting: Gastroenterology

## 2022-05-22 VITALS — BP 136/76 | HR 61 | Ht 60.0 in | Wt 215.0 lb

## 2022-05-22 DIAGNOSIS — Z85038 Personal history of other malignant neoplasm of large intestine: Secondary | ICD-10-CM

## 2022-05-22 DIAGNOSIS — Z7902 Long term (current) use of antithrombotics/antiplatelets: Secondary | ICD-10-CM

## 2022-05-22 DIAGNOSIS — K8050 Calculus of bile duct without cholangitis or cholecystitis without obstruction: Secondary | ICD-10-CM

## 2022-05-22 DIAGNOSIS — K529 Noninfective gastroenteritis and colitis, unspecified: Secondary | ICD-10-CM | POA: Diagnosis not present

## 2022-05-22 MED ORDER — METOCLOPRAMIDE HCL 5 MG PO TABS: ORAL_TABLET | ORAL | 0 refills | Status: DC

## 2022-05-22 MED ORDER — NA SULFATE-K SULFATE-MG SULF 17.5-3.13-1.6 GM/177ML PO SOLN: 1.0000 | Freq: Once | ORAL | 0 refills | Status: AC

## 2022-05-22 NOTE — Telephone Encounter (Signed)
Pharmacy please advise on holding Coumadin prior to colonoscopy scheduled for 06/19/2022. Thank you.

## 2022-05-22 NOTE — Progress Notes (Addendum)
[facility] progress note:  History: [name] 05/22/2022  Referring provider: Collene Leyden, MD  Reason for consult/chief complaint: abd  LFTs (Pt states she is doing well today, pt needs a Rx refill)   Subjective  HPI: Summary of GI issues: Prior cholecystectomy, recurrent choledocholithiasis +/- cholangitis May and November 2022.  Required ERCP on both occasions.  On both occasions, the normalization of LFTs took several weeks.  January 2023 started ursodiol after office visit. March 2023, colonoscopy for heme positive stool.  Advanced adenoma removed piecemeal from ICV.  Suspicious ascending colon polyp, biopsy revealed adenocarcinoma.  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 Atrial fibrillation on chronic warfarin therapy. ______________________________________  She states that she has been doing well overall. She is still taking Ursodiol '600mg'$  daily. She has recovered well from right hemicolectomy in April 2023. She denies any abdominal pain. Patient states that she feels as if she has more frequent bowel movements since her surgery. She typically has x3-4 loose stools per day.  She is scheduled to see PA Almyra Deforest for routine cardiology follow up on 05/24/22. She is not a smoker. She last saw her PCP on 04/12/22. She states that she had routine labs done at that time.   ROS:  Review of Systems  Constitutional:  Negative for fever.  HENT:  Negative for hearing loss.   Eyes:  Negative for photophobia.  Respiratory:  Negative for cough.   Cardiovascular:  Negative for chest pain and palpitations.  Gastrointestinal:  Positive for diarrhea (loose stools). Negative for abdominal pain.  Genitourinary:  Negative for dysuria and hematuria.  Musculoskeletal:  Positive for arthralgias. Negative for myalgias.  Skin:  Negative for rash.  Allergic/Immunologic: Negative for environmental allergies.  Neurological:  Negative for  dizziness and headaches.  Hematological:  Does not bruise/bleed easily.  Psychiatric/Behavioral:  Negative for hallucinations and suicidal ideas.      Past Medical History: Past Medical History:  Diagnosis Date   Anemia    Asthma    Borderline hyperlipidemia    Borderline hypertension    Cancer ([place])    Complication of anesthesia    PONV   Dysrhythmia    Hypertension    Hypothyroid    s/p PTU therapy   Hypothyroidism 09/15/2008   Qualifier: Diagnosis of  By: Percival Spanish, MD, Johnell Comings (paroxysmal atrial fibrillation) (HCC)    PONV (postoperative nausea and vomiting)    From Dr. Rosezella [place] most recent cardiology office note October 2023: "Paroxysmal atrial fibrillation:    Ms. LEAHA RODELL has a CHA2DS2 - VASc score of 4.   She tolerates anticoagulation.  There have been no symptomatic paroxysms that I can record.  She tolerates anticoagulation.  Of note she is on unopposed flecainide without AV nodal blocking agents because of significant orthostasis in the past.   Essential hypertension:  Her blood pressure is mildly elevated supine which she has had significant orthostatic hypotension.  I think this is the cause of her dizziness.  I am not going to change her meds for management of this.    Dizziness:   I think this is probably related to her orthostasis.  I had her wear a monitor.  There were no sustained arrhythmias.  I have suggested compression garments and she needs to give this a trial before we would consider other etiologies if this fails.    SOB: She was to have pulmonary function testing and I will  reschedule these.  I do not think she is having ischemia given the work-up above.    Insomnia: Her STOP-BANG score is 4.  She has snoring.  She has daytime somnolence.  She has insomnia.  Her body habitus is consistent.  She needs sleep apnea study"  Past Surgical History: Past Surgical History:  Procedure Laterality Date   BILIARY DILATION  07/14/2020    Procedure: BILIARY DILATION;  Surgeon: Milus Banister, MD;  Location: [facility] ENDOSCOPY;  Service: Endoscopy;;   CESAREAN SECTION     CHOLECYSTECTOMY     ENDOSCOPIC RETROGRADE CHOLANGIOPANCREATOGRAPHY (ERCP) WITH PROPOFOL N/A 07/14/2020   Procedure: ENDOSCOPIC RETROGRADE CHOLANGIOPANCREATOGRAPHY (ERCP) WITH PROPOFOL;  Surgeon: Milus Banister, MD;  Location: [facility] ENDOSCOPY;  Service: Endoscopy;  Laterality: N/A;   ENDOSCOPIC RETROGRADE CHOLANGIOPANCREATOGRAPHY (ERCP) WITH PROPOFOL N/A 03/15/2021   Procedure: ENDOSCOPIC RETROGRADE CHOLANGIOPANCREATOGRAPHY (ERCP) WITH PROPOFOL;  Surgeon: Gatha Mayer, MD;  Location: [facility];  Service: Endoscopy;  Laterality: N/A;   ESOPHAGOGASTRODUODENOSCOPY (EGD) WITH PROPOFOL N/A 07/14/2020   Procedure: ESOPHAGOGASTRODUODENOSCOPY (EGD) WITH PROPOFOL;  Surgeon: Milus Banister, MD;  Location: [facility] ENDOSCOPY;  Service: Endoscopy;  Laterality: N/A;   KNEE SURGERY     right 2014, left 2015, arthroscipic   LEG SURGERY     Left leg d/t  tumor (benign)   REMOVAL OF STONES  07/14/2020   Procedure: REMOVAL OF STONES;  Surgeon: Milus Banister, MD;  Location: [facility] ENDOSCOPY;  Service: Endoscopy;;   SPHINCTEROTOMY  07/14/2020   Procedure: [name];  Surgeon: Milus Banister, MD;  Location: [place];  Service: Endoscopy;;   THYROID SURGERY     UPPER ESOPHAGEAL ENDOSCOPIC ULTRASOUND (EUS) N/A 07/14/2020   Procedure: UPPER ESOPHAGEAL ENDOSCOPIC ULTRASOUND (EUS);  Surgeon: Milus Banister, MD;  Location: [facility] ENDOSCOPY;  Service: Endoscopy;  Laterality: N/A;     Family History: Family History  Problem Relation Age of Onset   Breast cancer Mother        in 35's   Lung cancer Father    Breast cancer Sister 13   Cirrhosis Brother    Diabetes Brother    Heart disease Maternal Grandmother    Colon cancer Neg Hx    Colon polyps Neg Hx    Esophageal cancer Neg Hx    Ovarian cancer Neg Hx    Pancreatic cancer Neg Hx    Stomach cancer Neg Hx     Social  History: Social History   Socioeconomic History   Marital status: Married    Spouse name: Not on file   Number of children: Not on file   Years of education: Not on file   Highest education level: Not on file  Occupational History   Not on file  Tobacco Use   Smoking status: Former    Packs/day: 0.80    Years: 5.00    Total pack years: 4.00    Types: Cigarettes    Quit date: 04/08/1982    Years since quitting: 40.1    Passive exposure: Current   Smokeless tobacco: Never  Vaping Use   Vaping Use: Never used  Substance and Sexual Activity   Alcohol use: No   Drug use: No   Sexual activity: Not on file  Other Topics Concern   Not on file  Social History Narrative   Not on file   Social Determinants of Health   Financial Resource Strain: Not on file  Food Insecurity: No Food Insecurity (05/02/2022)   Hunger Vital Sign    Worried  About Running Out of Food in the Last Year: Never true    Ran Out of Food in the Last Year: Never true  Transportation Needs: No Transportation Needs (05/02/2022)   PRAPARE - Hydrologist (Medical): No    Lack of Transportation (Non-Medical): No  Physical Activity: Not on file  Stress: Not on file  Social Connections: Not on file    Allergies: Allergies  Allergen Reactions   Chocolate Itching    Itching mouth   Beef-Derived Products Itching   Cat Hair Extract Itching   Erythromycin     Other reaction(s): Unknown   Fish Allergy Itching   Phenergan [Promethazine] Nausea Only    Pt and family states her nausea worsens with phenergan rather than improving to the point she states she cant have it.    Pork-Derived Products Itching   Shellfish Allergy Itching   Soybean Extract Allergy Skin Test Itching   Strawberry Extract Itching   Naproxen Sodium Other (See Comments)    Hurt all over   Penicillins Rash    Has patient had a PCN reaction causing immediate rash, facial/tongue/throat swelling, SOB or  lightheadedness with hypotension: Yes Has patient had a PCN reaction causing severe rash involving mucus membranes or skin necrosis: Yes Has patient had a PCN reaction that required hospitalization: No Has patient had a PCN reaction occurring within the last 10 years: No If all of the above answers are "NO", then may proceed with Cephalosporin use. *Tolerated Cephalosporins April 2023     Outpatient Meds: Current Outpatient Medications  Medication Sig Dispense Refill   dicyclomine (BENTYL) 10 MG capsule Take 1 capsule (10 mg total) by mouth 2 (two) times daily before a meal. 30 capsule 0   flecainide (TAMBOCOR) 100 MG tablet TAKE 1 TABLET BY MOUTH TWICE DAILY. PLEASE KEEP UPCOMING APPT FOR FUTURE REFILLS. 180 tablet 3   omeprazole (PRILOSEC) 10 MG capsule Take 10 mg by mouth daily as needed (acid reflux).     ursodiol (ACTIGALL) 300 MG capsule Take 2 capsules by mouth once daily 60 capsule 0   warfarin (COUMADIN) 5 MG tablet TAKE 1 TO 2 TABLETS BY MOUTH ONCE DAILY OR AS DIRECTED BY COUMADIN CLINIC. 140 tablet 0   No current facility-administered medications for this visit.      ___________________________________________________________________ Objective   Exam:  BP 136/76   Pulse 61   Ht 5' (1.524 m)   Wt 215 lb (97.5 kg)   BMI 41.99 kg/m  Wt Readings from Last 3 Encounters:  05/22/22 215 lb (97.5 kg)  02/25/22 199 lb (90.3 kg)  01/17/22 201 lb 12.8 oz (91.5 kg)    General:  No acute distress, antalgic gait Eyes: sclera anicteric, no redness ENT: oral mucosa moist without lesions, no cervical or supraclavicular lymphadenopathy CV: RRR, no JVD, no peripheral edema Resp: clear to auscultation bilaterally, normal RR and effort noted GI: soft, no tenderness, with active bowel sounds. No guarding or palpable organomegaly noted. Skin; warm and dry, no rash or jaundice noted,  surgical scars well-healed Neuro: awake, alert and oriented x 3. Normal gross motor function and  fluent speech  Labs:     Latest Ref Rng & Units 07/24/2021    4:17 AM 07/23/2021    4:32 AM 07/22/2021    4:34 AM  CBC  WBC 4.0 - 10.5 K/uL 12.2  15.2  14.7   Hemoglobin 12.0 - 15.0 g/dL 9.6  10.5  9.7   Hematocrit 36.0 -  46.0 % 29.7  32.4  29.5   Platelets 150 - 400 K/uL 158  163  149       Latest Ref Rng & Units 07/24/2021    4:17 AM 07/23/2021    4:32 AM 07/22/2021    4:34 AM  CMP  Glucose 70 - 99 mg/dL 100  106  89   BUN 8 - 23 mg/dL '23  23  21   '$ Creatinine 0.44 - 1.00 mg/dL 1.06  1.43  1.33   Sodium 135 - 145 mmol/L 133  132  137   Potassium 3.5 - 5.1 mmol/L 3.9  3.7  3.2   Chloride 98 - 111 mmol/L 105  100  102   CO2 22 - 32 mmol/L '23  24  26   '$ Calcium 8.9 - 10.3 mg/dL 7.9  8.0  8.2       Assessment: History of colon cancer  Chronic diarrhea  Choledocholithiasis  Long term (current) use of antithrombotics/antiplatelets    No recurrence of choledocholithiasis or hospitalizations for same since starting ursodiol.  Will continue using that medicine.  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 Low-fat diet, as needed use of Imodium.  Due for surveillance colonoscopy as she is coming up on a year from colon cancer resection.  She was agreeable after discussion of procedure and risks.  The benefits and risks of the planned procedure were described in detail with the patient or (when appropriate) their health care proxy.  Risks were outlined as including, but not limited to, bleeding, infection, perforation, adverse medication reaction leading to cardiac or pulmonary decompensation, pancreatitis (if ERCP).  The limitation of incomplete mucosal visualization was also discussed.  No guarantees or warranties were given.   Plan: CBC, CMP- obtain primary care labs from 04/12/22 Will schedule colonoscopy 06/2022 Discuss holding Coumadin x4 days prior to  colonoscopy with Dr. Percival Spanish  Thank you for the courtesy of this consult.  Please call me with any questions or concerns.  Eugene Gavia  CC: Referring provider noted above    I,Alexis Herring,acting as a Education administrator for [place], MD.,have documented all relevant documentation on the behalf of Doran Stabler, MD,as directed by  Doran Stabler, MD while in the presence of Doran Stabler, MD.  Salina April III, MD, have reviewed all documentation for this visit. The documentation on 05/22/22 for the exam, diagnosis, procedures, and orders are all accurate and complete.    30 minutes were spent on this encounter (including chart review, history/exam, counseling/coordination of care, and documentation) > 50% of that time was spent on counseling and coordination of care.  _____________________________  Record review addendum:  Primary care labs 04/12/22 received and will be scanned to chart.  Hemoglobin 11.6, LFTs normal  - HD

## 2022-05-22 NOTE — Telephone Encounter (Signed)
   Patient Name: [name]  DOB: 01/07/1946 MRN: [id number]  Primary Cardiologist: Minus Breeding, MD  Clinical pharmacists have reviewed the patient's past medical history, labs, and current medications as part of preoperative protocol coverage. The following recommendations have been made: Patient with diagnosis of atrial fibrillation on warfarin for anticoagulation.     What type of surgery is being performed?     Colon  When is this surgery scheduled?     06/19/22      CHA2DS2-VASc Score = 3   This indicates a 3.2% annual risk of stroke. The patient's score is based upon: CHF History: 0 HTN History: 1 Diabetes History: 0 Stroke History: 0 Vascular Disease History: 0 Age Score: 1 Gender Score: 1   CrCl 47 (with adjusted body weight) Platelet count 158   Per office protocol, patient can hold warfarin for 5 days prior to procedure.   Patient will not need bridging with Lovenox (enoxaparin) around procedure.  I will route this recommendation to the requesting party via Epic fax function and remove from pre-op pool.  Please call with questions.  Mable Fill, Marissa Nestle, NP 05/22/2022, 4:04 PM

## 2022-05-22 NOTE — Patient Instructions (Addendum)
_______________________________________________________  If your blood pressure at your visit was 140/90 or greater, please contact your primary care physician to follow up on this.  _______________________________________________________  If you are age 75 or older, your body mass index should be between 23-30. Your Body mass index is 41.99 kg/m. If this is out of the aforementioned range listed, please consider follow up with your Primary Care Provider.  If you are age 22 or younger, your body mass index should be between 19-25. Your Body mass index is 41.99 kg/m. If this is out of the aformentioned range listed, please consider follow up with your Primary Care Provider.   ________________________________________________________  The [facility] providers would like to encourage you to use [facility] to communicate with providers for non-urgent requests or questions.  Due to long hold times on the telephone, sending your provider a message by [facility] may be a faster and more efficient way to get a response.  Please allow 48 business hours for a response.  Please remember that this is for non-urgent requests.  _______________________________________________________  [name] have been scheduled for a colonoscopy. Please follow written instructions given to you at your visit today.  Please pick up your prep supplies at the pharmacy within the next 1-3 days. If you use inhalers (even only as needed), please bring them with you on the day of your procedure.

## 2022-05-22 NOTE — Telephone Encounter (Signed)
Patient with diagnosis of atrial fibrillation on warfarin for anticoagulation.    What type of surgery is being performed?     Colon  When is this surgery scheduled?     06/19/22    CHA2DS2-VASc Score = 3   This indicates a 3.2% annual risk of stroke. The patient's score is based upon: CHF History: 0 HTN History: 1 Diabetes History: 0 Stroke History: 0 Vascular Disease History: 0 Age Score: 1 Gender Score: 1   CrCl 47 (with adjusted body weight) Platelet count 158  Per office protocol, patient can hold warfarin for 5 days prior to procedure.   Patient will not need bridging with Lovenox (enoxaparin) around procedure.  **This guidance is not considered finalized until pre-operative APP has relayed final recommendations.**

## 2022-05-22 NOTE — Telephone Encounter (Signed)
[facility] Pre-operative Risk Assessment     Request for surgical clearance:     Endoscopy Procedure  What type of surgery is being performed?     Colon  When is this surgery scheduled?     06/19/22  What type of clearance is required ?   Pharmacy  Are there any medications that need to be held prior to surgery and how long? Coumadin 4 days  Practice name and name of physician performing surgery?      [facility]  What is your office phone and fax number?      Phone- [phone]  [phone]  Anesthesia type (None, local, MAC, general) ?       MAC

## 2022-05-22 NOTE — Progress Notes (Deleted)
[facility] progress note:  History: [name] 05/22/2022  Referring provider: Collene Leyden, MD  Reason for consult/chief complaint: No chief complaint on file.   Subjective  HPI: Summary of GI issues: Prior cholecystectomy, recurrent choledocholithiasis +/- cholangitis May and November 2022.  Required ERCP on both occasions.  On both occasions, the normalization of LFTs took several weeks.  January 2023 started ursodiol after office visit. March 2023, colonoscopy for heme positive stool.  Advanced adenoma removed piecemeal from ICV.  Suspicious ascending colon polyp, biopsy revealed adenocarcinoma.  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 Atrial fibrillation on chronic warfarin therapy. ______________________________________  ***   ROS:  Review of Systems   Past Medical History: Past Medical History:  Diagnosis Date   Anemia    Asthma    Borderline hyperlipidemia    Borderline hypertension    Cancer (HCC)    Complication of anesthesia    PONV   Dysrhythmia    Hypertension    Hypothyroid    s/p PTU therapy   Hypothyroidism 09/15/2008   Qualifier: Diagnosis of  By: Jessica Spanish, MD, [name] (paroxysmal atrial fibrillation) ([name])    PONV (postoperative nausea and vomiting)    From Dr. Rosezella [name] most recent cardiology office note October 2023: "Paroxysmal atrial fibrillation:    [name] has a CHA2DS2 - VASc score of 4.   She tolerates anticoagulation.  There have been no symptomatic paroxysms that I can record.  She tolerates anticoagulation.  Of note she is on unopposed flecainide without AV nodal blocking agents because of significant orthostasis in the past.   Essential hypertension:  Her blood pressure is mildly elevated supine which she has had significant orthostatic hypotension.  I think this is the cause of her dizziness.  I am not going to change her meds for management of this.     Dizziness:   I think this is probably related to her orthostasis.  I had her wear a monitor.  There were no sustained arrhythmias.  I have suggested compression garments and she needs to give this a trial before we would consider other etiologies if this fails.    SOB: She was to have pulmonary function testing and I will reschedule these.  I do not think she is having ischemia given the work-up above.    Insomnia: Her STOP-BANG score is 4.  She has snoring.  She has daytime somnolence.  She has insomnia.  Her body habitus is consistent.  She needs sleep apnea study"  Past Surgical History: Past Surgical History:  Procedure Laterality Date   BILIARY DILATION  07/14/2020   Procedure: BILIARY DILATION;  Surgeon: Jessica Banister, MD;  Location: [facility] ENDOSCOPY;  Service: Endoscopy;;   CESAREAN SECTION     CHOLECYSTECTOMY     ENDOSCOPIC RETROGRADE CHOLANGIOPANCREATOGRAPHY (ERCP) WITH PROPOFOL N/A 07/14/2020   Procedure: ENDOSCOPIC RETROGRADE CHOLANGIOPANCREATOGRAPHY (ERCP) WITH PROPOFOL;  Surgeon: Jessica Banister, MD;  Location: [facility] ENDOSCOPY;  Service: Endoscopy;  Laterality: N/A;   ENDOSCOPIC RETROGRADE CHOLANGIOPANCREATOGRAPHY (ERCP) WITH PROPOFOL N/A 03/15/2021   Procedure: ENDOSCOPIC RETROGRADE CHOLANGIOPANCREATOGRAPHY (ERCP) WITH PROPOFOL;  Surgeon: Jessica Mayer, MD;  Location: [place];  Service: Endoscopy;  Laterality: N/A;   ESOPHAGOGASTRODUODENOSCOPY (EGD) WITH PROPOFOL N/A 07/14/2020   Procedure: ESOPHAGOGASTRODUODENOSCOPY (EGD) WITH PROPOFOL;  Surgeon: Jessica Banister, MD;  Location: [facility] ENDOSCOPY;  Service: Endoscopy;  Laterality: N/A;   KNEE SURGERY     right 2014, left  2015, arthroscipic   LEG SURGERY     Left leg d/t  tumor (benign)   REMOVAL OF STONES  07/14/2020   Procedure: REMOVAL OF STONES;  Surgeon: Jessica Banister, MD;  Location: [facility] ENDOSCOPY;  Service: Endoscopy;;   SPHINCTEROTOMY  07/14/2020   Procedure: [name];  Surgeon: Jessica Banister, MD;  Location: [place];  Service: Endoscopy;;   THYROID SURGERY     UPPER ESOPHAGEAL ENDOSCOPIC ULTRASOUND (EUS) N/A 07/14/2020   Procedure: UPPER ESOPHAGEAL ENDOSCOPIC ULTRASOUND (EUS);  Surgeon: Jessica Banister, MD;  Location: [facility] ENDOSCOPY;  Service: Endoscopy;  Laterality: N/A;     Family History: Family History  Problem Relation Age of Onset   Breast cancer Mother        in 97's   Lung cancer Father    Breast cancer Sister 85   Cirrhosis Brother    Diabetes Brother    Heart disease Maternal Grandmother    Colon cancer Neg Hx    Colon polyps Neg Hx    Esophageal cancer Neg Hx    Ovarian cancer Neg Hx    Pancreatic cancer Neg Hx    Stomach cancer Neg Hx     Social History: Social History   Socioeconomic History   Marital status: Married    Spouse name: Not on file   Number of children: Not on file   Years of education: Not on file   Highest education level: Not on file  Occupational History   Not on file  Tobacco Use   Smoking status: Former    Packs/day: 0.80    Years: 5.00    Total pack years: 4.00    Types: Cigarettes    Quit date: 04/08/1982    Years since quitting: 40.1    Passive exposure: Current   Smokeless tobacco: Never  Vaping Use   Vaping Use: Never used  Substance and Sexual Activity   Alcohol use: No   Drug use: No   Sexual activity: Not on file  Other Topics Concern   Not on file  Social History Narrative   Not on file   Social Determinants of Health   Financial Resource Strain: Not on file  Food Insecurity: No Food Insecurity (05/02/2022)   Hunger Vital Sign    Worried About Running Out of Food in the Last Year: Never true    Ran Out of Food in the Last Year: Never true  Transportation Needs: No Transportation Needs (05/02/2022)   PRAPARE - Hydrologist (Medical): No    Lack of Transportation (Non-Medical): No  Physical Activity: Not on file  Stress: Not on file  Social Connections: Not on file     Allergies: Allergies  Allergen Reactions   Chocolate Itching    Itching mouth   Beef-Derived Products Itching   Cat Hair Extract Itching   Erythromycin     Other reaction(s): Unknown   Fish Allergy Itching   Phenergan [Promethazine] Nausea Only    Pt and family states her nausea worsens with phenergan rather than improving to the point she states she cant have it.    Pork-Derived Products Itching   Shellfish Allergy Itching   Soybean Extract Allergy Skin Test Itching   Strawberry Extract Itching   Naproxen Sodium Other (See Comments)    Hurt all over   Penicillins Rash    Has patient had a PCN reaction causing immediate rash, facial/tongue/throat swelling, SOB or lightheadedness with hypotension: Yes Has patient had a  PCN reaction causing severe rash involving mucus membranes or skin necrosis: Yes Has patient had a PCN reaction that required hospitalization: No Has patient had a PCN reaction occurring within the last 10 years: No If all of the above answers are "NO", then may proceed with Cephalosporin use. *Tolerated Cephalosporins April 2023     Outpatient Meds: Current Outpatient Medications  Medication Sig Dispense Refill   dicyclomine (BENTYL) 10 MG capsule Take 1 capsule (10 mg total) by mouth 2 (two) times daily before a meal. 30 capsule 0   flecainide (TAMBOCOR) 100 MG tablet TAKE 1 TABLET BY MOUTH TWICE DAILY. PLEASE KEEP UPCOMING APPT FOR FUTURE REFILLS. 180 tablet 3   omeprazole (PRILOSEC) 10 MG capsule Take 10 mg by mouth daily as needed (acid reflux).     ursodiol (ACTIGALL) 300 MG capsule Take 2 capsules by mouth once daily 60 capsule 0   warfarin (COUMADIN) 5 MG tablet TAKE 1 TO 2 TABLETS BY MOUTH ONCE DAILY OR AS DIRECTED BY COUMADIN CLINIC. 140 tablet 0   No current facility-administered medications for this visit.      ___________________________________________________________________ Objective   Exam:  There were no vitals taken for this  visit. Wt Readings from Last 3 Encounters:  02/25/22 199 lb (90.3 kg)  01/17/22 201 lb 12.8 oz (91.5 kg)  07/23/21 215 lb 13.3 oz (97.9 kg)    General: ***  Eyes: sclera anicteric, no redness ENT: oral mucosa moist without lesions, no cervical or supraclavicular lymphadenopathy CV: ***, no JVD, no peripheral edema Resp: clear to auscultation bilaterally, normal RR and effort noted GI: soft, *** tenderness, with active bowel sounds. No guarding or palpable organomegaly noted. Skin; warm and dry, no rash or jaundice noted Neuro: awake, alert and oriented x 3. Normal gross motor function and fluent speech  Labs:     Latest Ref Rng & Units 07/24/2021    4:17 AM 07/23/2021    4:32 AM 07/22/2021    4:34 AM  CBC  WBC 4.0 - 10.5 K/uL 12.2  15.2  14.7   Hemoglobin 12.0 - 15.0 g/dL 9.6  10.5  9.7   Hematocrit 36.0 - 46.0 % 29.7  32.4  29.5   Platelets 150 - 400 K/uL 158  163  149       Latest Ref Rng & Units 07/24/2021    4:17 AM 07/23/2021    4:32 AM 07/22/2021    4:34 AM  CMP  Glucose 70 - 99 mg/dL 100  106  89   BUN 8 - 23 mg/dL 23  23  21   $ Creatinine 0.44 - 1.00 mg/dL 1.06  1.43  1.33   Sodium 135 - 145 mmol/L 133  132  137   Potassium 3.5 - 5.1 mmol/L 3.9  3.7  3.2   Chloride 98 - 111 mmol/L 105  100  102   CO2 22 - 32 mmol/L 23  24  26   $ Calcium 8.9 - 10.3 mg/dL 7.9  8.0  8.2      Radiologic Studies:  ***  Assessment: No diagnosis found.  ***  Plan: CBC, CMP ***  Thank you for the courtesy of this consult.  Please call me with any questions or concerns.  Nelida Meuse III  CC: Referring provider noted above

## 2022-05-23 NOTE — Telephone Encounter (Signed)
Patient has been notified and aware to hold her coumadin for the 5 days before her colonoscopy.

## 2022-05-24 ENCOUNTER — Encounter: Payer: Self-pay | Admitting: Physician Assistant

## 2022-05-24 ENCOUNTER — Ambulatory Visit: Payer: Medicare PPO | Attending: Physician Assistant | Admitting: Physician Assistant

## 2022-05-24 VITALS — BP 138/74 | HR 56 | Ht 59.0 in | Wt 216.8 lb

## 2022-05-24 DIAGNOSIS — G4700 Insomnia, unspecified: Secondary | ICD-10-CM | POA: Diagnosis not present

## 2022-05-24 DIAGNOSIS — E785 Hyperlipidemia, unspecified: Secondary | ICD-10-CM | POA: Diagnosis not present

## 2022-05-24 DIAGNOSIS — I10 Essential (primary) hypertension: Secondary | ICD-10-CM | POA: Diagnosis not present

## 2022-05-24 DIAGNOSIS — R0789 Other chest pain: Secondary | ICD-10-CM

## 2022-05-24 DIAGNOSIS — I480 Paroxysmal atrial fibrillation: Secondary | ICD-10-CM | POA: Diagnosis not present

## 2022-05-24 DIAGNOSIS — R42 Dizziness and giddiness: Secondary | ICD-10-CM

## 2022-05-24 MED ORDER — FLECAINIDE ACETATE 100 MG PO TABS: 100.0000 mg | ORAL_TABLET | Freq: Two times a day (BID) | ORAL | 3 refills | Status: DC

## 2022-05-24 NOTE — Patient Instructions (Signed)
Medication Instructions:  No changes *If you need a refill on your cardiac medications before your next appointment, please call your pharmacy*  Follow-Up: At [facility], you and your health needs are our priority.  As part of our continuing mission to provide you with exceptional heart care, we have created designated Provider Care Teams.  These Care Teams include your primary Cardiologist (physician) and Advanced Practice Providers (APPs -  Physician Assistants and Nurse Practitioners) who all work together to provide you with the care you need, when you need it.  We recommend signing up for the patient portal called "MyChart".  Sign up information is provided on this After Visit Summary.  MyChart is used to connect with patients for Virtual Visits (Telemedicine).  Patients are able to view lab/test results, encounter notes, upcoming appointments, etc.  Non-urgent messages can be sent to your provider as well.   To learn more about what you can do with MyChart, go to [email or web address].    Your next appointment:   6 month(s)  Provider:   Minus Breeding, MD     Other Instructions Please monitor chest discomfort. If it increases in frequency, duration, and intensity; please notify us.

## 2022-05-24 NOTE — Progress Notes (Unsigned)
Cardiology Office Note:    Date:  05/26/2022   ID:  [name], DOB May 10, 1945, MRN [id number]  PCP:  Collene Leyden, [place] Providers Cardiologist:  Minus Breeding, MD     Referring MD: Kelton Pillar, MD   Chief Complaint  Patient presents with   Follow-up    Seen for Dr. Percival Spanish    History of Present Illness:    [name] is a 77 y.o. female with a hx of hypertension, borderline hyperlipidemia, hypothyroidism and PAF on coumadin. She was in the ED in December 2017 for palpitation, EKG showed atrial fibrillation. She was not on systemic anticoagulation at the time. She eventually self converted to sinus rhythm on IV Lopressor. During the follow-up on 04/03/2016, she was agreeable to start on eliquis. However due to the inability to afford eliquis, her compliance was very limited. Therefore she was transitioned to Coumadin in February 2018.  Although she was initially hesitant to consider flecainide, she was eventually agreeable to be placed on flecainide therapy.  Last echocardiogram obtained on 04/18/2016 showed EF 60-65%, grade 2 DD. Myoview obtained on 05/22/2016 showed EF 62%, low risk study, no ischemia.  She returned to the ED in August 2018 with recurrent atrial fibrillation.  TSH and free T4 were normal.  During previous visit in April 2022, her beta-blocker was discontinued due to bradycardia.  Unfortunately she developed orthostatic dizziness despite discontinuation of her beta-blocker.  Dr. Percival Spanish recommended a thigh compression stocking. Heart monitor placed in 2022 showed no significant bradycardia that can contribute to her dizziness.   I previously saw the patient in August 2022 at which time he was still having occasional orthostatic dizziness when she suddenly changed body positions.  Myoview obtained on 05/24/2021 was low risk, no evidence of ischemia, EF 63%.  Patient was last seen by Dr. Percival Spanish on 01/17/2022 at which time she had multiple  complaints with shortness of breath on exertion and the dizziness.  For her DOE, outpatient pulmonary function test was recommended.  She was also having insomnia and daytime somnolence, it was recommended she undergo sleep study.  PFT showed no significant abnormality to explain her dyspnea on exertion.  Patient presents today for follow-up.  She continues to have insomnia issue.  She does not believe she has sleep apnea, she says her problem is falling asleep and staying asleep.  I urged her to discuss with her PCP.  She will occasionally still have palpitation, in the past 56-month she says she may had 3 episode of palpitation, however no which lasted longer than 5 minutes.  I decided to leave her on the current dose of flecainide.  She still have orthostatic dizziness if she changed body position too quickly, therefore I did not try to add on any blood pressure medication, will allow her blood pressure to be borderline elevated.  According to patient, she does have mild chronic chest discomfort that is not new.  She says it only occurs more with more strenuous activity but not with every day activity.  It typically occurs once a month and does not last longer than 5 minutes.  The characteristic, frequency and intensity of the symptom has not changed when compared to when she had her nuclear stress test early 2023.  She is aware to contact cardiology service if her symptom worsens in intensity, duration or frequency.  If that is the case, I may recommend either a coronary CTA versus cardiac catheterization for more definitive  evaluation.   Past Medical History:  Diagnosis Date   Anemia    Asthma    Borderline hyperlipidemia    Borderline hypertension    Cancer ([place])    Complication of anesthesia    PONV   Dysrhythmia    Hypertension    Hypothyroid    s/p PTU therapy   Hypothyroidism 09/15/2008   Qualifier: Diagnosis of  By: Percival Spanish, MD, Johnell Comings (paroxysmal atrial fibrillation)  ([place])    PONV (postoperative nausea and vomiting)     Past Surgical History:  Procedure Laterality Date   BILIARY DILATION  07/14/2020   Procedure: BILIARY DILATION;  Surgeon: Milus Banister, MD;  Location: [facility] ENDOSCOPY;  Service: Endoscopy;;   CESAREAN SECTION     CHOLECYSTECTOMY     ENDOSCOPIC RETROGRADE CHOLANGIOPANCREATOGRAPHY (ERCP) WITH PROPOFOL N/A 07/14/2020   Procedure: ENDOSCOPIC RETROGRADE CHOLANGIOPANCREATOGRAPHY (ERCP) WITH PROPOFOL;  Surgeon: Milus Banister, MD;  Location: [facility] ENDOSCOPY;  Service: Endoscopy;  Laterality: N/A;   ENDOSCOPIC RETROGRADE CHOLANGIOPANCREATOGRAPHY (ERCP) WITH PROPOFOL N/A 03/15/2021   Procedure: ENDOSCOPIC RETROGRADE CHOLANGIOPANCREATOGRAPHY (ERCP) WITH PROPOFOL;  Surgeon: Gatha Mayer, MD;  Location: [place];  Service: Endoscopy;  Laterality: N/A;   ESOPHAGOGASTRODUODENOSCOPY (EGD) WITH PROPOFOL N/A 07/14/2020   Procedure: ESOPHAGOGASTRODUODENOSCOPY (EGD) WITH PROPOFOL;  Surgeon: Milus Banister, MD;  Location: [facility] ENDOSCOPY;  Service: Endoscopy;  Laterality: N/A;   KNEE SURGERY     right 2014, left 2015, arthroscipic   LEG SURGERY     Left leg d/t  tumor (benign)   REMOVAL OF STONES  07/14/2020   Procedure: REMOVAL OF STONES;  Surgeon: Milus Banister, MD;  Location: [facility] ENDOSCOPY;  Service: Endoscopy;;   SPHINCTEROTOMY  07/14/2020   Procedure: [name];  Surgeon: Milus Banister, MD;  Location: [place];  Service: Endoscopy;;   THYROID SURGERY     UPPER ESOPHAGEAL ENDOSCOPIC ULTRASOUND (EUS) N/A 07/14/2020   Procedure: UPPER ESOPHAGEAL ENDOSCOPIC ULTRASOUND (EUS);  Surgeon: Milus Banister, MD;  Location: [facility] ENDOSCOPY;  Service: Endoscopy;  Laterality: N/A;    Current Medications: Current Meds  Medication Sig   dicyclomine (BENTYL) 10 MG capsule Take 1 capsule (10 mg total) by mouth 2 (two) times daily before a meal.   metoCLOPramide (REGLAN) 5 MG tablet Take one tablet before each bowel prep   omeprazole (PRILOSEC) 10 MG capsule  Take 10 mg by mouth daily as needed (acid reflux).   ursodiol (ACTIGALL) 300 MG capsule Take 2 capsules by mouth once daily   warfarin (COUMADIN) 5 MG tablet TAKE 1 TO 2 TABLETS BY MOUTH ONCE DAILY OR AS DIRECTED BY COUMADIN CLINIC.   [DISCONTINUED] flecainide (TAMBOCOR) 100 MG tablet TAKE 1 TABLET BY MOUTH TWICE DAILY. PLEASE KEEP UPCOMING APPT FOR FUTURE REFILLS.     Allergies:   Chocolate, Beef-derived products, Cat hair extract, Erythromycin, Fish allergy, Phenergan [promethazine], Pork-derived products, Shellfish allergy, Soybean extract allergy skin test, Strawberry extract, Naproxen sodium, and Penicillins   Social History   Socioeconomic History   Marital status: Married    Spouse name: Not on file   Number of children: Not on file   Years of education: Not on file   Highest education level: Not on file  Occupational History   Not on file  Tobacco Use   Smoking status: Former    Packs/day: 0.80    Years: 5.00    Total pack years: 4.00    Types: Cigarettes    Quit date: 04/08/1982    Years  since quitting: 40.1    Passive exposure: Current   Smokeless tobacco: Never  Vaping Use   Vaping Use: Never used  Substance and Sexual Activity   Alcohol use: No   Drug use: No   Sexual activity: Not on file  Other Topics Concern   Not on file  Social History Narrative   Not on file   Social Determinants of Health   Financial Resource Strain: Not on file  Food Insecurity: No Food Insecurity (05/02/2022)   Hunger Vital Sign    Worried About Running Out of Food in the Last Year: Never true    Ran Out of Food in the Last Year: Never true  Transportation Needs: No Transportation Needs (05/02/2022)   PRAPARE - Hydrologist (Medical): No    Lack of Transportation (Non-Medical): No  Physical Activity: Not on file  Stress: Not on file  Social Connections: Not on file     Family History: The patient's family history includes Breast cancer in her mother;  Breast cancer (age of onset: 7) in her sister; Cirrhosis in her brother; Diabetes in her brother; Heart disease in her maternal grandmother; Lung cancer in her father. There is no history of Colon cancer, Colon polyps, Esophageal cancer, Ovarian cancer, Pancreatic cancer, or Stomach cancer.  ROS:   Please see the history of present illness.     All other systems reviewed and are negative.  EKGs/Labs/Other Studies Reviewed:    The following studies were reviewed today:  Myoview 05/24/2021   The study is normal. The study is low risk.   LV perfusion is normal. There is no evidence of ischemia.   Left ventricular function is normal. Nuclear stress EF: 63 %. The left ventricular ejection fraction is normal (55-65%). End diastolic cavity size is normal.   Prior study available for comparison from 05/22/2016. - no changes from previous  EKG:  EKG is not ordered today.    Recent Labs: 06/12/2021: ALT 11 07/24/2021: BUN 23; Creatinine, Ser 1.06; Hemoglobin 9.6; Platelets 158; Potassium 3.9; Sodium 133  Recent Lipid Panel    Component Value Date/Time   CHOL  05/27/2008 0700    149        ATP III CLASSIFICATION:  <200     mg/dL   Desirable  200-239  mg/dL   Borderline High  >=240    mg/dL   High          TRIG 54 05/27/2008 0700   HDL 51 05/27/2008 0700   CHOLHDL 2.9 05/27/2008 0700   VLDL 11 05/27/2008 0700   LDLCALC  05/27/2008 0700    87        Total Cholesterol/HDL:CHD Risk Coronary Heart Disease Risk Table                     Men   Women  1/2 Average Risk   3.4   3.3  Average Risk       5.0   4.4  2 X Average Risk   9.6   7.1  3 X Average Risk  23.4   11.0        Use the calculated Patient Ratio above and the CHD Risk Table to determine the patient's CHD Risk.        ATP III CLASSIFICATION (LDL):  <100     mg/dL   Optimal  100-129  mg/dL   Near or Above  Optimal  130-159  mg/dL   Borderline  160-189  mg/dL   High  >190     mg/dL   Very High     Risk  Assessment/Calculations:    CHA2DS2-VASc Score = 3   This indicates a 3.2% annual risk of stroke. The patient's score is based upon: CHF History: 0 HTN History: 1 Diabetes History: 0 Stroke History: 0 Vascular Disease History: 0 Age Score: 1 Gender Score: 1          Physical Exam:    VS:  BP 138/74   Pulse (!) 56   Ht 4' 11"$  (1.499 m)   Wt 216 lb 12.8 oz (98.3 kg)   SpO2 99%   BMI 43.79 kg/m        Wt Readings from Last 3 Encounters:  05/24/22 216 lb 12.8 oz (98.3 kg)  05/22/22 215 lb (97.5 kg)  02/25/22 199 lb (90.3 kg)     GEN:  Well nourished, well developed in no acute distress HEENT: Normal NECK: No JVD; No carotid bruits LYMPHATICS: No lymphadenopathy CARDIAC: RRR, no murmurs, rubs, gallops RESPIRATORY:  Clear to auscultation without rales, wheezing or rhonchi  ABDOMEN: Soft, non-tender, non-distended MUSCULOSKELETAL:  No edema; No deformity  SKIN: Warm and dry NEUROLOGIC:  Alert and oriented x 3 PSYCHIATRIC:  Normal affect   ASSESSMENT:    1. PAF (paroxysmal atrial fibrillation) ([place])   2. Essential hypertension   3. Hyperlipidemia LDL goal <100   4. Insomnia, unspecified type   5. Orthostatic dizziness   6. Chest discomfort    PLAN:    In order of problems listed above:  PAF: Continue flecainide.  Not on beta-blocker due to history of bradycardia.  On Coumadin  Hypertension: No longer on blood pressure medication due to history of orthostatic dizziness.  BP borderline elevated, will hold off on adding additional medication  Insomnia: Dr. Percival Spanish previously recommended a sleep study, this was never done.  Patient was hesitant to proceed.  She has trouble falling asleep and staying asleep.  Orthostatic dizziness: Will hold off on adding any blood pressure medication.  Will allow blood pressure to drift slightly higher  Chest discomfort: This appears to be chronic for this patient.  She says the chest pain has been present prior to the  previous stress test in February 2023.  Stress test was normal.  Symptom occurs with more strenuous activity but not with every day activity.  It only occurs about once a month and self resolves within 5 minutes.  It has not shown any increase in frequency, duration or intensity.  Patient has been advised to contact cardiology service if the symptom does not change in frequency, duration or intensity.           Medication Adjustments/Labs and Tests Ordered: Current medicines are reviewed at length with the patient today.  Concerns regarding medicines are outlined above.  No orders of the defined types were placed in this encounter.  Meds ordered this encounter  Medications   flecainide (TAMBOCOR) 100 MG tablet    Sig: Take 1 tablet (100 mg total) by mouth 2 (two) times daily.    Dispense:  180 tablet    Refill:  3    Patient Instructions  Medication Instructions:  No changes *If you need a refill on your cardiac medications before your next appointment, please call your pharmacy*  Follow-Up: At [facility], you and your health needs are our priority.  As part of our continuing mission  to provide you with exceptional heart care, we have created designated Provider Care Teams.  These Care Teams include your primary Cardiologist (physician) and Advanced Practice Providers (APPs -  Physician Assistants and Nurse Practitioners) who all work together to provide you with the care you need, when you need it.  We recommend signing up for the patient portal called "MyChart".  Sign up information is provided on this After Visit Summary.  MyChart is used to connect with patients for Virtual Visits (Telemedicine).  Patients are able to view lab/test results, encounter notes, upcoming appointments, etc.  Non-urgent messages can be sent to your provider as well.   To learn more about what you can do with MyChart, go to [email or web address].    Your next appointment:   6  month(s)  Provider:   Minus Breeding, MD     Other Instructions Please monitor chest discomfort. If it increases in frequency, duration, and intensity; please notify us.     Hilbert Corrigan, [place]  05/26/2022 10:35 PM    [facility]

## 2022-05-26 ENCOUNTER — Encounter: Payer: Self-pay | Admitting: Physician Assistant

## 2022-05-28 ENCOUNTER — Other Ambulatory Visit: Payer: Self-pay | Admitting: Gastroenterology

## 2022-06-11 ENCOUNTER — Ambulatory Visit: Payer: Medicare PPO | Attending: Cardiology | Admitting: *Deleted

## 2022-06-11 DIAGNOSIS — Z5181 Encounter for therapeutic drug level monitoring: Secondary | ICD-10-CM | POA: Diagnosis not present

## 2022-06-11 DIAGNOSIS — I480 Paroxysmal atrial fibrillation: Secondary | ICD-10-CM

## 2022-06-11 LAB — POCT INR: POC INR: 2.5

## 2022-06-11 NOTE — Patient Instructions (Signed)
Description   Hold warfarin 3/8-3/12 Resume warfarin in the evening  of the procedure (3/13) or as directed by doctor (take an extra half tablet with usual dose for 2 days then resume normal dose). Normal dose:1 tablet daily, except 2 tablets on Mondays, Wednesdays, and Fridays.  Recheck INR 1 week post procedure. Coumadin Clinic [phone]

## 2022-06-15 ENCOUNTER — Other Ambulatory Visit: Payer: Self-pay | Admitting: Cardiology

## 2022-06-15 DIAGNOSIS — I480 Paroxysmal atrial fibrillation: Secondary | ICD-10-CM

## 2022-06-17 NOTE — Telephone Encounter (Signed)
Refill request for warfarin:  Last INR was 2.5 on 06/11/22 Next INR due 06/26/21 LOV was 05/27/22  Janan Ridge PA  Refill approved.

## 2022-06-19 ENCOUNTER — Encounter: Payer: Self-pay | Admitting: Gastroenterology

## 2022-06-19 ENCOUNTER — Ambulatory Visit (AMBULATORY_SURGERY_CENTER): Payer: Medicare PPO | Admitting: Gastroenterology

## 2022-06-19 VITALS — BP 129/60 | HR 50 | Temp 97.3°F | Resp 11 | Ht 60.0 in | Wt 215.0 lb

## 2022-06-19 DIAGNOSIS — K635 Polyp of colon: Secondary | ICD-10-CM

## 2022-06-19 DIAGNOSIS — D123 Benign neoplasm of transverse colon: Secondary | ICD-10-CM | POA: Diagnosis not present

## 2022-06-19 DIAGNOSIS — Z85038 Personal history of other malignant neoplasm of large intestine: Secondary | ICD-10-CM

## 2022-06-19 DIAGNOSIS — K529 Noninfective gastroenteritis and colitis, unspecified: Secondary | ICD-10-CM

## 2022-06-19 DIAGNOSIS — Z08 Encounter for follow-up examination after completed treatment for malignant neoplasm: Secondary | ICD-10-CM | POA: Diagnosis not present

## 2022-06-19 MED ORDER — SODIUM CHLORIDE 0.9 % IV SOLN: 500.0000 mL | INTRAVENOUS | Status: DC

## 2022-06-19 NOTE — Patient Instructions (Signed)
    Resume Coumadin (at prior dosage ) today.   Handouts on polyps,diverticulosis,& hemorrhoids given to you today.   Await pathology results on polyp removed   YOU HAD AN ENDOSCOPIC PROCEDURE TODAY AT [facility]:   Refer to the procedure report that was given to you for any specific questions about what was found during the examination.  If the procedure report does not answer your questions, please call your gastroenterologist to clarify.  If you requested that your care partner not be given the details of your procedure findings, then the procedure report has been included in a sealed envelope for you to review at your convenience later.  YOU SHOULD EXPECT: Some feelings of bloating in the abdomen. Passage of more gas than usual.  Walking can help get rid of the air that was put into your GI tract during the procedure and reduce the bloating. If you had a lower endoscopy (such as a colonoscopy or flexible sigmoidoscopy) you may notice spotting of blood in your stool or on the toilet paper. If you underwent a bowel prep for your procedure, you may not have a normal bowel movement for a few days.  Please Note:  You might notice some irritation and congestion in your nose or some drainage.  This is from the oxygen used during your procedure.  There is no need for concern and it should clear up in a day or so.  SYMPTOMS TO REPORT IMMEDIATELY:  Following lower endoscopy (colonoscopy or flexible sigmoidoscopy):  Excessive amounts of blood in the stool  Significant tenderness or worsening of abdominal pains  Swelling of the abdomen that is new, acute  Fever of 100F or higher    For urgent or emergent issues, a gastroenterologist can be reached at any hour by calling [phone]. Do not use MyChart messaging for urgent concerns.    DIET:  We do recommend a small meal at first, but then you may proceed to your regular diet.  Drink plenty of fluids but you should avoid  alcoholic beverages for 24 hours.  ACTIVITY:  You should plan to take it easy for the rest of today and you should NOT DRIVE or use heavy machinery until tomorrow (because of the sedation medicines used during the test).    FOLLOW UP: Our staff will call the number listed on your records the next business day following your procedure.  We will call around 7:15- 8:00 am to check on you and address any questions or concerns that you may have regarding the information given to you following your procedure. If we do not reach you, we will leave a message.     If any biopsies were taken you will be contacted by phone or by letter within the next 1-3 weeks.  Please call us at [phone] if you have not heard about the biopsies in 3 weeks.    SIGNATURES/CONFIDENTIALITY: You and/or your care partner have signed paperwork which will be entered into your electronic medical record.  These signatures attest to the fact that that the information above on your After Visit Summary has been reviewed and is understood.  Full responsibility of the confidentiality of this discharge information lies with you and/or your care-partner.

## 2022-06-19 NOTE — Progress Notes (Signed)
Called to room to assist during endoscopic procedure.  Patient ID and intended procedure confirmed with present staff. Received instructions for my participation in the procedure from the performing physician.  

## 2022-06-19 NOTE — Op Note (Signed)
[facility] Patient Name: [name] Procedure Date: 06/19/2022 9:10 AM MRN: [id number] Endoscopist: [name] L. Loletha Carrow , MD, [id number] Age: 77 Referring MD:  Date of Birth: 1945-09-24 Gender: Female Account #: [id number] Procedure:                Colonoscopy Indications:              High risk colon cancer surveillance: Personal                            history of colon cancer                           Right hemicolectomy April 2023 for small, early                            stage colorectal cancer. Medicines:                Monitored Anesthesia Care Procedure:                Pre-Anesthesia Assessment:                           - Prior to the procedure, a History and Physical                            was performed, and patient medications and                            allergies were reviewed. The patient's tolerance of                            previous anesthesia was also reviewed. The risks                            and benefits of the procedure and the sedation                            options and risks were discussed with the patient.                            All questions were answered, and informed consent                            was obtained. Prior Anticoagulants: The patient has                            taken Coumadin (warfarin), last dose was 5 days                            prior to procedure. ASA Grade Assessment: III - A                            patient with severe systemic disease. After  reviewing the risks and benefits, the patient was                            deemed in satisfactory condition to undergo the                            procedure.                           After obtaining informed consent, the colonoscope                            was passed under direct vision. Throughout the                            procedure, the patient's blood pressure, pulse, and                            oxygen  saturations were monitored continuously. The                            CF [id number] was introduced through the anus                            and advanced to the the ileocolonic anastomosis.                            The colonoscopy was performed without difficulty.                            The patient tolerated the procedure well. The                            quality of the bowel preparation was good. The                            neo-terminal ileum, the rectum and ileo-colonic                            anastomosis were photographed. The bowel                            preparation used was SUPREP via split dose                            instruction. Scope In: 9:22:15 AM Scope Out: 9:31:36 AM Scope Withdrawal Time: 0 hours 7 minutes 8 seconds  Total Procedure Duration: 0 hours 9 minutes 21 seconds  Findings:                 The perianal and digital rectal examinations were                            normal.  There was evidence of a prior side-to-side                            ileo-colonic anastomosis in the proximal transverse                            colon. This was patent and was characterized by                            healthy appearing mucosa. The anastomosis was                            traversed.                           A 4 mm polyp was found in the anastomosis. The                            polyp was sessile, and at a location where ileal                            and colonic tissue joined. The polyp was removed                            with a cold snare. Resection and retrieval were                            complete.                           Multiple diverticula were found in the left colon.                           Internal hemorrhoids were found.                           The exam was otherwise without abnormality on                            direct and retroflexion views. Complications:            No immediate  complications. Estimated Blood Loss:     Estimated blood loss was minimal. Impression:               - Patent side-to-side ileo-colonic anastomosis,                            characterized by healthy appearing mucosa.                           - One 4 mm polyp at the anastomosis, removed with a                            cold snare. Resected and retrieved.                           -  Diverticulosis in the left colon.                           - Internal hemorrhoids.                           - The examination was otherwise normal on direct                            and retroflexion views. Recommendation:           - Patient has a contact number available for                            emergencies. The signs and symptoms of potential                            delayed complications were discussed with the                            patient. Return to normal activities tomorrow.                            Written discharge instructions were provided to the                            patient.                           - Resume previous diet.                           - Continue present medications.                           - Resume Coumadin (warfarin) at prior dose today.                            Refer to managing physician for further adjustment                            of therapy.                           - Await pathology results.                           - Repeat colonoscopy is recommended for                            surveillance. The colonoscopy date will be                            determined after pathology results from today's                            exam become available for review. Tahnee Cifuentes L. Loletha Carrow, MD  06/19/2022 9:37:24 AM This report has been signed electronically.

## 2022-06-19 NOTE — Progress Notes (Signed)
Uneventful anesthetic. Report to pacu rn. Vss. Care resumed by rn. 

## 2022-06-19 NOTE — Progress Notes (Signed)
No changes to clinical history since GI office visit on 05/22/22.  The patient is appropriate for an endoscopic procedure in the ambulatory setting.  - Nikiyah Fackler Danis, MD    

## 2022-06-20 ENCOUNTER — Telehealth: Payer: Self-pay

## 2022-06-20 ENCOUNTER — Ambulatory Visit
Admission: RE | Admit: 2022-06-20 | Discharge: 2022-06-20 | Disposition: A | Discharge status: Home / Self Care | Payer: Medicare PPO | Source: Ambulatory Visit | Attending: Family Medicine | Admitting: Family Medicine

## 2022-06-20 DIAGNOSIS — Z1231 Encounter for screening mammogram for malignant neoplasm of breast: Secondary | ICD-10-CM

## 2022-06-20 NOTE — Telephone Encounter (Signed)
  Follow up Call-     06/19/2022    8:12 AM 06/07/2021    7:45 AM  Call back number  Post procedure Call Back phone  # [phone]  Permission to leave phone message Yes Yes     Patient questions:  Do you have a fever, pain , or abdominal swelling? No. Pain Score  0 *  Have you tolerated food without any problems? Yes.    Have you been able to return to your normal activities? Yes.    Do you have any questions about your discharge instructions: Diet   No. Medications  No. Follow up visit  No.  Do you have questions or concerns about your Care? No.  Actions: * If pain score is 4 or above: No action needed, pain <4.

## 2022-06-24 ENCOUNTER — Other Ambulatory Visit: Payer: Self-pay | Admitting: Gastroenterology

## 2022-06-25 ENCOUNTER — Encounter: Payer: Self-pay | Admitting: Gastroenterology

## 2022-06-27 ENCOUNTER — Ambulatory Visit: Payer: Medicare PPO | Attending: Cardiology

## 2022-06-27 DIAGNOSIS — I480 Paroxysmal atrial fibrillation: Secondary | ICD-10-CM | POA: Diagnosis not present

## 2022-06-27 DIAGNOSIS — Z5181 Encounter for therapeutic drug level monitoring: Secondary | ICD-10-CM | POA: Diagnosis not present

## 2022-06-27 LAB — POCT INR: INR: 2.8 (ref 2.0–3.0)

## 2022-06-27 NOTE — Patient Instructions (Signed)
Description   Continue taking 1 tablet daily, except 2 tablets on Mondays, Wednesdays, and Fridays.  Recheck INR 3 weeks [facility] [phone]

## 2022-07-18 ENCOUNTER — Ambulatory Visit: Payer: Medicare PPO | Attending: Cardiology | Admitting: *Deleted

## 2022-07-18 DIAGNOSIS — Z5181 Encounter for therapeutic drug level monitoring: Secondary | ICD-10-CM | POA: Diagnosis not present

## 2022-07-18 DIAGNOSIS — I480 Paroxysmal atrial fibrillation: Secondary | ICD-10-CM

## 2022-07-18 LAB — POCT INR: POC INR: 3.5

## 2022-07-18 NOTE — Patient Instructions (Signed)
Description   Hold warfarin today and then continue taking 1 tablet daily, except 2 tablets on Mondays, Wednesdays, and Fridays.  Recheck INR 3 weeks [facility] Clinic [phone]

## 2022-07-22 ENCOUNTER — Other Ambulatory Visit: Payer: Self-pay | Admitting: Gastroenterology

## 2022-08-08 ENCOUNTER — Ambulatory Visit: Payer: Medicare PPO | Attending: Cardiology | Admitting: *Deleted

## 2022-08-08 DIAGNOSIS — Z5181 Encounter for therapeutic drug level monitoring: Secondary | ICD-10-CM | POA: Diagnosis not present

## 2022-08-08 DIAGNOSIS — I480 Paroxysmal atrial fibrillation: Secondary | ICD-10-CM | POA: Diagnosis not present

## 2022-08-08 LAB — POCT INR: POC INR: 3.7

## 2022-08-08 NOTE — Patient Instructions (Signed)
Description   Hold warfarin today and START taking warfarin 1 tablet daily except for 2 tablets on Mondays and Fridays.  Recheck INR 3 weeks [facility] Clinic [phone]

## 2022-08-29 ENCOUNTER — Ambulatory Visit: Payer: Medicare PPO | Attending: Cardiology | Admitting: *Deleted

## 2022-08-29 DIAGNOSIS — Z5181 Encounter for therapeutic drug level monitoring: Secondary | ICD-10-CM | POA: Diagnosis not present

## 2022-08-29 DIAGNOSIS — I480 Paroxysmal atrial fibrillation: Secondary | ICD-10-CM

## 2022-08-29 LAB — POCT INR: INR: 3.2 — AB (ref 2.0–3.0)

## 2022-08-29 NOTE — Patient Instructions (Addendum)
Description   Today take 1/2 tablet then START taking warfarin 1 tablet daily except for 2 tablets on Mondays.  Recheck INR 3 weeks [facility]in Clinic [phone]

## 2022-09-12 DIAGNOSIS — I4891 Unspecified atrial fibrillation: Secondary | ICD-10-CM | POA: Diagnosis not present

## 2022-09-12 DIAGNOSIS — R030 Elevated blood-pressure reading, without diagnosis of hypertension: Secondary | ICD-10-CM | POA: Diagnosis not present

## 2022-09-12 DIAGNOSIS — M1990 Unspecified osteoarthritis, unspecified site: Secondary | ICD-10-CM | POA: Diagnosis not present

## 2022-09-12 DIAGNOSIS — K219 Gastro-esophageal reflux disease without esophagitis: Secondary | ICD-10-CM | POA: Diagnosis not present

## 2022-09-12 DIAGNOSIS — I951 Orthostatic hypotension: Secondary | ICD-10-CM | POA: Diagnosis not present

## 2022-09-12 DIAGNOSIS — Z7901 Long term (current) use of anticoagulants: Secondary | ICD-10-CM | POA: Diagnosis not present

## 2022-09-12 DIAGNOSIS — R32 Unspecified urinary incontinence: Secondary | ICD-10-CM | POA: Diagnosis not present

## 2022-09-12 DIAGNOSIS — D6869 Other thrombophilia: Secondary | ICD-10-CM | POA: Diagnosis not present

## 2022-09-12 DIAGNOSIS — N1831 Chronic kidney disease, stage 3a: Secondary | ICD-10-CM | POA: Diagnosis not present

## 2022-09-19 ENCOUNTER — Ambulatory Visit: Payer: Medicare PPO | Attending: Internal Medicine | Admitting: *Deleted

## 2022-09-19 DIAGNOSIS — Z5181 Encounter for therapeutic drug level monitoring: Secondary | ICD-10-CM | POA: Diagnosis not present

## 2022-09-19 DIAGNOSIS — I480 Paroxysmal atrial fibrillation: Secondary | ICD-10-CM | POA: Diagnosis not present

## 2022-09-19 LAB — POCT INR: POC INR: 3

## 2022-09-19 NOTE — Patient Instructions (Signed)
Description   Continue taking warfarin 1 tablet daily except for 2 tablets on Mondays.  Recheck INR 4 weeks [facility]in Clinic [phone]

## 2022-10-09 DIAGNOSIS — E039 Hypothyroidism, unspecified: Secondary | ICD-10-CM | POA: Diagnosis not present

## 2022-10-09 DIAGNOSIS — M1990 Unspecified osteoarthritis, unspecified site: Secondary | ICD-10-CM | POA: Diagnosis not present

## 2022-10-09 DIAGNOSIS — I4891 Unspecified atrial fibrillation: Secondary | ICD-10-CM | POA: Diagnosis not present

## 2022-10-09 DIAGNOSIS — I480 Paroxysmal atrial fibrillation: Secondary | ICD-10-CM | POA: Diagnosis not present

## 2022-10-09 DIAGNOSIS — J45909 Unspecified asthma, uncomplicated: Secondary | ICD-10-CM | POA: Diagnosis not present

## 2022-10-09 DIAGNOSIS — N1831 Chronic kidney disease, stage 3a: Secondary | ICD-10-CM | POA: Diagnosis not present

## 2022-10-09 DIAGNOSIS — D649 Anemia, unspecified: Secondary | ICD-10-CM | POA: Diagnosis not present

## 2022-10-09 DIAGNOSIS — I10 Essential (primary) hypertension: Secondary | ICD-10-CM | POA: Diagnosis not present

## 2022-10-09 DIAGNOSIS — K219 Gastro-esophageal reflux disease without esophagitis: Secondary | ICD-10-CM | POA: Diagnosis not present

## 2022-10-18 ENCOUNTER — Ambulatory Visit: Payer: Medicare PPO | Attending: Cardiovascular Disease

## 2022-10-18 DIAGNOSIS — I480 Paroxysmal atrial fibrillation: Secondary | ICD-10-CM

## 2022-10-18 DIAGNOSIS — Z5181 Encounter for therapeutic drug level monitoring: Secondary | ICD-10-CM | POA: Diagnosis not present

## 2022-10-18 LAB — POCT INR: INR: 1.9 — AB (ref 2.0–3.0)

## 2022-10-18 NOTE — Patient Instructions (Addendum)
TAKE 1.5 TABLETS TODAY ONLY THEN Continue taking warfarin 1 tablet daily except for 2 tablets on Mondays.  Recheck INR 5 weeks [facility] Clinic [phone]

## 2022-10-31 ENCOUNTER — Other Ambulatory Visit: Payer: Self-pay | Admitting: Cardiology

## 2022-10-31 DIAGNOSIS — I480 Paroxysmal atrial fibrillation: Secondary | ICD-10-CM

## 2022-10-31 NOTE — Telephone Encounter (Signed)
Warfarin 5mg  refill Afib Last INR 10/18/22 Last OV 05/24/22

## 2022-11-07 ENCOUNTER — Other Ambulatory Visit: Payer: Self-pay | Admitting: Gastroenterology

## 2022-11-21 NOTE — Progress Notes (Signed)
Cardiology Office Note:   Date:  11/22/2022  ID:  [name], DOB 06-21-1945, MRN [id number] PCP: Irven Coe, MD  [facility] Providers Cardiologist:  Rollene Rotunda, MD {  History of Present Illness:   [name] is a 77 y.o. female  with a hx of hypertension, borderline hyperlipidemia, hypothyroidism and PAF on coumadin. She was in the ED in December 2017 for palpitation, EKG showed atrial fibrillation. She was not on systemic anticoagulation at the time. She eventually self converted to sinus rhythm on IV Lopressor. During the follow-up on 04/03/2016, she was agreeable to start on eliquis. However due to the inability to afford eliquis, her compliance was very limited. Therefore she was transitioned to Coumadin in February 2018.  Although she was initially hesitant to consider flecainide, she was eventually agreeable to be placed on flecainide therapy.  Last echocardiogram obtained on 04/18/2016 showed EF 60-65%, grade 2 DD. Myoview obtained on 05/22/2016 showed EF 62%, low risk study, no ischemia.  She returned to the ED in August 2018 with recurrent atrial fibrillation.  TSH and free T4 were normal.  During previous visit in April 2022, her beta-blocker was discontinued due to bradycardia.  Unfortunately she developed orthostatic dizziness despite discontinuation of her beta-blocker.  Dr. Antoine Poche recommended a thigh compression stocking. Heart monitor placed in 2022 showed no significant bradycardia that can contribute to her dizziness.    In August 2022 at which time he was still having occasional orthostatic dizziness when she suddenly changed body positions.  Myoview obtained on 05/24/2021 was low risk, no evidence of ischemia, EF 63%.   Since I last saw her she had right hemicolectomy; she had colon cancer.  She did okay with this.  She has not had any new cardiovascular complaints.  She is very limited bilateral knee pain and she has morbid obesity.  She is not getting the  orthostatic dizziness that she was describing.  She does get dyspneic with exertion and some sporadic chest discomfort but this is not different from 2023 when she had a negative perfusion study.  She feels the palpitations may be for less than 30 minutes sporadically.  She tolerates her anticoagulation.  ROS: As stated in the HPI and negative for all other systems.  Studies Reviewed:    EKG:   EKG Interpretation Date/Time:  Friday November 22 2022 07:48:29 EDT Ventricular Rate:  56 PR Interval:    QRS Duration:  92 QT Interval:  472 QTC Calculation: 455 R Axis:   18  Text Interpretation: Normal sinus rhythm Poor anterior R wave progression When compared with ECG of 13-Mar-2021 18:31, No significant change since last tracing Confirmed by Rollene Rotunda ([id number]) on 11/22/2022 7:54:05 AM     Risk Assessment/Calculations:    CHA2DS2-VASc Score = 4   This indicates a 4.8% annual risk of stroke. The patient's score is based upon: CHF History: 0 HTN History: 1 Diabetes History: 0 Stroke History: 0 Vascular Disease History: 0 Age Score: 2 Gender Score: 1     Physical Exam:   VS:  BP (!) 166/74 (BP Location: Left Arm, Patient Position: Sitting, Cuff Size: Large)   Pulse (!) 56   Ht 4' 11.5" (1.511 m)   Wt 222 lb 3.2 oz (100.8 kg)   SpO2 99%   BMI 44.13 kg/m    Wt Readings from Last 3 Encounters:  11/22/22 222 lb 3.2 oz (100.8 kg)  06/19/22 215 lb (97.5 kg)  05/24/22 216 lb 12.8 oz (98.3 kg)  GEN: Well nourished, well developed in no acute distress NECK: No JVD; No carotid bruits CARDIAC: RRR, no murmurs, rubs, gallops RESPIRATORY:  Clear to auscultation without rales, wheezing or rhonchi  ABDOMEN: Soft, non-tender, non-distended EXTREMITIES:  No edema; No deformity   ASSESSMENT AND PLAN:   Paroxysmal atrial fibrillation:    [name] has a CHA2DS2 - VASc score of 4.   She has short paroxysms.  No change in therapy.   Essential hypertension:  Her blood  pressure is her blood pressure is elevated.  I am going to add amlodipine 2.5 mg daily and she should keep a blood pressure diary.    Dizziness:   She is no longer really describing this as much as she was.  No change in therapy.  Insomnia:   She had a negative sleep study.  She has urinary frequency and can discuss this further with her primary.      Follow up with me in 1 year  Signed, Rollene Rotunda, MD

## 2022-11-22 ENCOUNTER — Encounter: Payer: Self-pay | Admitting: Cardiology

## 2022-11-22 ENCOUNTER — Ambulatory Visit: Payer: Medicare PPO | Attending: Cardiology | Admitting: Cardiology

## 2022-11-22 ENCOUNTER — Ambulatory Visit (INDEPENDENT_AMBULATORY_CARE_PROVIDER_SITE_OTHER): Payer: Medicare PPO | Admitting: *Deleted

## 2022-11-22 VITALS — BP 166/74 | HR 56 | Ht 59.5 in | Wt 222.2 lb

## 2022-11-22 DIAGNOSIS — R0602 Shortness of breath: Secondary | ICD-10-CM | POA: Diagnosis not present

## 2022-11-22 DIAGNOSIS — Z5181 Encounter for therapeutic drug level monitoring: Secondary | ICD-10-CM

## 2022-11-22 DIAGNOSIS — I480 Paroxysmal atrial fibrillation: Secondary | ICD-10-CM | POA: Diagnosis not present

## 2022-11-22 DIAGNOSIS — I10 Essential (primary) hypertension: Secondary | ICD-10-CM

## 2022-11-22 LAB — POCT INR: INR: 2 (ref 2.0–3.0)

## 2022-11-22 MED ORDER — AMLODIPINE BESYLATE 2.5 MG PO TABS: 2.5000 mg | ORAL_TABLET | Freq: Every day | ORAL | 3 refills | Status: DC

## 2022-11-22 NOTE — Patient Instructions (Addendum)
Medication Instructions:  Your physician has recommended you make the following change in your medication:   -Start amlodipine (norvasc) 2.5mg  once daily.  *If you need a refill on your cardiac medications before your next appointment, please call your pharmacy*     Follow-Up: At [facility], you and your health needs are our priority.  As part of our continuing mission to provide you with exceptional heart care, we have created designated Provider Care Teams.  These Care Teams include your primary Cardiologist (physician) and Advanced Practice Providers (APPs -  Physician Assistants and Nurse Practitioners) who all work together to provide you with the care you need, when you need it.  We recommend signing up for the patient portal called "MyChart".  Sign up information is provided on this After Visit Summary.  MyChart is used to connect with patients for Virtual Visits (Telemedicine).  Patients are able to view lab/test results, encounter notes, upcoming appointments, etc.  Non-urgent messages can be sent to your provider as well.   To learn more about what you can do with MyChart, go to [email or web address].    Your next appointment:   12 month(s)  Provider:   Rollene Rotunda, MD     Other Instructions Dr. Antoine Poche would like for you to keep a blood pressure log. Please take your blood pressure 2-3 times a week, about 1-2 hours after medications. Please send in your reading via mychart in about 6-8 weeks.

## 2022-11-22 NOTE — Patient Instructions (Addendum)
Description   TAKE 2 TABLETS TODAY ONLY THEN Continue taking warfarin 1 tablet daily except for 2 tablets on Mondays. Recheck INR 6 weeks Coumadin Clinic [phone]

## 2022-11-27 ENCOUNTER — Encounter: Payer: Self-pay | Admitting: *Deleted

## 2022-11-27 ENCOUNTER — Telehealth: Payer: Self-pay | Admitting: Cardiology

## 2022-11-27 NOTE — Telephone Encounter (Signed)
Pt c/o medication issue:  1. Name of Medication: amLODIPine Besylate    2. How are you currently taking this medication (dosage and times per day)? As prescribed, but hasn't taken one today.   3. Are you having a reaction (difficulty breathing--STAT)? Yes   4. What is your medication issue? Patient is calling because she says every time she takes this medication she feels itchy and her mouth gets slightly swollen. Please advise.

## 2022-11-27 NOTE — Telephone Encounter (Signed)
Patient states she started taking amlodipine Sunday. After taking amlodipine her her throat feels itchy and her mouth gets slightly swollen.  She has not taken amlodipine today. I advised to not take any more of the amlodipine. She states she was going to take benadryl. ED precautions discussed.

## 2022-11-28 NOTE — Telephone Encounter (Signed)
Pt returning call, she states she is feeling better now and no itching.

## 2022-11-28 NOTE — Telephone Encounter (Signed)
Spoke with patient and she states she is doing better than she was yesterday after taking benadryl. She did not take amlodipine today

## 2022-11-28 NOTE — Telephone Encounter (Signed)
Patient will discontinue amlodipine and start monitoring her BP for 2 weeks. She will call us with the readings. She verbalized understanding.

## 2022-11-28 NOTE — Telephone Encounter (Signed)
Allergy listed on 8/21

## 2022-11-28 NOTE — Telephone Encounter (Signed)
Left voicemail to return call to office.

## 2022-12-04 DIAGNOSIS — H5203 Hypermetropia, bilateral: Secondary | ICD-10-CM | POA: Diagnosis not present

## 2022-12-05 ENCOUNTER — Other Ambulatory Visit: Payer: Self-pay | Admitting: Gastroenterology

## 2022-12-11 ENCOUNTER — Telehealth: Payer: Self-pay | Admitting: Cardiology

## 2022-12-11 NOTE — Telephone Encounter (Signed)
Returned pt call as seen below. Pt verbalized understanding.  Per Dr. Antoine Poche- These fluctuate a little bit but they are reasonable.  No change in therapy.

## 2022-12-11 NOTE — Telephone Encounter (Signed)
Patient confirms these are after stopping the amlodipine due to the itching.  The itching has resolved after discontinuing.  She has no C/O headache, dizziness, SOB or any other symptoms with these readings. Please advise

## 2022-12-11 NOTE — Telephone Encounter (Signed)
Patient calling with bp readings  8/22 5:18 am 156/66 HR 61 8/23 8:30 am 150/62 HR 59 8/24 9:20 am 145/61 HR 58 8/25 9:22 am 131/55 HR 56 8/26 10:19 am 132/58 HR 62 8/27 8:16 am 140/67 HR 59 8/28 8:36 am 138/62 HR 58 8/29 10:20 am 126//52 HR 53 8/30 8:30 am 141/59 HR 50 8/31 8:50 am 142/60 HR 55 9/01 12:52 pm 130/53 HR 56 9/02 9:18 am 129/56 HR 70 9/03 9:28 am 121/47 HR 57 9/04 9:05 am 136/50 HR 50  Please advise

## 2022-12-21 DIAGNOSIS — Z23 Encounter for immunization: Secondary | ICD-10-CM | POA: Diagnosis not present

## 2023-01-03 ENCOUNTER — Ambulatory Visit: Payer: Medicare PPO

## 2023-01-07 ENCOUNTER — Ambulatory Visit: Payer: Medicare PPO | Attending: Cardiology | Admitting: *Deleted

## 2023-01-07 DIAGNOSIS — I480 Paroxysmal atrial fibrillation: Secondary | ICD-10-CM | POA: Diagnosis not present

## 2023-01-07 DIAGNOSIS — Z5181 Encounter for therapeutic drug level monitoring: Secondary | ICD-10-CM | POA: Diagnosis not present

## 2023-01-07 LAB — POCT INR: INR: 2.4 (ref 2.0–3.0)

## 2023-01-07 NOTE — Patient Instructions (Signed)
Description   Continue taking warfarin 1 tablet daily except for 2 tablets on Mondays. Recheck INR 6 weeks Coumadin Clinic [phone]

## 2023-02-18 ENCOUNTER — Ambulatory Visit: Payer: Medicare PPO | Attending: Cardiology | Admitting: *Deleted

## 2023-02-18 DIAGNOSIS — Z5181 Encounter for therapeutic drug level monitoring: Secondary | ICD-10-CM

## 2023-02-18 DIAGNOSIS — I480 Paroxysmal atrial fibrillation: Secondary | ICD-10-CM

## 2023-02-18 LAB — POCT INR: INR: 2.5 (ref 2.0–3.0)

## 2023-02-18 NOTE — Patient Instructions (Signed)
Description   Continue taking warfarin 1 tablet daily except for 2 tablets on Mondays. Recheck INR 7 weeks Coumadin Clinic [phone]

## 2023-03-30 ENCOUNTER — Other Ambulatory Visit: Payer: Self-pay | Admitting: Gastroenterology

## 2023-04-08 ENCOUNTER — Ambulatory Visit: Payer: Medicare PPO | Attending: Cardiovascular Disease | Admitting: *Deleted

## 2023-04-08 DIAGNOSIS — Z5181 Encounter for therapeutic drug level monitoring: Secondary | ICD-10-CM | POA: Diagnosis not present

## 2023-04-08 DIAGNOSIS — I480 Paroxysmal atrial fibrillation: Secondary | ICD-10-CM

## 2023-04-08 LAB — POCT INR: INR: 2.9 (ref 2.0–3.0)

## 2023-04-08 NOTE — Patient Instructions (Signed)
 Description   Continue taking warfarin 1 tablet daily except for 2 tablets on Mondays. Recheck INR 8 weeks Coumadin Clinic [phone]

## 2023-04-22 ENCOUNTER — Other Ambulatory Visit: Payer: Self-pay | Admitting: Cardiology

## 2023-04-22 DIAGNOSIS — I480 Paroxysmal atrial fibrillation: Secondary | ICD-10-CM

## 2023-04-22 NOTE — Telephone Encounter (Signed)
 Warfarin 5mg  refill Afib Last INR 04/08/23 Last OV 11/22/22

## 2023-05-02 DIAGNOSIS — I10 Essential (primary) hypertension: Secondary | ICD-10-CM | POA: Diagnosis not present

## 2023-05-02 DIAGNOSIS — E039 Hypothyroidism, unspecified: Secondary | ICD-10-CM | POA: Diagnosis not present

## 2023-05-02 DIAGNOSIS — N1831 Chronic kidney disease, stage 3a: Secondary | ICD-10-CM | POA: Diagnosis not present

## 2023-05-02 DIAGNOSIS — I4891 Unspecified atrial fibrillation: Secondary | ICD-10-CM | POA: Diagnosis not present

## 2023-05-02 DIAGNOSIS — R7303 Prediabetes: Secondary | ICD-10-CM | POA: Diagnosis not present

## 2023-05-06 ENCOUNTER — Other Ambulatory Visit: Payer: Self-pay | Admitting: Family Medicine

## 2023-05-06 DIAGNOSIS — K219 Gastro-esophageal reflux disease without esophagitis: Secondary | ICD-10-CM | POA: Diagnosis not present

## 2023-05-06 DIAGNOSIS — J45909 Unspecified asthma, uncomplicated: Secondary | ICD-10-CM | POA: Diagnosis not present

## 2023-05-06 DIAGNOSIS — I10 Essential (primary) hypertension: Secondary | ICD-10-CM | POA: Diagnosis not present

## 2023-05-06 DIAGNOSIS — Z1231 Encounter for screening mammogram for malignant neoplasm of breast: Secondary | ICD-10-CM

## 2023-05-06 DIAGNOSIS — N1831 Chronic kidney disease, stage 3a: Secondary | ICD-10-CM | POA: Diagnosis not present

## 2023-05-06 DIAGNOSIS — M1990 Unspecified osteoarthritis, unspecified site: Secondary | ICD-10-CM | POA: Diagnosis not present

## 2023-05-06 DIAGNOSIS — E039 Hypothyroidism, unspecified: Secondary | ICD-10-CM | POA: Diagnosis not present

## 2023-05-06 DIAGNOSIS — I4891 Unspecified atrial fibrillation: Secondary | ICD-10-CM | POA: Diagnosis not present

## 2023-05-06 DIAGNOSIS — Z0000 Encounter for general adult medical examination without abnormal findings: Secondary | ICD-10-CM | POA: Diagnosis not present

## 2023-05-06 DIAGNOSIS — M8588 Other specified disorders of bone density and structure, other site: Secondary | ICD-10-CM

## 2023-06-03 ENCOUNTER — Ambulatory Visit: Payer: Medicare PPO | Attending: Cardiology

## 2023-06-03 DIAGNOSIS — Z5181 Encounter for therapeutic drug level monitoring: Secondary | ICD-10-CM

## 2023-06-03 DIAGNOSIS — I480 Paroxysmal atrial fibrillation: Secondary | ICD-10-CM | POA: Diagnosis not present

## 2023-06-03 LAB — POCT INR: INR: 2.8 (ref 2.0–3.0)

## 2023-06-03 NOTE — Patient Instructions (Signed)
 Continue taking warfarin 1 tablet daily except for 2 tablets on Mondays. Recheck INR 8 weeks Coumadin Clinic [phone]

## 2023-06-23 ENCOUNTER — Ambulatory Visit
Admission: RE | Admit: 2023-06-23 | Discharge: 2023-06-23 | Disposition: A | Discharge status: Home / Self Care | Payer: Medicare PPO | Source: Ambulatory Visit | Attending: Family Medicine | Admitting: Family Medicine

## 2023-06-23 DIAGNOSIS — Z1231 Encounter for screening mammogram for malignant neoplasm of breast: Secondary | ICD-10-CM

## 2023-07-19 ENCOUNTER — Other Ambulatory Visit: Payer: Self-pay | Admitting: Cardiology

## 2023-07-19 DIAGNOSIS — I480 Paroxysmal atrial fibrillation: Secondary | ICD-10-CM

## 2023-07-24 DIAGNOSIS — Z7901 Long term (current) use of anticoagulants: Secondary | ICD-10-CM | POA: Diagnosis not present

## 2023-07-24 DIAGNOSIS — I4891 Unspecified atrial fibrillation: Secondary | ICD-10-CM | POA: Diagnosis not present

## 2023-07-24 DIAGNOSIS — E785 Hyperlipidemia, unspecified: Secondary | ICD-10-CM | POA: Diagnosis not present

## 2023-07-24 DIAGNOSIS — D6869 Other thrombophilia: Secondary | ICD-10-CM | POA: Diagnosis not present

## 2023-07-24 DIAGNOSIS — I2510 Atherosclerotic heart disease of native coronary artery without angina pectoris: Secondary | ICD-10-CM | POA: Diagnosis not present

## 2023-07-24 DIAGNOSIS — I10 Essential (primary) hypertension: Secondary | ICD-10-CM | POA: Diagnosis not present

## 2023-07-24 DIAGNOSIS — I700 Atherosclerosis of aorta: Secondary | ICD-10-CM | POA: Diagnosis not present

## 2023-07-24 DIAGNOSIS — M1990 Unspecified osteoarthritis, unspecified site: Secondary | ICD-10-CM | POA: Diagnosis not present

## 2023-07-29 ENCOUNTER — Ambulatory Visit: Payer: Medicare PPO | Attending: Cardiology

## 2023-07-29 DIAGNOSIS — Z5181 Encounter for therapeutic drug level monitoring: Secondary | ICD-10-CM

## 2023-07-29 DIAGNOSIS — I480 Paroxysmal atrial fibrillation: Secondary | ICD-10-CM

## 2023-07-29 LAB — POCT INR: INR: 2.1 (ref 2.0–3.0)

## 2023-07-29 NOTE — Patient Instructions (Signed)
 Continue taking warfarin 1 tablet daily except for 2 tablets on Mondays. Recheck INR 8 weeks Coumadin Clinic [phone]

## 2023-08-01 ENCOUNTER — Other Ambulatory Visit: Payer: Self-pay | Admitting: Physician Assistant

## 2023-09-13 ENCOUNTER — Encounter (HOSPITAL_COMMUNITY): Payer: Self-pay

## 2023-09-13 ENCOUNTER — Other Ambulatory Visit: Payer: Self-pay

## 2023-09-13 ENCOUNTER — Emergency Department (HOSPITAL_COMMUNITY)

## 2023-09-13 ENCOUNTER — Emergency Department (HOSPITAL_COMMUNITY)
Admission: EM | Admit: 2023-09-13 | Discharge: 2023-09-13 | Disposition: A | Discharge status: Home / Self Care | Attending: Emergency Medicine | Admitting: Emergency Medicine

## 2023-09-13 DIAGNOSIS — R0782 Intercostal pain: Secondary | ICD-10-CM | POA: Diagnosis not present

## 2023-09-13 DIAGNOSIS — R079 Chest pain, unspecified: Secondary | ICD-10-CM | POA: Diagnosis not present

## 2023-09-13 DIAGNOSIS — Z7901 Long term (current) use of anticoagulants: Secondary | ICD-10-CM | POA: Diagnosis not present

## 2023-09-13 DIAGNOSIS — R0602 Shortness of breath: Secondary | ICD-10-CM | POA: Diagnosis not present

## 2023-09-13 LAB — CBC
HCT: 37.1 % (ref 36.0–46.0)
Hemoglobin: 11.8 g/dL — ABNORMAL LOW (ref 12.0–15.0)
MCH: 29.6 pg (ref 26.0–34.0)
MCHC: 31.8 g/dL (ref 30.0–36.0)
MCV: 93 fL (ref 80.0–100.0)
Platelets: 217 10*3/uL (ref 150–400)
RBC: 3.99 MIL/uL (ref 3.87–5.11)
RDW: 14 % (ref 11.5–15.5)
WBC: 8.1 10*3/uL (ref 4.0–10.5)
nRBC: 0 % (ref 0.0–0.2)

## 2023-09-13 LAB — BASIC METABOLIC PANEL WITH GFR
Anion gap: 9 (ref 5–15)
BUN: 15 mg/dL (ref 8–23)
CO2: 27 mmol/L (ref 22–32)
Calcium: 9.1 mg/dL (ref 8.9–10.3)
Chloride: 105 mmol/L (ref 98–111)
Creatinine, Ser: 1.25 mg/dL — ABNORMAL HIGH (ref 0.44–1.00)
GFR, Estimated: 45 mL/min — ABNORMAL LOW (ref >60)
Glucose, Bld: 102 mg/dL — ABNORMAL HIGH (ref 70–99)
Potassium: 3.7 mmol/L (ref 3.5–5.1)
Sodium: 141 mmol/L (ref 135–145)

## 2023-09-13 LAB — TROPONIN I (HIGH SENSITIVITY)
Troponin I (High Sensitivity): 7 ng/L (ref <18)
Troponin I (High Sensitivity): 7 ng/L (ref <18)

## 2023-09-13 LAB — D-DIMER, QUANTITATIVE: D-Dimer, Quant: <0.27 ug{FEU}/mL (ref 0.00–0.50)

## 2023-09-13 MED ORDER — GUAIFENESIN 100 MG/5ML PO LIQD: 5.0000 mL | ORAL | Status: DC | PRN

## 2023-09-13 NOTE — ED Triage Notes (Addendum)
 Pt arrived POV from home c/o left sided CP that started yesterday and gets worse when she takes a deep breath. Pt also endorses SHOB and dizziness with the pain. Pt does state the pain radiates into her back and left shoulder.

## 2023-09-13 NOTE — Discharge Instructions (Signed)
 The test today in the ED did not show any signs of heart attack or blood clot in the lungs.  Take over-the-counter medications as needed for pain and discomfort.  Follow-up with your doctor next week to be rechecked.  Return to the ED for worsening symptoms.

## 2023-09-13 NOTE — ED Provider Notes (Signed)
 [facility] DEPARTMENT AT [facility] Provider Note   CSN: [id number] Arrival date & time: 09/13/23  4098     History  Chief Complaint  Patient presents with   Chest Pain    [name] is a 78 y.o. female.   Chest Pain    Pt started having pain in her chest yesterday.  It is a stabbing pain in the chest on the left side.  It gets worse with deep breathing.   No fevers or cough.  No new leg swelling.  No hx of sx like this in the past.  Home Medications Prior to Admission medications   Medication Sig Start Date End Date Taking? Authorizing Provider  amLODipine  (NORVASC ) 2.5 MG tablet Take 1 tablet (2.5 mg total) by mouth daily. 11/22/22   Eilleen Grates, MD  dicyclomine  (BENTYL ) 10 MG capsule Take 1 capsule (10 mg total) by mouth 2 (two) times daily before a meal. Patient not taking: Reported on 11/22/2022 04/10/21   Albertina Hugger, MD  flecainide  (TAMBOCOR ) 100 MG tablet Take 1 tablet by mouth twice daily 08/01/23   Eilleen Grates, MD  metoCLOPramide  (REGLAN ) 5 MG tablet Take one tablet before each bowel prep Patient not taking: Reported on 11/22/2022 05/22/22   Albertina Hugger, MD  omeprazole (PRILOSEC) 10 MG capsule Take 10 mg by mouth daily as needed (acid reflux).    [provider]  ursodiol  (ACTIGALL ) 300 MG capsule Take 2 capsules by mouth once daily 03/30/23   Albertina Hugger, MD  warfarin (COUMADIN ) 5 MG tablet TAKE 1 TO 2 TABLETS BY MOUTH ONCE DAILY OR AS DIRECTED BY THE COUMADIN  CLINIC 07/21/23   Eilleen Grates, MD      Allergies    Chocolate, Amlodipine , Beef-derived drug products, Cat dander, Erythromycin, Fish allergy , Phenergan [promethazine], Pork-derived products, Shellfish allergy , Soybean extract allergy  skin test, Strawberry extract, Naproxen sodium, and Penicillins    Review of Systems   Review of Systems  Cardiovascular:  Positive for chest pain.    Physical Exam Updated Vital Signs BP (!) 166/65 (BP Location:  Right Arm)   Pulse 60   Temp 97.9 F (36.6 C)   Resp 17   Ht 1.511 m (4' 11.5")   Wt 100.7 kg   SpO2 95%   BMI 44.09 kg/m  Physical Exam Vitals and nursing note reviewed.  Constitutional:      General: She is not in acute distress.    Appearance: She is well-developed.  HENT:     Head: Normocephalic and atraumatic.     Right Ear: External ear normal.     Left Ear: External ear normal.  Eyes:     General: No scleral icterus.       Right eye: No discharge.        Left eye: No discharge.     Conjunctiva/sclera: Conjunctivae normal.  Neck:     Trachea: No tracheal deviation.  Cardiovascular:     Rate and Rhythm: Normal rate and regular rhythm.  Pulmonary:     Effort: Pulmonary effort is normal. No respiratory distress.     Breath sounds: Normal breath sounds. No stridor. No wheezing or rales.  Abdominal:     General: Bowel sounds are normal. There is no distension.     Palpations: Abdomen is soft.     Tenderness: There is no abdominal tenderness. There is no guarding or rebound.  Musculoskeletal:        General: No tenderness  or deformity.     Cervical back: Neck supple.  Skin:    General: Skin is warm and dry.     Findings: No rash.     Comments: No rash noted on the left chest wall  Neurological:     General: No focal deficit present.     Mental Status: She is alert.     Cranial Nerves: No cranial nerve deficit, dysarthria or facial asymmetry.     Sensory: No sensory deficit.     Motor: No abnormal muscle tone or seizure activity.     Coordination: Coordination normal.  Psychiatric:        Mood and Affect: Mood normal.     ED Results / Procedures / Treatments   Labs (all labs ordered are listed, but only abnormal results are displayed) Labs Reviewed  BASIC METABOLIC PANEL WITH GFR - Abnormal; Notable for the following components:      Result Value   Glucose, Bld 102 (*)    Creatinine, Ser 1.25 (*)    GFR, Estimated 45 (*)    All other components within  normal limits  CBC - Abnormal; Notable for the following components:   Hemoglobin 11.8 (*)    All other components within normal limits  D-DIMER, QUANTITATIVE  TROPONIN I (HIGH SENSITIVITY)  TROPONIN I (HIGH SENSITIVITY)    EKG EKG Interpretation Date/Time:  Saturday September 13 2023 09:59:25 EDT Ventricular Rate:  59 PR Interval:  210 QRS Duration:  80 QT Interval:  450 QTC Calculation: 445 R Axis:   -13  Text Interpretation: Sinus bradycardia with 1st degree A-V block Anteroseptal infarct , age undetermined Abnormal ECG When compared with ECG of 22-Nov-2022 07:48, PREVIOUS ECG IS PRESENT Confirmed by Jerald Molly [phone]) on 09/13/2023 12:03:28 PM  Radiology DG Chest 2 View Result Date: 09/13/2023 CLINICAL DATA:  Chest pain. EXAM: CHEST - 2 VIEW COMPARISON:  03/13/2021. FINDINGS: Bilateral lung fields are clear. Bilateral costophrenic angles are clear. Normal cardio-mediastinal silhouette. No acute osseous abnormalities. The soft tissues are within normal limits. IMPRESSION: No active cardiopulmonary disease. Electronically Signed   By: Beula Brunswick M.D.   On: 09/13/2023 10:56    Procedures Procedures    Medications Ordered in ED Medications - No data to display  ED Course/ Medical Decision Making/ A&P Clinical Course as of 09/13/23 1439  Sat Sep 13, 2023  1249 DG Chest 2 View Cxr normal [JK]  1250 CBC(!) nl [JK]  1250 Troponin I (High Sensitivity) nl [JK]  1414 D-dimer, quantitative D-dimer normal [JK]  1425 Troponin I (High Sensitivity) Delta troponin normal [JK]    Clinical Course User Index [JK] Trish Furl, MD                                 Medical Decision Making Problems Addressed: Intercostal pain: acute illness or injury that poses a threat to life or bodily functions  Amount and/or Complexity of Data Reviewed Labs: ordered. Decision-making details documented in ED Course. Radiology: ordered and independent interpretation performed. Decision-making  details documented in ED Course.   Patient presented to the ED for evaluation of chest pain.  ED workup reassuring.  Patient without signs of ischemia on EKG.  Her serial troponins are normal.  Low suspicion for ACS at this time.  Considered the possibility of PE with her pleuritic type chest pain.  Her x-ray did not show signs of pneumonia or pneumothorax.  Patient overall low  risk and D-dimer is negative.  I doubt pulmonary embolism.  Symptoms may be musculoskeletal in nature.  Patient did not want anything for pain.  At this point her ED workup is reassuring and she appears appropriate for symptomatic management outpatient follow-up.        Final Clinical Impression(s) / ED Diagnoses Final diagnoses:  Intercostal pain    Rx / DC Orders ED Discharge Orders     None         Trish Furl, MD 09/13/23 1439

## 2023-09-16 ENCOUNTER — Other Ambulatory Visit: Payer: Self-pay | Admitting: Cardiology

## 2023-09-16 DIAGNOSIS — I480 Paroxysmal atrial fibrillation: Secondary | ICD-10-CM

## 2023-09-23 ENCOUNTER — Ambulatory Visit: Attending: Cardiology

## 2023-09-23 DIAGNOSIS — Z5181 Encounter for therapeutic drug level monitoring: Secondary | ICD-10-CM

## 2023-09-23 DIAGNOSIS — I480 Paroxysmal atrial fibrillation: Secondary | ICD-10-CM

## 2023-09-23 LAB — POCT INR: INR: 2.1 (ref 2.0–3.0)

## 2023-09-23 NOTE — Patient Instructions (Signed)
 Continue taking warfarin 1 tablet daily except for 2 tablets on Mondays. Recheck INR 8 weeks Coumadin Clinic [phone]

## 2023-10-25 ENCOUNTER — Other Ambulatory Visit: Payer: Self-pay | Admitting: Gastroenterology

## 2023-11-11 ENCOUNTER — Other Ambulatory Visit: Payer: Self-pay | Admitting: Cardiology

## 2023-11-11 DIAGNOSIS — I480 Paroxysmal atrial fibrillation: Secondary | ICD-10-CM

## 2023-11-11 NOTE — Telephone Encounter (Signed)
 Warfarin 5mg  refill Afib Last INR 09/23/23 Last OV 05/24/22 and pending appt on 11/24/23

## 2023-11-18 ENCOUNTER — Ambulatory Visit: Attending: Cardiology

## 2023-11-18 DIAGNOSIS — I480 Paroxysmal atrial fibrillation: Secondary | ICD-10-CM

## 2023-11-18 DIAGNOSIS — Z5181 Encounter for therapeutic drug level monitoring: Secondary | ICD-10-CM

## 2023-11-18 LAB — POCT INR: INR: 2 (ref 2.0–3.0)

## 2023-11-18 NOTE — Patient Instructions (Signed)
 Continue taking warfarin 1 tablet daily except for 2 tablets on Mondays. Recheck INR 8 weeks Coumadin  Clinic [phone]

## 2023-11-18 NOTE — Progress Notes (Signed)
 INR 2.0 Please see anticoagulation encounter.

## 2023-11-21 DIAGNOSIS — R7303 Prediabetes: Secondary | ICD-10-CM | POA: Diagnosis not present

## 2023-11-21 DIAGNOSIS — I4891 Unspecified atrial fibrillation: Secondary | ICD-10-CM | POA: Diagnosis not present

## 2023-11-21 DIAGNOSIS — I10 Essential (primary) hypertension: Secondary | ICD-10-CM | POA: Diagnosis not present

## 2023-11-21 DIAGNOSIS — E039 Hypothyroidism, unspecified: Secondary | ICD-10-CM | POA: Diagnosis not present

## 2023-11-21 NOTE — Progress Notes (Unsigned)
 Cardiology Office Note:   Date:  11/24/2023  ID:  JIZEL [name], DOB [birth year], MRN [id number] PCP: Leonel Cole, MD  [facility] Providers Cardiologist:  Lynwood Schilling, MD {  History of Present Illness:   [name] is a 78 y.o. female with a hx of hypertension, borderline hyperlipidemia, hypothyroidism and PAF on coumadin . She was in the ED in December 2017 for palpitation, EKG showed atrial fibrillation. She was not on systemic anticoagulation at the time. She eventually self converted to sinus rhythm on IV Lopressor . During the follow-up on 04/03/2016, she was agreeable to start on eliquis . However due to the inability to afford eliquis , her compliance was very limited. Therefore she was transitioned to Coumadin  in February 2018.  Although she was initially hesitant to consider flecainide , she was eventually agreeable to be placed on flecainide  therapy.  Echocardiogram obtained on 04/18/2016 showed EF 60-65%, grade 2 DD. Myoview  obtained on 05/22/2016 showed EF 62%, low risk study, no ischemia.  She returned to the ED in August 2018 with recurrent atrial fibrillation.  TSH and free T4 were normal.  During previous visit in April 2022, her beta-blocker was discontinued due to bradycardia.  Unfortunately she developed orthostatic dizziness despite discontinuation of her beta-blocker.  Heart monitor placed in 2022 showed no significant bradycardia that can contribute to her dizziness. Myoview  obtained on 05/24/2021 was low risk, no evidence of ischemia, EF 63%. She had colon cancer.    Since I last saw her she was in the hospital in June with pain in her chest that was thought to be musculoskeletal.  I reviewed these records for this visit.   There was no suggestion of ischemia.   She has had no further chest pain.  Since I last saw her she has done well.  The patient denies any new symptoms such as chest discomfort, neck or arm discomfort. There has been no new shortness of breath, PND or  orthopnea. There have been no reported palpitations, presyncope or syncope.  She thinks that she gets occasional paroxysms of paroxysms of fib but overall is not bothered by palpitations.  She is not having any problems taking her medications.  Her biggest limitation is musculoskeletal and she ambulates with a walker.  She needs her knees replaced but this is not going to happen because of her weight.  ROS:   As stated in the HPI and negative for all other systems.  Studies Reviewed:    EKG:     June 2025 sinus rhythm rate 70, poor anterior R wave progression, no acute ST-T wave changes, intervals including QRS are normal.  Risk Assessment/Calculations:    CHA2DS2-VASc Score = 4   This indicates a 4.8% annual risk of stroke. The patient's score is based upon: CHF History: 0 HTN History: 1 Diabetes History: 0 Stroke History: 0 Vascular Disease History: 0 Age Score: 2 Gender Score: 1     Physical Exam:   VS:  BP (!) 140/70   Pulse 95   Ht 4' 11.5 (1.511 m)   Wt 222 lb (100.7 kg)   SpO2 96%   BMI 44.09 kg/m    Wt Readings from Last 3 Encounters:  11/24/23 222 lb (100.7 kg)  09/13/23 222 lb (100.7 kg)  11/22/22 222 lb 3.2 oz (100.8 kg)     GEN: Well nourished, well developed in no acute distress NECK: No JVD; No carotid bruits CARDIAC: RRR, no murmurs, rubs, gallops RESPIRATORY:  Clear to auscultation without rales, wheezing or  rhonchi  ABDOMEN: Soft, non-tender, non-distended EXTREMITIES:  No edema; No deformity   ASSESSMENT AND PLAN:   Paroxysmal atrial fibrillation:      [name] has a CHA2DS2 - VASc score of 4.   She has short paroxysms.  No change in therapy.    Essential hypertension:  Her blood pressure is upper limits of normal.  Notes she stopped taking her pill because it made her red.  She is going to keep a blood pressure diary.  She might need further med titration.  She understands goals of therapy.  Dizziness:   She is not really bothered by  this.  No change in therapy.  Follow up with me in 18 months.   Signed, Lynwood Schilling, MD

## 2023-11-22 ENCOUNTER — Other Ambulatory Visit: Payer: Self-pay | Admitting: Gastroenterology

## 2023-11-24 ENCOUNTER — Ambulatory Visit: Attending: Cardiology | Admitting: Cardiology

## 2023-11-24 ENCOUNTER — Encounter: Payer: Self-pay | Admitting: Cardiology

## 2023-11-24 VITALS — BP 140/70 | HR 95 | Ht 59.5 in | Wt 222.0 lb

## 2023-11-24 DIAGNOSIS — I10 Essential (primary) hypertension: Secondary | ICD-10-CM

## 2023-11-24 DIAGNOSIS — I480 Paroxysmal atrial fibrillation: Secondary | ICD-10-CM

## 2023-11-24 NOTE — Patient Instructions (Signed)
 Medication Instructions:  Your physician recommends that you continue on your current medications as directed. Please refer to the Current Medication list given to you today.  *If you need a refill on your cardiac medications before your next appointment, please call your pharmacy*  Lab Work: NONE If you have labs (blood work) drawn today and your tests are completely normal, you will receive your results only by: MyChart Message (if you have MyChart) OR A paper copy in the mail If you have any lab test that is abnormal or we need to change your treatment, we will call you to review the results.  Testing/Procedures: NONE  Follow-Up: At [facility], you and your health needs are our priority.  As part of our continuing mission to provide you with exceptional heart care, our providers are all part of one team.  This team includes your primary Cardiologist (physician) and Advanced Practice Providers or APPs (Physician Assistants and Nurse Practitioners) who all work together to provide you with the care you need, when you need it.  Your next appointment:   18 months  Provider:   Lavona, MD  We recommend signing up for the patient portal called MyChart.  Sign up information is provided on this After Visit Summary.  MyChart is used to connect with patients for Virtual Visits (Telemedicine).  Patients are able to view lab/test results, encounter notes, upcoming appointments, etc.  Non-urgent messages can be sent to your provider as well.   To learn more about what you can do with MyChart, go to [email or web address].   Other Instructions Blood pressure diary:Please take your blood pressure twice daily and record your readings. Send them to us  via MyChart.

## 2023-11-25 DIAGNOSIS — E039 Hypothyroidism, unspecified: Secondary | ICD-10-CM | POA: Diagnosis not present

## 2023-11-25 DIAGNOSIS — Z85038 Personal history of other malignant neoplasm of large intestine: Secondary | ICD-10-CM | POA: Diagnosis not present

## 2023-11-25 DIAGNOSIS — I4891 Unspecified atrial fibrillation: Secondary | ICD-10-CM | POA: Diagnosis not present

## 2023-11-25 DIAGNOSIS — R7303 Prediabetes: Secondary | ICD-10-CM | POA: Diagnosis not present

## 2023-11-25 DIAGNOSIS — N1831 Chronic kidney disease, stage 3a: Secondary | ICD-10-CM | POA: Diagnosis not present

## 2023-11-25 DIAGNOSIS — K219 Gastro-esophageal reflux disease without esophagitis: Secondary | ICD-10-CM | POA: Diagnosis not present

## 2023-11-25 DIAGNOSIS — I10 Essential (primary) hypertension: Secondary | ICD-10-CM | POA: Diagnosis not present

## 2023-12-03 DIAGNOSIS — R0982 Postnasal drip: Secondary | ICD-10-CM | POA: Diagnosis not present

## 2023-12-03 DIAGNOSIS — H7291 Unspecified perforation of tympanic membrane, right ear: Secondary | ICD-10-CM | POA: Diagnosis not present

## 2023-12-03 DIAGNOSIS — H93291 Other abnormal auditory perceptions, right ear: Secondary | ICD-10-CM | POA: Diagnosis not present

## 2023-12-03 DIAGNOSIS — R040 Epistaxis: Secondary | ICD-10-CM | POA: Diagnosis not present

## 2023-12-03 DIAGNOSIS — H6123 Impacted cerumen, bilateral: Secondary | ICD-10-CM | POA: Diagnosis not present

## 2023-12-04 DIAGNOSIS — H52223 Regular astigmatism, bilateral: Secondary | ICD-10-CM | POA: Diagnosis not present

## 2023-12-05 ENCOUNTER — Telehealth: Payer: Self-pay | Admitting: Cardiology

## 2023-12-05 NOTE — Telephone Encounter (Signed)
 Pt called in to report BP readings after 10 day. Reports she is not having any symptoms.   8/19: AM 139/62 hr 60           PM 131/59 hr 56  8/20: AM 139/67 hr 70          PM 123/53 hr 52  8/21: AM 124/53 hr 74          PM 136/76 hr 53   8/22: AM 133/69 hr 62          PM 133/50 hr 69  8/23: AM 134/66 hr 61          PM 124/42 hr 51  8/24: AM 133/70 hr 64          PM 140/69 hr 65  8/25: AM 140/66 hr 63          PM 141/56 hr 50  8/26: AM 133/60 hr 65          PM 138/55 hr 55  8/27: AM 146/61 hr 56         PM 124/48 hr 57  8/28: AM 138/54 hr 54           PM 130/69 hr 51

## 2023-12-05 NOTE — Telephone Encounter (Signed)
 Called and spoke with patient reagrding her BP readings and she voiced understanding.

## 2023-12-20 DIAGNOSIS — Z23 Encounter for immunization: Secondary | ICD-10-CM | POA: Diagnosis not present

## 2023-12-23 ENCOUNTER — Other Ambulatory Visit: Payer: Medicare PPO

## 2023-12-29 ENCOUNTER — Telehealth: Payer: Self-pay | Admitting: Gastroenterology

## 2023-12-29 ENCOUNTER — Other Ambulatory Visit: Payer: Self-pay

## 2023-12-29 MED ORDER — URSODIOL 300 MG PO CAPS: 600.0000 mg | ORAL_CAPSULE | Freq: Every day | ORAL | 0 refills | Status: DC

## 2023-12-29 NOTE — Telephone Encounter (Signed)
 Received a call from patient stating she needs her Ursodiol  prescription (300 mg) refilled. Please review and advise   Thank you

## 2023-12-29 NOTE — Telephone Encounter (Signed)
Rx refilled and pt informed.  

## 2024-01-13 ENCOUNTER — Other Ambulatory Visit: Payer: Self-pay | Admitting: Cardiology

## 2024-01-13 ENCOUNTER — Ambulatory Visit: Attending: Cardiology | Admitting: *Deleted

## 2024-01-13 DIAGNOSIS — I480 Paroxysmal atrial fibrillation: Secondary | ICD-10-CM | POA: Diagnosis not present

## 2024-01-13 DIAGNOSIS — Z5181 Encounter for therapeutic drug level monitoring: Secondary | ICD-10-CM

## 2024-01-13 LAB — POCT INR: INR: 2.2 (ref 2.0–3.0)

## 2024-01-13 MED ORDER — FLECAINIDE ACETATE 100 MG PO TABS: 100.0000 mg | ORAL_TABLET | Freq: Two times a day (BID) | ORAL | 3 refills | Status: AC

## 2024-01-13 NOTE — Patient Instructions (Signed)
 Description   INr-2.2; Continue taking warfarin 1 tablet daily except for 2 tablets on Mondays. Recheck INR 8 weeks Coumadin  Clinic [phone]

## 2024-01-13 NOTE — Progress Notes (Signed)
 Description   INr-2.2; Continue taking warfarin 1 tablet daily except for 2 tablets on Mondays. Recheck INR 8 weeks Coumadin  Clinic [phone]

## 2024-01-23 ENCOUNTER — Other Ambulatory Visit: Payer: Self-pay

## 2024-01-23 ENCOUNTER — Other Ambulatory Visit: Payer: Self-pay | Admitting: Gastroenterology

## 2024-01-23 ENCOUNTER — Telehealth: Payer: Self-pay | Admitting: Gastroenterology

## 2024-01-23 MED ORDER — URSODIOL 300 MG PO CAPS: 600.0000 mg | ORAL_CAPSULE | Freq: Every day | ORAL | 1 refills | Status: DC

## 2024-01-23 NOTE — Telephone Encounter (Signed)
 Pt scheduled for follow up/med refill 12/11 @ 1:20 pm. Rx sent to get her to appt. Pt advised she will need to come to the appt in order to receive additional refills.

## 2024-01-23 NOTE — Telephone Encounter (Signed)
 Patient requesting medication refill for ursodiol . Advised patient to scheduled ov since she was last seen a year ago. Patient declined. Please review and advise on scheduling.

## 2024-03-09 ENCOUNTER — Ambulatory Visit: Attending: Cardiology | Admitting: *Deleted

## 2024-03-09 DIAGNOSIS — Z5181 Encounter for therapeutic drug level monitoring: Secondary | ICD-10-CM

## 2024-03-09 DIAGNOSIS — I480 Paroxysmal atrial fibrillation: Secondary | ICD-10-CM

## 2024-03-09 LAB — POCT INR: INR: 2.3 (ref 2.0–3.0)

## 2024-03-09 NOTE — Patient Instructions (Signed)
 Description   INR-2.3; Continue taking warfarin 1 tablet daily except for 2 tablets on Mondays. Recheck INR 8 weeks Coumadin  Clinic [phone]

## 2024-03-09 NOTE — Progress Notes (Signed)
 Description   INR-2.3; Continue taking warfarin 1 tablet daily except for 2 tablets on Mondays. Recheck INR 8 weeks Coumadin  Clinic [phone]

## 2024-03-15 ENCOUNTER — Other Ambulatory Visit: Payer: Self-pay | Admitting: Gastroenterology

## 2024-03-18 ENCOUNTER — Encounter: Payer: Self-pay | Admitting: Gastroenterology

## 2024-03-18 ENCOUNTER — Ambulatory Visit: Admitting: Gastroenterology

## 2024-03-18 VITALS — BP 150/60 | HR 65 | Ht 59.0 in | Wt 223.5 lb

## 2024-03-18 DIAGNOSIS — K8050 Calculus of bile duct without cholangitis or cholecystitis without obstruction: Secondary | ICD-10-CM | POA: Diagnosis not present

## 2024-03-18 DIAGNOSIS — R194 Change in bowel habit: Secondary | ICD-10-CM | POA: Diagnosis not present

## 2024-03-18 MED ORDER — URSODIOL 300 MG PO CAPS: 600.0000 mg | ORAL_CAPSULE | Freq: Every day | ORAL | 3 refills | Status: AC

## 2024-03-18 NOTE — Patient Instructions (Addendum)
 We have sent the following medications to your pharmacy for you to pick up at your convenience: Ursodiol    Thank you for trusting me with your gastrointestinal care!    Dr. Victory Legrand [facility]

## 2024-03-18 NOTE — Progress Notes (Signed)
 Advance GI Progress Note  Chief Complaint: Recurrent choledocholithiasis  Subjective  Prior history  Prior cholecystectomy, recurrent choledocholithiasis +/- cholangitis May and November 2022.  Required ERCP on both occasions.  On both occasions, the normalization of LFTs took several weeks.  January 2023 started ursodiol  after office visit. March 2023, colonoscopy for heme positive stool.  Advanced adenoma removed piecemeal from ICV.  Suspicious ascending colon polyp, biopsy revealed adenocarcinoma.  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 March 2024 surveillance colonoscopy: Diminutive polypoid tissue at ileocolonic anastomosis (normal pathology) -diverticulosis, internal hemorrhoids  Family history of colon cancer   Atrial fibrillation on chronic warfarin therapy. History of Present Illness  [name] is doing well since I last saw her for her colonoscopy in March 2024. Taking ursodiol  600 mg once daily, no recurrence of epigastric/right upper quadrant pain, no hospitalizations for choledocholithiasis/cholangitis for 3 years now.  Bowel habits still variable, sometimes loose or urgent, other times constipation.  Does her best getting increased dietary fiber.  Mobility limited, using a walker now, sometimes has pain lower back radiating to right hip (encouraged to talk with primary care about that).   ROS: Cardiovascular:  no chest pain Respiratory: no dyspnea  The patient's Past Medical, Family and Social History were reviewed and are on file in the EMR. Past Medical History:  Diagnosis Date   Anemia    Asthma    Borderline hyperlipidemia    Borderline hypertension    Cancer (HCC)    Complication of anesthesia    PONV   Dysrhythmia    Hypertension    Hypothyroid    s/p PTU therapy   Hypothyroidism 09/15/2008   Qualifier: Diagnosis of  By: Lavona, MD, CODY Lynwood SHAD (paroxysmal atrial fibrillation) (HCC)    PONV  (postoperative nausea and vomiting)     Past Surgical History:  Procedure Laterality Date   BILIARY DILATION  07/14/2020   Procedure: BILIARY DILATION;  Surgeon: Teressa Toribio SQUIBB, MD;  Location: [facility] ENDOSCOPY;  Service: Endoscopy;;   CESAREAN SECTION     CHOLECYSTECTOMY     ENDOSCOPIC RETROGRADE CHOLANGIOPANCREATOGRAPHY (ERCP) WITH PROPOFOL  N/A 07/14/2020   Procedure: ENDOSCOPIC RETROGRADE CHOLANGIOPANCREATOGRAPHY (ERCP) WITH PROPOFOL ;  Surgeon: Teressa Toribio SQUIBB, MD;  Location: [facility] ENDOSCOPY;  Service: Endoscopy;  Laterality: N/A;   ENDOSCOPIC RETROGRADE CHOLANGIOPANCREATOGRAPHY (ERCP) WITH PROPOFOL  N/A 03/15/2021   Procedure: ENDOSCOPIC RETROGRADE CHOLANGIOPANCREATOGRAPHY (ERCP) WITH PROPOFOL ;  Surgeon: Avram Lupita BRAVO, MD;  Location: [facility] ENDOSCOPY;  Service: Endoscopy;  Laterality: N/A;   ESOPHAGOGASTRODUODENOSCOPY (EGD) WITH PROPOFOL  N/A 07/14/2020   Procedure: ESOPHAGOGASTRODUODENOSCOPY (EGD) WITH PROPOFOL ;  Surgeon: Teressa Toribio SQUIBB, MD;  Location: [facility] ENDOSCOPY;  Service: Endoscopy;  Laterality: N/A;   KNEE SURGERY     right 2014, left 2015, arthroscipic   LEG SURGERY     Left leg d/t  tumor (benign)   REMOVAL OF STONES  07/14/2020   Procedure: REMOVAL OF STONES;  Surgeon: Teressa Toribio SQUIBB, MD;  Location: [facility] ENDOSCOPY;  Service: Endoscopy;;   RIGHT COLECTOMY     SPHINCTEROTOMY  07/14/2020   Procedure: ANNETT;  Surgeon: Teressa Toribio SQUIBB, MD;  Location[facility] ENDOSCOPY;  Service: Endoscopy;;   THYROID  SURGERY     UPPER ESOPHAGEAL ENDOSCOPIC ULTRASOUND (EUS) N/A 07/14/2020   Procedure: UPPER ESOPHAGEAL ENDOSCOPIC ULTRASOUND (EUS);  Surgeon: Teressa Toribio SQUIBB, MD;  Location: [facility] ENDOSCOPY;  Service: Endoscopy;  Laterality: N/A;     Objective:  Med list reviewed Current Medications[1] Flecainide , omeprazole, ursodiol  (600  mg daily), warfarin  Vital signs in last 24 hrs: Vitals:   03/18/24 1310  BP: (!) 150/60  Pulse: 65   Wt Readings from Last 3 Encounters:  03/18/24 223 lb  8 oz (101.4 kg)  11/24/23 222 lb (100.7 kg)  09/13/23 222 lb (100.7 kg)    Physical Exam  Limited mobility, was able to get on the low exam table.  Ambulates slowly with a walker HEENT: sclera anicteric, oral mucosa moist without lesions Cardiac: Regular,  no peripheral edema Pulm: clear to auscultation bilaterally, normal RR and effort noted Abdomen: soft, no tenderness, with active bowel sounds. No guarding or palpable hepatosplenomegaly.  (Limited by body habitus) Skin; warm and dry, no jaundice or rash   Labs:  INR appears to be consistently in the low twos when checked by primary care ___________________________________________ Radiologic studies:   ____________________________________________ Other:   _____________________________________________   Encounter Diagnoses  Name Primary?   Choledocholithiasis Yes   Altered bowel habits     Assessment & Plan  Continue ursodiol  600 mg once daily (90-day prescription sent with 3 refills See me in 2 years, call sooner if needed    Victory LITTIE Brand III     [1]  Current Outpatient Medications:    flecainide  (TAMBOCOR ) 100 MG tablet, Take 1 tablet (100 mg total) by mouth 2 (two) times daily., Disp: 180 tablet, Rfl: 3   omeprazole (PRILOSEC) 10 MG capsule, Take 10 mg by mouth daily as needed (acid reflux)., Disp: , Rfl:    warfarin (COUMADIN ) 5 MG tablet, TAKE 1 TO 2 TABLETS BY MOUTH ONCE DAILY OR AS DIRECTED BY THE COUMADIN  CLINIC, Disp: 120 tablet, Rfl: 1   ursodiol  (ACTIGALL ) 300 MG capsule, Take 2 capsules (600 mg total) by mouth daily., Disp: 180 capsule, Rfl: 3

## 2024-05-04 ENCOUNTER — Ambulatory Visit

## 2024-05-06 ENCOUNTER — Ambulatory Visit

## 2024-05-07 ENCOUNTER — Other Ambulatory Visit: Payer: Self-pay | Admitting: Cardiology

## 2024-05-07 DIAGNOSIS — I480 Paroxysmal atrial fibrillation: Secondary | ICD-10-CM

## 2024-05-07 NOTE — Telephone Encounter (Signed)
 Warfarin 5mg  Dx-Afib Last INR Check-03/09/24 & was due 05/04/24 r/s next appt to 05/13/24 due to weather Last OV- 11/24/23

## 2024-05-13 ENCOUNTER — Ambulatory Visit

## 2024-05-20 ENCOUNTER — Ambulatory Visit
